# Patient Record
Sex: Female | Born: 1955 | Race: White | Hispanic: No | Marital: Married | Smoking: Former smoker
Health system: Southern US, Community
[De-identification: ages and names within clinical notes are randomized; demographics above are authoritative.]

## PROBLEM LIST (undated history)

## (undated) DIAGNOSIS — D649 Anemia, unspecified: Secondary | ICD-10-CM

## (undated) DIAGNOSIS — J45909 Unspecified asthma, uncomplicated: Secondary | ICD-10-CM

## (undated) DIAGNOSIS — I10 Essential (primary) hypertension: Secondary | ICD-10-CM

## (undated) DIAGNOSIS — K7460 Unspecified cirrhosis of liver: Secondary | ICD-10-CM

## (undated) DIAGNOSIS — J90 Pleural effusion, not elsewhere classified: Secondary | ICD-10-CM

## (undated) HISTORY — PX: TUBAL LIGATION: SHX77

---

## 2018-01-21 ENCOUNTER — Other Ambulatory Visit: Payer: Self-pay | Admitting: Internal Medicine

## 2018-01-21 DIAGNOSIS — Z1231 Encounter for screening mammogram for malignant neoplasm of breast: Secondary | ICD-10-CM

## 2018-01-21 DIAGNOSIS — Z0000 Encounter for general adult medical examination without abnormal findings: Secondary | ICD-10-CM

## 2018-02-17 ENCOUNTER — Other Ambulatory Visit: Payer: Self-pay | Admitting: Family Medicine

## 2018-02-17 DIAGNOSIS — R0602 Shortness of breath: Secondary | ICD-10-CM

## 2018-02-17 DIAGNOSIS — J90 Pleural effusion, not elsewhere classified: Secondary | ICD-10-CM

## 2018-02-17 DIAGNOSIS — R05 Cough: Secondary | ICD-10-CM

## 2018-02-17 DIAGNOSIS — R059 Cough, unspecified: Secondary | ICD-10-CM

## 2018-02-18 ENCOUNTER — Ambulatory Visit
Admission: RE | Admit: 2018-02-18 | Discharge: 2018-02-18 | Disposition: A | Discharge status: Home / Self Care | Payer: Medicare (Managed Care) | Source: Ambulatory Visit | Attending: Family Medicine | Admitting: Family Medicine

## 2018-02-18 DIAGNOSIS — R05 Cough: Secondary | ICD-10-CM | POA: Diagnosis present

## 2018-02-18 DIAGNOSIS — J90 Pleural effusion, not elsewhere classified: Secondary | ICD-10-CM | POA: Diagnosis not present

## 2018-02-18 DIAGNOSIS — R059 Cough, unspecified: Secondary | ICD-10-CM

## 2018-02-18 DIAGNOSIS — R0602 Shortness of breath: Secondary | ICD-10-CM | POA: Diagnosis present

## 2018-02-18 HISTORY — DX: Unspecified asthma, uncomplicated: J45.909

## 2018-02-18 MED ORDER — IOPAMIDOL (ISOVUE-300) INJECTION 61%
60.0000 mL | Freq: Once | INTRAVENOUS | Status: AC | PRN
  Administered 2018-02-18: 60 mL via INTRAVENOUS

## 2018-02-19 ENCOUNTER — Other Ambulatory Visit: Payer: Self-pay

## 2018-02-19 ENCOUNTER — Other Ambulatory Visit: Payer: Self-pay | Admitting: Family Medicine

## 2018-02-19 ENCOUNTER — Emergency Department
Admission: EM | Admit: 2018-02-19 | Discharge: 2018-02-20 | Disposition: A | Discharge status: Home / Self Care | Payer: Medicare (Managed Care) | Attending: Emergency Medicine | Admitting: Emergency Medicine

## 2018-02-19 ENCOUNTER — Ambulatory Visit
Admission: RE | Admit: 2018-02-19 | Discharge: 2018-02-19 | Disposition: A | Discharge status: Home / Self Care | Payer: Medicare (Managed Care) | Source: Ambulatory Visit | Attending: Interventional Radiology | Admitting: Interventional Radiology

## 2018-02-19 ENCOUNTER — Emergency Department: Payer: Medicare (Managed Care)

## 2018-02-19 ENCOUNTER — Encounter: Payer: Self-pay | Admitting: Emergency Medicine

## 2018-02-19 ENCOUNTER — Other Ambulatory Visit
Admission: RE | Admit: 2018-02-19 | Discharge: 2018-02-19 | Disposition: A | Discharge status: Home / Self Care | Payer: Medicare (Managed Care) | Source: Ambulatory Visit | Attending: Family Medicine | Admitting: Family Medicine

## 2018-02-19 ENCOUNTER — Ambulatory Visit
Admission: RE | Admit: 2018-02-19 | Discharge: 2018-02-19 | Disposition: A | Discharge status: Home / Self Care | Payer: Medicare (Managed Care) | Source: Ambulatory Visit | Attending: Family Medicine | Admitting: Family Medicine

## 2018-02-19 DIAGNOSIS — J90 Pleural effusion, not elsewhere classified: Secondary | ICD-10-CM

## 2018-02-19 DIAGNOSIS — I10 Essential (primary) hypertension: Secondary | ICD-10-CM | POA: Diagnosis not present

## 2018-02-19 DIAGNOSIS — J45909 Unspecified asthma, uncomplicated: Secondary | ICD-10-CM | POA: Diagnosis not present

## 2018-02-19 DIAGNOSIS — Z87891 Personal history of nicotine dependence: Secondary | ICD-10-CM | POA: Insufficient documentation

## 2018-02-19 DIAGNOSIS — R091 Pleurisy: Secondary | ICD-10-CM | POA: Diagnosis not present

## 2018-02-19 DIAGNOSIS — Z9889 Other specified postprocedural states: Secondary | ICD-10-CM | POA: Diagnosis present

## 2018-02-19 DIAGNOSIS — R079 Chest pain, unspecified: Secondary | ICD-10-CM | POA: Diagnosis present

## 2018-02-19 HISTORY — DX: Pleural effusion, not elsewhere classified: J90

## 2018-02-19 HISTORY — DX: Anemia, unspecified: D64.9

## 2018-02-19 HISTORY — DX: Essential (primary) hypertension: I10

## 2018-02-19 LAB — CBC WITH DIFFERENTIAL/PLATELET
Abs Immature Granulocytes: 0.02 10*3/uL (ref 0.00–0.07)
BASOS ABS: 0.1 10*3/uL (ref 0.0–0.1)
Basophils Relative: 1 %
Eosinophils Absolute: 0.2 10*3/uL (ref 0.0–0.5)
Eosinophils Relative: 3 %
HCT: 29.2 % — ABNORMAL LOW (ref 36.0–46.0)
Hemoglobin: 10 g/dL — ABNORMAL LOW (ref 12.0–15.0)
Immature Granulocytes: 0 %
Lymphocytes Relative: 7 %
Lymphs Abs: 0.5 10*3/uL — ABNORMAL LOW (ref 0.7–4.0)
MCH: 33.9 pg (ref 26.0–34.0)
MCHC: 34.2 g/dL (ref 30.0–36.0)
MCV: 99 fL (ref 80.0–100.0)
Monocytes Absolute: 0.6 10*3/uL (ref 0.1–1.0)
Monocytes Relative: 8 %
NEUTROS ABS: 6.1 10*3/uL (ref 1.7–7.7)
Neutrophils Relative %: 81 %
Platelets: 112 10*3/uL — ABNORMAL LOW (ref 150–400)
RBC: 2.95 MIL/uL — AB (ref 3.87–5.11)
RDW: 17.3 % — ABNORMAL HIGH (ref 11.5–15.5)
WBC: 7.5 10*3/uL (ref 4.0–10.5)
nRBC: 0 % (ref 0.0–0.2)

## 2018-02-19 LAB — TROPONIN I

## 2018-02-19 LAB — BILIRUBIN, TOTAL: Total Bilirubin: 2.8 mg/dL — ABNORMAL HIGH (ref 0.3–1.2)

## 2018-02-19 LAB — BASIC METABOLIC PANEL
ANION GAP: 5 (ref 5–15)
BUN: 19 mg/dL (ref 6–20)
CO2: 26 mmol/L (ref 22–32)
Calcium: 9.4 mg/dL (ref 8.9–10.3)
Chloride: 103 mmol/L (ref 98–111)
Creatinine, Ser: 1.6 mg/dL — ABNORMAL HIGH (ref 0.44–1.00)
GFR calc non Af Amer: 35 mL/min — ABNORMAL LOW (ref >60)
GFR, EST AFRICAN AMERICAN: 40 mL/min — AB (ref >60)
Glucose, Bld: 90 mg/dL (ref 70–99)
Potassium: 4.1 mmol/L (ref 3.5–5.1)
Sodium: 134 mmol/L — ABNORMAL LOW (ref 135–145)

## 2018-02-19 LAB — BODY FLUID CELL COUNT WITH DIFFERENTIAL
Eos, Fluid: 1 %
Lymphs, Fluid: 79 %
Monocyte-Macrophage-Serous Fluid: 9 %
Neutrophil Count, Fluid: 11 %
Total Nucleated Cell Count, Fluid: 301 cu mm

## 2018-02-19 LAB — ALBUMIN, PLEURAL OR PERITONEAL FLUID: Albumin, Fluid: 2.2 g/dL

## 2018-02-19 LAB — LACTATE DEHYDROGENASE, PLEURAL OR PERITONEAL FLUID: LD, Fluid: 95 U/L — ABNORMAL HIGH (ref 3–23)

## 2018-02-19 LAB — LACTATE DEHYDROGENASE: LDH: 173 U/L (ref 98–192)

## 2018-02-19 LAB — GLUCOSE, PLEURAL OR PERITONEAL FLUID: Glucose, Fluid: 95 mg/dL

## 2018-02-19 LAB — PROTEIN, PLEURAL OR PERITONEAL FLUID: Total protein, fluid: 3.3 g/dL

## 2018-02-19 LAB — PROTEIN, TOTAL: Total Protein: 7 g/dL (ref 6.5–8.1)

## 2018-02-19 LAB — ALBUMIN: Albumin: 3.3 g/dL — ABNORMAL LOW (ref 3.5–5.0)

## 2018-02-19 MED ORDER — FENTANYL CITRATE (PF) 100 MCG/2ML IJ SOLN: 50.0000 ug | Freq: Once | INTRAMUSCULAR | Status: DC

## 2018-02-19 NOTE — ED Notes (Signed)
Patient transported to X-ray 

## 2018-02-19 NOTE — ED Triage Notes (Signed)
Pt presents from home via [facility] with c/o pain at site of incision site of left sided thoracentesis that was performed earlier today. Pt states pain became unbearable at home and she had to call 911. No redness noted to area around incision site, but area is very tender to touch. Pt unable to roll onto left side due to pain. VSS for ems, BS 126. Pt currently a&o x4 at this time. Pt denies any shortness of breath at this time. Pt has hx of anemia and pleural effusion.

## 2018-02-19 NOTE — ED Provider Notes (Signed)
[facility] Emergency Department Provider Note  ____________________________________________   First MD Initiated Contact with Patient 02/19/18 2154     (approximate)  I have reviewed the triage vital signs and the nursing notes.   HISTORY  Chief Complaint Chest Pain and Post-op Problem   HPI Kelly Mclean is a 63 y.o. female with a history of hypertension as well as a pleural effusion who had a thoracentesis earlier today.  She says that several hours after having the thoracentesis she began having sharp left-sided thoracic pain.  She says that she thinks that the pain medication is wearing off.  Says that it hurts worse to lie on that side as well as take a deep breath.  Does the pain is a 4-5 out of 10 at this time.  Says that she is not sure yet why the effusion is there.  Says that this was the purpose of the thoracentesis.   Past Medical History:  Diagnosis Date  . Anemia   . Asthma   . Hypertension   . Pleural effusion     There are no active problems to display for this patient.   Past Surgical History:  Procedure Laterality Date  . TUBAL LIGATION      Prior to Admission medications   Not on File    Allergies Penicillins  History reviewed. No pertinent family history.  Social History Social History   Tobacco Use  . Smoking status: Former Smoker    Last attempt to quit: 1990    Years since quitting: 30.0  . Smokeless tobacco: Never Used  Substance Use Topics  . Alcohol use: Not Currently  . Drug use: Not Currently    Review of Systems  Constitutional: No fever/chills Eyes: No visual changes. ENT: No sore throat. Cardiovascular: As above Respiratory: As above Gastrointestinal: No abdominal pain.  No nausea, no vomiting.  No diarrhea.  No constipation. Genitourinary: Negative for dysuria. Musculoskeletal: Negative for back pain. Skin: Negative for rash. Neurological: Negative for headaches, focal weakness or  numbness.   ____________________________________________   PHYSICAL EXAM:  VITAL SIGNS: ED Triage Vitals  Enc Vitals Group     BP --      Pulse --      Resp --      Temp 02/19/18 2145 98.1 F (36.7 C)     Temp Source 02/19/18 2145 Oral     SpO2 --      Weight 02/19/18 2146 160 lb (72.6 kg)     Height 02/19/18 2146 5' 3"  (1.6 m)     Head Circumference --      Peak Flow --      Pain Score 02/19/18 2145 5     Pain Loc --      Pain Edu? --      Excl. in [place]? --     Constitutional: Alert and oriented.  No acute distress.  Lying on her right side to avoid lying on the left hemithorax. Eyes: Conjunctivae are normal.  Head: Atraumatic. Nose: No congestion/rhinnorhea. Mouth/Throat: Mucous membranes are moist.  Neck: No stridor.   Cardiovascular: Normal rate, regular rhythm. Grossly normal heart sounds.  Respiratory: Normal respiratory effort.  No retractions. Lungs CTAB.  Left thoracentesis site with punctate needle mark without any surrounding tenderness to palpation, erythema, exudate or fluctuance.  No surrounding bruising.  Bandages CDI. Gastrointestinal: Soft and nontender. No distention.  Musculoskeletal: No lower extremity tenderness nor edema.  No joint effusions. Neurologic:  Normal speech and language.  No gross focal neurologic deficits are appreciated. Skin:  Skin is warm, dry and intact. No rash noted. Psychiatric: Mood and affect are normal. Speech and behavior are normal.  ____________________________________________   LABS (all labs ordered are listed, but only abnormal results are displayed)  Labs Reviewed  CBC WITH DIFFERENTIAL/PLATELET - Abnormal; Notable for the following components:      Result Value   RBC 2.95 (*)    Hemoglobin 10.0 (*)    HCT 29.2 (*)    RDW 17.3 (*)    Platelets 112 (*)    Lymphs Abs 0.5 (*)    All other components within normal limits  BASIC METABOLIC PANEL - Abnormal; Notable for the following components:   Sodium 134 (*)     Creatinine, Ser 1.60 (*)    GFR calc non Af Amer 35 (*)    GFR calc Af Amer 40 (*)    All other components within normal limits  TROPONIN I   ____________________________________________  EKG  ED ECG REPORT I, Doran Stabler, the attending physician, personally viewed and interpreted this ECG.   Date: 02/19/2018  EKG Time: 2148  Rate: 69  Rhythm: normal sinus rhythm  Axis: Normal  Intervals:none  ST&T Change: No ST segment elevation or depression.  No abnormal T wave inversion.  ____________________________________________  RADIOLOGY  Chest x-ray left-sided pleural effusion with increased hazy attenuation of the left midlung zone which may be due to layering of the effusion posteriorly or increased in effusion size. ____________________________________________   PROCEDURES  Procedure(s) performed:   Procedures  Critical Care performed:   ____________________________________________   INITIAL IMPRESSION / ASSESSMENT AND PLAN / ED COURSE  Pertinent labs & imaging results that were available during my care of the patient were reviewed by me and considered in my medical decision making (see chart for details).  Differential diagnosis includes, but is not limited to, ACS, aortic dissection, pulmonary embolism, cardiac tamponade, pneumothorax, pneumonia, pericarditis, myocarditis, GI-related causes including esophagitis/gastritis, and musculoskeletal chest wall pain.   As part of my medical decision making, I reviewed the following data within the [facility] records from procedure earlier today as well as outpatient notes.  ----------------------------------------- 10:28 PM on 02/19/2018 -----------------------------------------  Discussed case with Dr. Raul Del.  We reviewed the x-ray as well as the pleural fluid results.  Dr. Raul Del does not recommend any acute intervention at this time beyond pain control.  Recommends following up as  planned with the patient's primary care doctor at the [facility].  Also recommends follow-up with pulmonology.  ----------------------------------------- 10:51 PM on 02/19/2018 -----------------------------------------  Discussed results as well as discharge with patient.  She is saying that she is unable to get out of bed because of the pain.  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  She says that her husband is unable to take care of her at home as well and she would like to seek consultation with the social worker to explore options such as home care or rehab placement.  Chest pain is likely pleurisy status post thoracentesis.  No acute issue identified on the work-up at this time. ____________________________________________   FINAL CLINICAL IMPRESSION(S) / ED DIAGNOSES  Pleurisy.  NEW MEDICATIONS STARTED DURING THIS VISIT:  New Prescriptions   No medications on file     Note:  This document was prepared using Dragon voice recognition software  and may include unintentional dictation errors.     Orbie Pyo, MD 02/19/18 [phone]

## 2018-02-19 NOTE — ED Notes (Signed)
Pt informed this RN of hx of drug and alcohol abuse. Pt requesting not to be given narcotics due to hx. MD aware.

## 2018-02-20 ENCOUNTER — Emergency Department: Payer: Medicare (Managed Care)

## 2018-02-20 LAB — PH, BODY FLUID: pH, Body Fluid: 7.7

## 2018-02-20 LAB — CYTOLOGY - NON PAP

## 2018-02-20 MED ORDER — LIDOCAINE 5 % EX PTCH: 1.0000 | MEDICATED_PATCH | Freq: Two times a day (BID) | CUTANEOUS | 2 refills | Status: DC

## 2018-02-20 MED ORDER — ATENOLOL 25 MG PO TABS
12.5000 mg | ORAL_TABLET | Freq: Every day | ORAL | Status: DC
  Filled 2018-02-20: qty 1

## 2018-02-20 MED ORDER — BUSPIRONE HCL 5 MG PO TABS
10.0000 mg | ORAL_TABLET | Freq: Three times a day (TID) | ORAL | Status: DC
  Administered 2018-02-20: 10 mg via ORAL
  Filled 2018-02-20: qty 2

## 2018-02-20 MED ORDER — CALCIUM CARBONATE ANTACID 500 MG PO CHEW
2.0000 | CHEWABLE_TABLET | Freq: Two times a day (BID) | ORAL | Status: DC
  Administered 2018-02-20 (×2): 400 mg via ORAL
  Filled 2018-02-20 (×3): qty 2

## 2018-02-20 MED ORDER — PRIMIDONE 50 MG PO TABS
50.0000 mg | ORAL_TABLET | Freq: Two times a day (BID) | ORAL | Status: DC
  Administered 2018-02-20 (×2): 50 mg via ORAL
  Filled 2018-02-20 (×4): qty 1

## 2018-02-20 MED ORDER — VITAMIN B-12 1000 MCG PO TABS
1000.0000 ug | ORAL_TABLET | Freq: Every day | ORAL | Status: DC
  Administered 2018-02-20: 1000 ug via ORAL
  Filled 2018-02-20: qty 1

## 2018-02-20 MED ORDER — VITAMIN D 25 MCG (1000 UNIT) PO TABS
1000.0000 [IU] | ORAL_TABLET | Freq: Every day | ORAL | Status: DC
  Administered 2018-02-20: 1000 [IU] via ORAL
  Filled 2018-02-20: qty 1

## 2018-02-20 MED ORDER — FERROUS SULFATE 325 (65 FE) MG PO TABS
325.0000 mg | ORAL_TABLET | Freq: Two times a day (BID) | ORAL | Status: DC
  Administered 2018-02-20 (×2): 325 mg via ORAL
  Filled 2018-02-20 (×4): qty 1

## 2018-02-20 MED ORDER — GABAPENTIN 100 MG PO CAPS
200.0000 mg | ORAL_CAPSULE | Freq: Every day | ORAL | Status: DC
  Administered 2018-02-20: 200 mg via ORAL
  Filled 2018-02-20 (×3): qty 2

## 2018-02-20 MED ORDER — FAMOTIDINE 20 MG PO TABS
40.0000 mg | ORAL_TABLET | Freq: Every day | ORAL | Status: DC
  Administered 2018-02-20: 40 mg via ORAL
  Filled 2018-02-20 (×2): qty 2

## 2018-02-20 MED ORDER — MONTELUKAST SODIUM 10 MG PO TABS
10.0000 mg | ORAL_TABLET | Freq: Every day | ORAL | Status: DC
  Administered 2018-02-20: 10 mg via ORAL
  Filled 2018-02-20 (×3): qty 1

## 2018-02-20 MED ORDER — LISINOPRIL 5 MG PO TABS
2.5000 mg | ORAL_TABLET | Freq: Every day | ORAL | Status: DC
  Administered 2018-02-20: 2.5 mg via ORAL
  Filled 2018-02-20: qty 1
  Filled 2018-02-20: qty 0.5

## 2018-02-20 MED ORDER — CITALOPRAM HYDROBROMIDE 20 MG PO TABS
20.0000 mg | ORAL_TABLET | Freq: Every day | ORAL | Status: DC
  Administered 2018-02-20: 20 mg via ORAL
  Filled 2018-02-20: qty 1

## 2018-02-20 NOTE — ED Notes (Signed)
Pt assisted up to commode with henry, rn.

## 2018-02-20 NOTE — Clinical Social Work Note (Signed)
Clinical Education officer, museum (CSW) received consult for "Nursing home placement." CSW staffed with EDP Dr. Cinda Quest. CSW met with patient at bedside, along with EDP. Patient from home and lives with husband. Per notes, patient has [facility] services with [facility]. Patient states she receives in home services from "[facility]" on Tuesdays and Thursdays. EDP to complete chest x-ray.   UPDATE: Patient to discharge home. RNCM Jeanna following patient for discharge needs. Per notes, [facility] provides home care aides on Mondays and Fridays for 4 hours per day, patient goes to [facility] on Tuesdays and Thursdays. [facility] to coordinate outpatient services and provide transport to home. CSW signing off as no further Social Work needs identified.   Kelly Mclean, Latanya Presser, [place] Worker-Emergency Department [phone]

## 2018-02-20 NOTE — Evaluation (Signed)
Physical Therapy Evaluation Patient Details Name: Kelly Mclean MRN: [id number] DOB: 12-16-1955 Today's Date: 02/20/2018   History of Present Illness  Patient is a 63 year old female admitted from home with left flank pain. Patient had thoracentesis earlier in the day. PMH to include HTN, pleural effusion.    Clinical Impression  Patient received in bed, reports she is very tired. Reports no pain while lying still on her right side. With mobility pain increases to moderate level. Patient requires increased time to perform all mobility activities. Patient requires min assist with supine to sit to raise trunk from bed. Patient then able to ambulate 30 feet in the room without AD, min guard. Patient has significantly decreased cadence. She will benefit from continued skilled PT to improve strength, functional independence and to decrease pain.     Follow Up Recommendations SNF;Home health PT(dependet on progression)    Equipment Recommendations  None recommended by PT    Recommendations for Other Services       Precautions / Restrictions Precautions Precautions: None      Mobility  Bed Mobility Overal bed mobility: Needs Assistance Bed Mobility: Sidelying to Sit   Sidelying to sit: Min assist       General bed mobility comments: requires assist for raising trunk to sitting position.   Transfers Overall transfer level: Needs assistance Equipment used: None Transfers: Sit to/from Stand Sit to Stand: Min guard            Ambulation/Gait Ambulation/Gait assistance: Modified independent (Device/Increase time);Min guard Gait Distance (Feet): 30 Feet Assistive device: None Gait Pattern/deviations: Step-to pattern;Shuffle Gait velocity: decreased      Stairs            Wheelchair Mobility    Modified Rankin (Stroke Patients Only)       Balance Overall balance assessment: Modified Independent;Mild deficits observed, not formally tested                                            Pertinent Vitals/Pain Pain Assessment: 0-10 Pain Score: 8  Pain Location: left side Pain Descriptors / Indicators: Sharp;Jabbing Pain Intervention(s): Limited activity within patient's tolerance;Monitored during session    Home Living Family/patient expects to be discharged to:: Private residence Living Arrangements: Spouse/significant other   Type of Home: House Home Access: Level entry     Home Layout: One level Home Equipment: Environmental consultant - 4 wheels Additional Comments: reports she has a walker but does not use it    Prior Function                 Hand Dominance        Extremity/Trunk Assessment   Upper Extremity Assessment Upper Extremity Assessment: Generalized weakness    Lower Extremity Assessment Lower Extremity Assessment: Generalized weakness       Communication   Communication: No difficulties  Cognition Arousal/Alertness: Awake/alert Behavior During Therapy: WFL for tasks assessed/performed Overall Cognitive Status: Within Functional Limits for tasks assessed                                        General Comments      Exercises     Assessment/Plan    PT Assessment Patient needs continued PT services  PT Problem List Decreased strength;Decreased activity tolerance;Decreased mobility;Pain  PT Treatment Interventions Therapeutic exercise;Gait training;Therapeutic activities;Patient/family education;Functional mobility training    PT Goals (Current goals can be found in the Care Plan section)  Acute Rehab PT Goals Patient Stated Goal: to decrease pain PT Goal Formulation: With patient Time For Goal Achievement: 02/27/18 Potential to Achieve Goals: Good    Frequency Min 2X/week   Barriers to discharge Decreased caregiver support reports her husband is at work, although mentions that the [facility] program could come get her if she is discharged. Reports she would like help  especially at night, therefore she may need SNF placement    Co-evaluation               AM-PAC PT "6 Clicks" Mobility  Outcome Measure Help needed turning from your back to your side while in a flat bed without using bedrails?: A Lot Help needed moving from lying on your back to sitting on the side of a flat bed without using bedrails?: A Little Help needed moving to and from a bed to a chair (including a wheelchair)?: A Little Help needed standing up from a chair using your arms (e.g., wheelchair or bedside chair)?: A Little Help needed to walk in hospital room?: A Little Help needed climbing 3-5 steps with a railing? : A Little 6 Click Score: 17    End of Session Equipment Utilized During Treatment: Gait belt Activity Tolerance: Patient limited by pain;Patient limited by lethargy Patient left: in bed;with call bell/phone within reach Nurse Communication: Mobility status PT Visit Diagnosis: Other abnormalities of gait and mobility (R26.89);Muscle weakness (generalized) (M62.81);Pain Pain - Right/Left: Left Pain - part of body: (side)    Time: 3437-3578 PT Time Calculation (min) (ACUTE ONLY): 24 min   Charges:   PT Evaluation $PT Eval Low Complexity: 1 Low PT Treatments $Gait Training: 8-22 mins        Naidelin Gugliotta, PT, GCS 02/20/18,10:29 AM

## 2018-02-20 NOTE — ED Notes (Signed)
This RN called [facility] transport to which the staff member stated that he was not aware that the pt was ready to be picked up. Staff member to send someone shortly to retrieve pt.

## 2018-02-20 NOTE — ED Notes (Signed)
Pharmacy called for 00:30 meds.

## 2018-02-20 NOTE — ED Provider Notes (Signed)
-----------------------------------------   5:46 AM on 02/20/2018 -----------------------------------------   Blood pressure (!) 120/42, pulse 77, temperature 98.1 F (36.7 C), temperature source Oral, resp. rate (!) 22, height 1.6 m (5' 3" ), weight 72.6 kg, SpO2 98 %.  The patient is calm and cooperative at this time.  There have been no acute events since the last update.  Awaiting disposition plan from PT/OT and social work.   Hinda Kehr, MD 02/20/18 [phone]

## 2018-02-20 NOTE — Discharge Instructions (Signed)
Repeat x-ray shows everything looks fairly good.  Not sure what is causing the pain.  He can try the Lidoderm patch.  Please follow-up with your regular doctor in the next few days to help keep an eye on this pleurisy that you appear to be having.  Please return here if you get worse pain fever shortness of breath or any other problems.

## 2018-02-20 NOTE — ED Notes (Signed)
Pt dressed and ready to be transported to [facility]. [facility] to come and pick pt up.

## 2018-02-20 NOTE — ED Notes (Signed)
Pt moved to hospital bed. Call bell at right side. Water and purse at bedside. Pt denies other needs.

## 2018-02-20 NOTE — ED Notes (Signed)
Report from annie, rn.

## 2018-02-20 NOTE — ED Notes (Signed)
dorletha from [facility] came and picked up the patient.  Instructions given.  Unable to bring chl up in room, so she did not sign.  Patient is up in wheelchair and ready to go.

## 2018-02-20 NOTE — Care Management Note (Signed)
Case Management Note  Patient Details  Name: Kelly Mclean MRN: [id number] Date of Birth: July 21, 1955  Subjective/Objective:      Patient is being seen in the ED for pleuritic chest pain and shortness of breath.  Patient has a thoracentesis and has been cleared for discharge.  Patient reports that she moved from [place] in March of 2019 in [place] she was part of the [facility] program.  Patient is part of the [facility] program now at [employer].  Patient lives with her husband.  [facility] provides home care aides on Mondays and Fridays for 4 hours per day, patient goes to [facility] on Tuesdays and Thursdays.  Patient receives all of her prescriptions and PCP services from [facility] and they provide her transportation.  RNCM contacted [facility] and notified them of patient's visit and that she will be discharged.  [facility] will send transport to pick patient up and carry her home.  Patients husband is currently at work.  Patient reports that she has a history of arthritis, cervical dystonia, fibromyalgia and IBS.  Patient has a rollator at home but reports she very rarely uses it.  [facility] will provide all outpatient needed services.  RNCM signed off. Doran Clay RN BSN [phone]               Action/Plan:   Expected Discharge Date:                  Expected Discharge Plan:  [facility])  In-House Referral:  Clinical Social Work  Discharge planning Services  CM Consult  Post Acute Care Choice:    Choice offered to:     DME Arranged:    DME Agency:     HH Arranged:  ([facility]) [place] Agency:     Status of Service:  Completed, signed off  If discussed at [employer] of Stay Meetings, dates discussed:    Additional Comments:  Shelbie Hutching, RN 02/20/2018, 11:57 AM

## 2018-02-20 NOTE — ED Notes (Signed)
Physical therapy at bedside

## 2018-02-20 NOTE — ED Notes (Signed)
Charge rn notified of need for hospital bed.

## 2018-02-20 NOTE — ED Provider Notes (Signed)
Patient reports pleuritic chest pain radiating from about the mid axillary line over toward the anterior chest under her breast on the left side.  He got worse after she had the thoracentesis.  She has had it before.  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  sHe says she cannot take Tylenol she is not sure why or NSAIDs.  I will attempt to give her a Lidoderm patch.  She says she had that before for hip pain and seem to work well.  We will repeat the chest x-ray now to make sure she has not developed a pneumothorax.  If that is negative we will give her the Lidoderm patch and we will try let her go.   Nena Polio, MD 02/20/18 1019

## 2018-02-20 NOTE — ED Notes (Signed)
Report to [place], rn.

## 2018-02-21 ENCOUNTER — Other Ambulatory Visit: Payer: Self-pay | Admitting: Family Medicine

## 2018-02-21 DIAGNOSIS — K7460 Unspecified cirrhosis of liver: Secondary | ICD-10-CM

## 2018-02-23 LAB — BODY FLUID CULTURE
Culture: NO GROWTH
Gram Stain: NONE SEEN

## 2018-02-26 LAB — TOTAL BILIRUBIN, BODY FLUID: Total bilirubin, fluid: 1.3 mg/dL

## 2018-03-04 ENCOUNTER — Ambulatory Visit
Admission: RE | Admit: 2018-03-04 | Discharge: 2018-03-04 | Disposition: A | Discharge status: Home / Self Care | Payer: Medicare (Managed Care) | Source: Ambulatory Visit | Attending: Family Medicine | Admitting: Family Medicine

## 2018-03-04 DIAGNOSIS — K7460 Unspecified cirrhosis of liver: Secondary | ICD-10-CM | POA: Insufficient documentation

## 2018-03-05 ENCOUNTER — Other Ambulatory Visit: Payer: Self-pay | Admitting: Internal Medicine

## 2018-03-05 ENCOUNTER — Other Ambulatory Visit (HOSPITAL_COMMUNITY): Payer: Self-pay | Admitting: Internal Medicine

## 2018-03-05 DIAGNOSIS — J90 Pleural effusion, not elsewhere classified: Secondary | ICD-10-CM

## 2018-03-06 ENCOUNTER — Other Ambulatory Visit: Payer: Self-pay | Admitting: Internal Medicine

## 2018-03-06 ENCOUNTER — Ambulatory Visit
Admission: RE | Admit: 2018-03-06 | Discharge: 2018-03-06 | Disposition: A | Discharge status: Home / Self Care | Payer: Medicare (Managed Care) | Source: Ambulatory Visit | Attending: Internal Medicine | Admitting: Internal Medicine

## 2018-03-06 ENCOUNTER — Ambulatory Visit
Admission: RE | Admit: 2018-03-06 | Discharge: 2018-03-06 | Disposition: A | Discharge status: Home / Self Care | Payer: Medicare (Managed Care) | Source: Ambulatory Visit | Attending: Interventional Radiology | Admitting: Interventional Radiology

## 2018-03-06 DIAGNOSIS — K8020 Calculus of gallbladder without cholecystitis without obstruction: Secondary | ICD-10-CM | POA: Insufficient documentation

## 2018-03-06 DIAGNOSIS — J90 Pleural effusion, not elsewhere classified: Secondary | ICD-10-CM | POA: Insufficient documentation

## 2018-03-06 DIAGNOSIS — D378 Neoplasm of uncertain behavior of other specified digestive organs: Secondary | ICD-10-CM

## 2018-03-06 DIAGNOSIS — K5710 Diverticulosis of small intestine without perforation or abscess without bleeding: Secondary | ICD-10-CM | POA: Diagnosis not present

## 2018-03-06 DIAGNOSIS — K8689 Other specified diseases of pancreas: Secondary | ICD-10-CM

## 2018-03-06 DIAGNOSIS — Z9889 Other specified postprocedural states: Secondary | ICD-10-CM | POA: Insufficient documentation

## 2018-03-06 MED ORDER — IOHEXOL 300 MG/ML  SOLN
100.0000 mL | Freq: Once | INTRAMUSCULAR | Status: AC | PRN
  Administered 2018-03-06: 100 mL via INTRAVENOUS

## 2018-03-06 NOTE — Procedures (Signed)
Pre procedural Dx: Symptomatic Pleural effusion Post procedural Dx: Same  Successful US guided left sided thoracentesis yielding 1.9 L of serous, amber colored pleural fluid.   Samples sent to lab for analysis.  EBL: None  Complications: None immediate.  Ronny Bacon, MD Pager #: [phone]

## 2018-03-06 NOTE — Progress Notes (Signed)
Asked to come and evaluate this radiology patient post thoracentesis per Dr Pascal Lux. Hr within normal limits. Patient alert and oriented. bp stable , warm and dry, not diaphoretic. Audible lung sounds unchanged. No rales nor rhonchi audible. Consulted with DR Pascal Lux, with CXR pending.

## 2018-03-11 ENCOUNTER — Ambulatory Visit
Admission: RE | Admit: 2018-03-11 | Discharge: 2018-03-11 | Disposition: A | Discharge status: Home / Self Care | Payer: Medicare (Managed Care) | Source: Ambulatory Visit | Attending: Internal Medicine | Admitting: Internal Medicine

## 2018-03-11 DIAGNOSIS — J9601 Acute respiratory failure with hypoxia: Secondary | ICD-10-CM | POA: Diagnosis present

## 2018-03-11 DIAGNOSIS — K766 Portal hypertension: Secondary | ICD-10-CM | POA: Diagnosis present

## 2018-03-11 DIAGNOSIS — Z66 Do not resuscitate: Secondary | ICD-10-CM | POA: Diagnosis present

## 2018-03-11 DIAGNOSIS — J918 Pleural effusion in other conditions classified elsewhere: Secondary | ICD-10-CM | POA: Diagnosis present

## 2018-03-11 DIAGNOSIS — Z87891 Personal history of nicotine dependence: Secondary | ICD-10-CM

## 2018-03-11 DIAGNOSIS — D696 Thrombocytopenia, unspecified: Secondary | ICD-10-CM | POA: Diagnosis present

## 2018-03-11 DIAGNOSIS — R0600 Dyspnea, unspecified: Secondary | ICD-10-CM | POA: Diagnosis not present

## 2018-03-11 DIAGNOSIS — Z880 Allergy status to penicillin: Secondary | ICD-10-CM

## 2018-03-11 DIAGNOSIS — Z881 Allergy status to other antibiotic agents status: Secondary | ICD-10-CM

## 2018-03-11 DIAGNOSIS — J45909 Unspecified asthma, uncomplicated: Secondary | ICD-10-CM | POA: Diagnosis present

## 2018-03-11 DIAGNOSIS — Z1231 Encounter for screening mammogram for malignant neoplasm of breast: Secondary | ICD-10-CM

## 2018-03-11 DIAGNOSIS — D72820 Lymphocytosis (symptomatic): Secondary | ICD-10-CM | POA: Diagnosis present

## 2018-03-11 DIAGNOSIS — N184 Chronic kidney disease, stage 4 (severe): Secondary | ICD-10-CM | POA: Diagnosis present

## 2018-03-11 DIAGNOSIS — J9811 Atelectasis: Secondary | ICD-10-CM | POA: Diagnosis present

## 2018-03-11 DIAGNOSIS — N179 Acute kidney failure, unspecified: Secondary | ICD-10-CM | POA: Diagnosis not present

## 2018-03-11 DIAGNOSIS — I959 Hypotension, unspecified: Secondary | ICD-10-CM | POA: Diagnosis not present

## 2018-03-11 DIAGNOSIS — K8020 Calculus of gallbladder without cholecystitis without obstruction: Secondary | ICD-10-CM | POA: Diagnosis present

## 2018-03-11 DIAGNOSIS — T502X5A Adverse effect of carbonic-anhydrase inhibitors, benzothiadiazides and other diuretics, initial encounter: Secondary | ICD-10-CM | POA: Diagnosis not present

## 2018-03-11 DIAGNOSIS — K7460 Unspecified cirrhosis of liver: Principal | ICD-10-CM | POA: Diagnosis present

## 2018-03-11 DIAGNOSIS — I129 Hypertensive chronic kidney disease with stage 1 through stage 4 chronic kidney disease, or unspecified chronic kidney disease: Secondary | ICD-10-CM | POA: Diagnosis present

## 2018-03-11 DIAGNOSIS — E875 Hyperkalemia: Secondary | ICD-10-CM | POA: Diagnosis present

## 2018-03-11 DIAGNOSIS — X58XXXA Exposure to other specified factors, initial encounter: Secondary | ICD-10-CM | POA: Diagnosis not present

## 2018-03-11 DIAGNOSIS — D638 Anemia in other chronic diseases classified elsewhere: Secondary | ICD-10-CM | POA: Diagnosis present

## 2018-03-11 DIAGNOSIS — J948 Other specified pleural conditions: Secondary | ICD-10-CM | POA: Diagnosis present

## 2018-03-11 DIAGNOSIS — Z0000 Encounter for general adult medical examination without abnormal findings: Secondary | ICD-10-CM

## 2018-03-11 DIAGNOSIS — R188 Other ascites: Secondary | ICD-10-CM | POA: Diagnosis present

## 2018-03-11 DIAGNOSIS — F329 Major depressive disorder, single episode, unspecified: Secondary | ICD-10-CM | POA: Diagnosis present

## 2018-03-11 DIAGNOSIS — F419 Anxiety disorder, unspecified: Secondary | ICD-10-CM | POA: Diagnosis present

## 2018-03-11 DIAGNOSIS — K7581 Nonalcoholic steatohepatitis (NASH): Secondary | ICD-10-CM | POA: Diagnosis present

## 2018-03-11 DIAGNOSIS — E739 Lactose intolerance, unspecified: Secondary | ICD-10-CM | POA: Diagnosis present

## 2018-03-11 DIAGNOSIS — Z79899 Other long term (current) drug therapy: Secondary | ICD-10-CM

## 2018-03-11 DIAGNOSIS — Z803 Family history of malignant neoplasm of breast: Secondary | ICD-10-CM

## 2018-03-14 ENCOUNTER — Inpatient Hospital Stay: Payer: Medicare (Managed Care)

## 2018-03-14 ENCOUNTER — Inpatient Hospital Stay
Admission: EM | Admit: 2018-03-14 | Discharge: 2018-03-28 | DRG: 432 | Disposition: A | Discharge status: Other Acute Inpatient Hospital | Payer: Medicare (Managed Care) | Attending: Internal Medicine | Admitting: Internal Medicine

## 2018-03-14 ENCOUNTER — Emergency Department: Payer: Medicare (Managed Care)

## 2018-03-14 ENCOUNTER — Other Ambulatory Visit: Payer: Self-pay

## 2018-03-14 DIAGNOSIS — R0600 Dyspnea, unspecified: Secondary | ICD-10-CM

## 2018-03-14 DIAGNOSIS — K7031 Alcoholic cirrhosis of liver with ascites: Secondary | ICD-10-CM

## 2018-03-14 DIAGNOSIS — K7581 Nonalcoholic steatohepatitis (NASH): Secondary | ICD-10-CM | POA: Diagnosis present

## 2018-03-14 DIAGNOSIS — R899 Unspecified abnormal finding in specimens from other organs, systems and tissues: Secondary | ICD-10-CM

## 2018-03-14 DIAGNOSIS — F419 Anxiety disorder, unspecified: Secondary | ICD-10-CM | POA: Diagnosis present

## 2018-03-14 DIAGNOSIS — R188 Other ascites: Secondary | ICD-10-CM | POA: Diagnosis present

## 2018-03-14 DIAGNOSIS — R0603 Acute respiratory distress: Secondary | ICD-10-CM

## 2018-03-14 DIAGNOSIS — J948 Other specified pleural conditions: Secondary | ICD-10-CM | POA: Diagnosis present

## 2018-03-14 DIAGNOSIS — E739 Lactose intolerance, unspecified: Secondary | ICD-10-CM | POA: Diagnosis present

## 2018-03-14 DIAGNOSIS — N179 Acute kidney failure, unspecified: Secondary | ICD-10-CM | POA: Diagnosis not present

## 2018-03-14 DIAGNOSIS — Z880 Allergy status to penicillin: Secondary | ICD-10-CM | POA: Diagnosis not present

## 2018-03-14 DIAGNOSIS — Z1231 Encounter for screening mammogram for malignant neoplasm of breast: Secondary | ICD-10-CM | POA: Diagnosis not present

## 2018-03-14 DIAGNOSIS — X58XXXA Exposure to other specified factors, initial encounter: Secondary | ICD-10-CM | POA: Diagnosis not present

## 2018-03-14 DIAGNOSIS — R0602 Shortness of breath: Secondary | ICD-10-CM

## 2018-03-14 DIAGNOSIS — K766 Portal hypertension: Secondary | ICD-10-CM | POA: Diagnosis present

## 2018-03-14 DIAGNOSIS — J9601 Acute respiratory failure with hypoxia: Secondary | ICD-10-CM | POA: Diagnosis present

## 2018-03-14 DIAGNOSIS — J9811 Atelectasis: Secondary | ICD-10-CM | POA: Diagnosis present

## 2018-03-14 DIAGNOSIS — Z9889 Other specified postprocedural states: Secondary | ICD-10-CM

## 2018-03-14 DIAGNOSIS — N184 Chronic kidney disease, stage 4 (severe): Secondary | ICD-10-CM | POA: Diagnosis present

## 2018-03-14 DIAGNOSIS — J918 Pleural effusion in other conditions classified elsewhere: Secondary | ICD-10-CM | POA: Diagnosis present

## 2018-03-14 DIAGNOSIS — Z87891 Personal history of nicotine dependence: Secondary | ICD-10-CM | POA: Diagnosis not present

## 2018-03-14 DIAGNOSIS — D72820 Lymphocytosis (symptomatic): Secondary | ICD-10-CM | POA: Diagnosis not present

## 2018-03-14 DIAGNOSIS — N183 Chronic kidney disease, stage 3 (moderate): Secondary | ICD-10-CM | POA: Diagnosis not present

## 2018-03-14 DIAGNOSIS — J9600 Acute respiratory failure, unspecified whether with hypoxia or hypercapnia: Secondary | ICD-10-CM | POA: Diagnosis present

## 2018-03-14 DIAGNOSIS — R0902 Hypoxemia: Secondary | ICD-10-CM

## 2018-03-14 DIAGNOSIS — I959 Hypotension, unspecified: Secondary | ICD-10-CM | POA: Diagnosis not present

## 2018-03-14 DIAGNOSIS — D696 Thrombocytopenia, unspecified: Secondary | ICD-10-CM | POA: Diagnosis not present

## 2018-03-14 DIAGNOSIS — K769 Liver disease, unspecified: Secondary | ICD-10-CM | POA: Diagnosis not present

## 2018-03-14 DIAGNOSIS — D649 Anemia, unspecified: Secondary | ICD-10-CM | POA: Diagnosis not present

## 2018-03-14 DIAGNOSIS — T502X5A Adverse effect of carbonic-anhydrase inhibitors, benzothiadiazides and other diuretics, initial encounter: Secondary | ICD-10-CM | POA: Diagnosis not present

## 2018-03-14 DIAGNOSIS — J90 Pleural effusion, not elsewhere classified: Secondary | ICD-10-CM

## 2018-03-14 DIAGNOSIS — J45909 Unspecified asthma, uncomplicated: Secondary | ICD-10-CM | POA: Diagnosis present

## 2018-03-14 DIAGNOSIS — I129 Hypertensive chronic kidney disease with stage 1 through stage 4 chronic kidney disease, or unspecified chronic kidney disease: Secondary | ICD-10-CM | POA: Diagnosis present

## 2018-03-14 DIAGNOSIS — F329 Major depressive disorder, single episode, unspecified: Secondary | ICD-10-CM | POA: Diagnosis present

## 2018-03-14 DIAGNOSIS — Z803 Family history of malignant neoplasm of breast: Secondary | ICD-10-CM | POA: Diagnosis not present

## 2018-03-14 DIAGNOSIS — Z881 Allergy status to other antibiotic agents status: Secondary | ICD-10-CM | POA: Diagnosis not present

## 2018-03-14 DIAGNOSIS — E875 Hyperkalemia: Secondary | ICD-10-CM | POA: Diagnosis present

## 2018-03-14 DIAGNOSIS — K7460 Unspecified cirrhosis of liver: Secondary | ICD-10-CM | POA: Diagnosis present

## 2018-03-14 DIAGNOSIS — Z79899 Other long term (current) drug therapy: Secondary | ICD-10-CM | POA: Diagnosis not present

## 2018-03-14 HISTORY — DX: Unspecified cirrhosis of liver: K74.60

## 2018-03-14 LAB — PROTEIN, PLEURAL OR PERITONEAL FLUID: Total protein, fluid: <3 g/dL

## 2018-03-14 LAB — CBC
HCT: 29.9 % — ABNORMAL LOW (ref 36.0–46.0)
Hemoglobin: 10.1 g/dL — ABNORMAL LOW (ref 12.0–15.0)
MCH: 33.9 pg (ref 26.0–34.0)
MCHC: 33.8 g/dL (ref 30.0–36.0)
MCV: 100.3 fL — ABNORMAL HIGH (ref 80.0–100.0)
Platelets: 105 10*3/uL — ABNORMAL LOW (ref 150–400)
RBC: 2.98 MIL/uL — ABNORMAL LOW (ref 3.87–5.11)
RDW: 15.9 % — ABNORMAL HIGH (ref 11.5–15.5)
WBC: 4.3 10*3/uL (ref 4.0–10.5)
nRBC: 0 % (ref 0.0–0.2)

## 2018-03-14 LAB — BASIC METABOLIC PANEL
Anion gap: 6 (ref 5–15)
BUN: 17 mg/dL (ref 8–23)
CO2: 27 mmol/L (ref 22–32)
Calcium: 9.2 mg/dL (ref 8.9–10.3)
Chloride: 94 mmol/L — ABNORMAL LOW (ref 98–111)
Creatinine, Ser: 1.79 mg/dL — ABNORMAL HIGH (ref 0.44–1.00)
GFR calc Af Amer: 35 mL/min — ABNORMAL LOW (ref >60)
GFR calc non Af Amer: 30 mL/min — ABNORMAL LOW (ref >60)
Glucose, Bld: 96 mg/dL (ref 70–99)
Potassium: 4.8 mmol/L (ref 3.5–5.1)
Sodium: 127 mmol/L — ABNORMAL LOW (ref 135–145)

## 2018-03-14 LAB — ALBUMIN, PLEURAL OR PERITONEAL FLUID: Albumin, Fluid: 1.4 g/dL

## 2018-03-14 LAB — PROTIME-INR
INR: 1.33
Prothrombin Time: 16.3 seconds — ABNORMAL HIGH (ref 11.4–15.2)

## 2018-03-14 LAB — GLUCOSE, PLEURAL OR PERITONEAL FLUID: Glucose, Fluid: 90 mg/dL

## 2018-03-14 LAB — APTT: aPTT: 32 seconds (ref 24–36)

## 2018-03-14 LAB — BRAIN NATRIURETIC PEPTIDE: B Natriuretic Peptide: 51 pg/mL (ref 0.0–100.0)

## 2018-03-14 MED ORDER — ACETAMINOPHEN 650 MG RE SUPP: 650.0000 mg | Freq: Four times a day (QID) | RECTAL | Status: DC | PRN

## 2018-03-14 MED ORDER — HYDROCODONE-ACETAMINOPHEN 5-325 MG PO TABS
1.0000 | ORAL_TABLET | ORAL | Status: DC | PRN
  Administered 2018-03-18: 1 via ORAL
  Filled 2018-03-14: qty 1
  Filled 2018-03-14: qty 2

## 2018-03-14 MED ORDER — DOCUSATE SODIUM 100 MG PO CAPS
200.0000 mg | ORAL_CAPSULE | Freq: Two times a day (BID) | ORAL | Status: DC
  Administered 2018-03-14: 200 mg via ORAL
  Filled 2018-03-14: qty 2

## 2018-03-14 MED ORDER — ONDANSETRON HCL 4 MG/2ML IJ SOLN: 4.0000 mg | Freq: Four times a day (QID) | INTRAMUSCULAR | Status: DC | PRN

## 2018-03-14 MED ORDER — ACETAMINOPHEN 325 MG PO TABS: 650.0000 mg | ORAL_TABLET | Freq: Four times a day (QID) | ORAL | Status: DC | PRN

## 2018-03-14 MED ORDER — ONDANSETRON HCL 4 MG PO TABS
4.0000 mg | ORAL_TABLET | Freq: Four times a day (QID) | ORAL | Status: DC | PRN
  Administered 2018-03-16 – 2018-03-23 (×2): 4 mg via ORAL
  Filled 2018-03-14 (×2): qty 1

## 2018-03-14 MED ORDER — POLYETHYLENE GLYCOL 3350 17 G PO PACK: 17.0000 g | PACK | Freq: Every day | ORAL | Status: DC | PRN

## 2018-03-14 MED ORDER — DOCUSATE SODIUM 100 MG PO CAPS
100.0000 mg | ORAL_CAPSULE | Freq: Two times a day (BID) | ORAL | Status: DC
  Administered 2018-03-15 – 2018-03-18 (×5): 100 mg via ORAL
  Filled 2018-03-14 (×7): qty 1

## 2018-03-14 MED ORDER — ENOXAPARIN SODIUM 30 MG/0.3ML ~~LOC~~ SOLN
30.0000 mg | SUBCUTANEOUS | Status: DC
  Administered 2018-03-14: 30 mg via SUBCUTANEOUS
  Filled 2018-03-14 (×2): qty 0.3

## 2018-03-14 NOTE — ED Notes (Signed)
Pt transported for thoracentesis

## 2018-03-14 NOTE — Progress Notes (Signed)
Notify Dr. Jannifer Franklin about patient's complaints of constipation, order for colace given. RN will continue to monitor.

## 2018-03-14 NOTE — ED Notes (Signed)
ED TO INPATIENT HANDOFF REPORT  Name/Age/Gender Kelly Mclean 63 y.o. female  Code Status    Code Status Orders  (From admission, onward)         Start     Ordered   03/14/18 1333  Full code  Continuous     03/14/18 1336        Code Status History    This patient has a current code status but no historical code status.    Advance Directive Documentation     Most Recent Value  Type of Advance Directive  Living will  Pre-existing out of facility DNR order (yellow form or pink MOST form)  -  "MOST" Form in Place?  -      Home/SNF/Other Home  Chief Complaint sob ems  Level of Care/Admitting Diagnosis ED Disposition    ED Disposition Condition [place]: [place] [100120]  Level of Care: Med-Surg [16]  Diagnosis: Acute respiratory failure ([place]) [518.81.ICD-9-CM]  Admitting Physician: Bettey Costa [[id number]]  Attending Physician: MODY, Ulice Bold [[id number]]  Estimated length of stay: past midnight tomorrow  Certification:: I certify this patient will need inpatient services for at least 2 midnights  PT Class (Do Not Modify): Inpatient [101]  PT Acc Code (Do Not Modify): Private [1]       Medical History Past Medical History:  Diagnosis Date  . Anemia   . Asthma   . Cirrhosis ([place])   . Hypertension   . Pleural effusion     Allergies Allergies  Allergen Reactions  . Biaxin [Clarithromycin] Shortness Of Breath and Rash  . Penicillins Anaphylaxis and Other (See Comments)    Did it involve swelling of the face/tongue/throat, SOB, or low BP? Unknown Did it involve sudden or severe rash/hives, skin peeling, or any reaction on the inside of your mouth or nose? Unknown Did you need to seek medical attention at a hospital or doctor's office? Yes When did it last happen?childhood If all above answers are "NO", may proceed with cephalosporin use.    Marland Kitchen Zoloft [Sertraline Hcl] Other (See Comments)    Reaction: "made  crazy"  . Milk-Related Compounds Diarrhea    IV Location/Drains/Wounds Patient Lines/Drains/Airways Status   Active Line/Drains/Airways    Name:   Placement date:   Placement time:   Site:   Days:   Peripheral IV 03/14/18 Left Antecubital   03/14/18    1259    Antecubital   less than 1          Labs/Imaging Results for orders placed or performed during the hospital encounter of 03/14/18 (from the past 48 hour(s))  CBC     Status: Abnormal   Collection Time: 03/14/18  1:01 PM  Result Value Ref Range   WBC 4.3 4.0 - 10.5 K/uL   RBC 2.98 (L) 3.87 - 5.11 MIL/uL   Hemoglobin 10.1 (L) 12.0 - 15.0 g/dL   HCT 29.9 (L) 36.0 - 46.0 %   MCV 100.3 (H) 80.0 - 100.0 fL   MCH 33.9 26.0 - 34.0 pg   MCHC 33.8 30.0 - 36.0 g/dL   RDW 15.9 (H) 11.5 - 15.5 %   Platelets 105 (L) 150 - 400 K/uL    Comment: Immature Platelet Fraction may be clinically indicated, consider ordering this additional test [id number]    nRBC 0.0 0.0 - 0.2 %    Comment: Performed at [facility], [address], [place], [place] [address]  Basic metabolic panel  Status: Abnormal   Collection Time: 03/14/18  1:01 PM  Result Value Ref Range   Sodium 127 (L) 135 - 145 mmol/L   Potassium 4.8 3.5 - 5.1 mmol/L   Chloride 94 (L) 98 - 111 mmol/L   CO2 27 22 - 32 mmol/L   Glucose, Bld 96 70 - 99 mg/dL   BUN 17 8 - 23 mg/dL   Creatinine, Ser 1.79 (H) 0.44 - 1.00 mg/dL   Calcium 9.2 8.9 - 10.3 mg/dL   GFR calc non Af Amer 30 (L) >60 mL/min   GFR calc Af Amer 35 (L) >60 mL/min   Anion gap 6 5 - 15    Comment: Performed at [facility], [address] [place], [place] [address]  Protime-INR     Status: Abnormal   Collection Time: 03/14/18  1:01 PM  Result Value Ref Range   Prothrombin Time 16.3 (H) 11.4 - 15.2 seconds   INR 1.33     Comment: Performed at [facility], [address] [place], [place] [address]  APTT     Status: None   Collection Time: 03/14/18  1:01 PM  Result Value Ref  Range   aPTT 32 24 - 36 seconds    Comment: Performed at [facility], [address], [place], [place] [address]   Dg Chest 1 View  Result Date: 03/14/2018 CLINICAL DATA:  Status post thoracentesis EXAM: CHEST  1 VIEW COMPARISON:  March 14, 2018 12:56 p.m. FINDINGS: The heart size and mediastinal contours are within normal limits. There is small left pleural effusion significantly decreased compared prior exam. Consolidation left lung base is noted. There is no pneumothorax. The right lung is clear. The visualized skeletal structures are stable. IMPRESSION: There is small left pleural effusion, significantly decreased compared prior exam. Consolidation of left lung base is identified. There is no pneumothorax. Electronically Signed   By: Abelardo Diesel M.D.   On: 03/14/2018 16:19   Dg Chest Portable 1 View  Result Date: 03/14/2018 CLINICAL DATA:  Shortness of breath. EXAM: PORTABLE CHEST 1 VIEW COMPARISON:  Chest x-ray 03/06/2018. FINDINGS: Mediastinum and hilar structures normal. Large left pleural effusion, increased in size from prior exam. Underlying left lung atelectasis/infiltrate can not be excluded. No pneumothorax. Heart size most likely stable. IMPRESSION: Large left pleural effusion, increased from prior exam. Electronically Signed   By: Marcello Moores  Register   On: 03/14/2018 13:19   US Thoracentesis Asp Pleural Space W/img Guide  Result Date: 03/14/2018 CLINICAL DATA:  Recurrent left pleural effusion. EXAM: ULTRASOUND GUIDED LEFT THORACENTESIS COMPARISON:  None. PROCEDURE: An ultrasound guided thoracentesis was thoroughly discussed with the patient and questions answered. The benefits, risks, alternatives and complications were also discussed. The patient understands and wishes to proceed with the procedure. Written consent was obtained. Ultrasound was performed to localize and mark an adequate pocket of fluid in the left chest. The area was then prepped and draped in the normal  sterile fashion. 1% Lidocaine was used for local anesthesia. Under ultrasound guidance a 6 French Safe-T-Centesis catheter was introduced. Thoracentesis was performed. The catheter was removed and a dressing applied. COMPLICATIONS: None FINDINGS: A total of approximately 2.3 L of cloudy, yellowish-brown fluid was removed. A fluid sample was sent for laboratory analysis. IMPRESSION: Successful ultrasound guided left thoracentesis yielding 2.3 L of pleural fluid. Electronically Signed   By: Aletta Edouard M.D.   On: 03/14/2018 16:13    Pending Labs [employer] (From admission, onward)    Start  Ordered   03/21/18 0500  Creatinine, serum  (enoxaparin (LOVENOX)    CrCl >/= 30 ml/min)  Weekly,   STAT    Comments:  while on enoxaparin therapy    03/14/18 1336   03/15/18 5790  Basic metabolic panel  Tomorrow morning,   STAT     03/14/18 1336   03/15/18 0500  CBC  Tomorrow morning,   STAT     03/14/18 1336   03/14/18 1553  Body fluid culture  R     03/14/18 1553   03/14/18 1553  Albumin, pleural or peritoneal fluid  R     03/14/18 1553   03/14/18 1553  Protein, pleural or peritoneal fluid  R     03/14/18 1553   03/14/18 1553  Glucose, pleural or peritoneal fluid  R     03/14/18 1553   03/14/18 1335  Brain natriuretic peptide  Once,   STAT     03/14/18 1336   03/14/18 1333  HIV antibody (Routine Testing)  Once,   STAT     03/14/18 1336   03/14/18 1333  CBC  (enoxaparin (LOVENOX)    CrCl >/= 30 ml/min)  Once,   STAT    Comments:  Baseline for enoxaparin therapy IF NOT ALREADY DRAWN.  Notify MD if PLT < 100 K.    03/14/18 1336   03/14/18 1333  Creatinine, serum  (enoxaparin (LOVENOX)    CrCl >/= 30 ml/min)  Once,   STAT    Comments:  Baseline for enoxaparin therapy IF NOT ALREADY DRAWN.    03/14/18 1336          Vitals/Pain Today's Vitals   03/14/18 1428 03/14/18 1430 03/14/18 1500 03/14/18 1615  BP:  121/64 (!) 121/53   Pulse:  68  69  Resp:  18 20 15   Temp:      TempSrc:       SpO2:  99%  100%  Weight:      Height:      PainSc: 0-No pain       Isolation Precautions No active isolations  Medications Medications  enoxaparin (LOVENOX) injection 30 mg (has no administration in time range)  acetaminophen (TYLENOL) tablet 650 mg (has no administration in time range)    Or  acetaminophen (TYLENOL) suppository 650 mg (has no administration in time range)  HYDROcodone-acetaminophen (NORCO/VICODIN) 5-325 MG per tablet 1-2 tablet (has no administration in time range)  polyethylene glycol (MIRALAX / GLYCOLAX) packet 17 g (has no administration in time range)  ondansetron (ZOFRAN) tablet 4 mg (has no administration in time range)    Or  ondansetron (ZOFRAN) injection 4 mg (has no administration in time range)    Mobility walks

## 2018-03-14 NOTE — Progress Notes (Signed)
Called lab about patient's cytology order, patient's pleural fluid was collected at 1612 and can use that sample.

## 2018-03-14 NOTE — ED Notes (Signed)
Report given to Tasha RN.

## 2018-03-14 NOTE — ED Notes (Signed)
Pt transported to 242

## 2018-03-14 NOTE — Progress Notes (Signed)
   03/14/18 1500  Clinical Encounter Type  Visited With Patient not available;Health care provider  Visit Type Initial  Referral From Physician   OR received to update or complete an AD. Patient unavailable at the time of attempt;currently undergoing procedure.

## 2018-03-14 NOTE — ED Provider Notes (Signed)
[facility] Emergency Department Provider Note   ____________________________________________   First MD Initiated Contact with Patient 03/14/18 1250     (approximate)  I have reviewed the triage vital signs and the nursing notes.   HISTORY  Chief Complaint Shortness of Breath    HPI Kelly Mclean is a 63 y.o. female presents for evaluation of shortness of breath.  Patient reports that she started having shortness of breath yesterday [facility].  It worsened throughout the night and throughout today.  She called 911 because of increasing shortness of breath  No pain.  No fevers or chills.  No nausea or vomiting.  She is told it could be related to fluid buildup somehow from her liver but she is not really sure and [facility].  Reports presently she is moderately short of breath, it is helpful to be on oxygen  No nausea vomiting.  Does not take any blood thinners, reports her medication list is up-to-date here at [facility] but she does not remember all of her medications too well.  Past Medical History:  Diagnosis Date  . Anemia   . Asthma   . Cirrhosis ([place])   . Hypertension   . Pleural effusion     Patient Active Problem List   Diagnosis Date Noted  . Acute respiratory failure ([place]) 03/14/2018    Past Surgical History:  Procedure Laterality Date  . TUBAL LIGATION      Prior to Admission medications   Medication Sig Start Date End Date Taking? Authorizing Provider  albuterol (PROVENTIL HFA;VENTOLIN HFA) 108 (90 Base) MCG/ACT inhaler Inhale 2 puffs into the lungs every 6 (six) hours as needed for wheezing or shortness of breath.   Yes [provider]  atenolol (TENORMIN) 25 MG tablet Take 12.5 mg by mouth daily.   Yes [provider]  busPIRone (BUSPAR) 10 MG tablet Take 10 mg by mouth 3 (three) times daily.   Yes [provider]  calcium carbonate (TUMS - DOSED IN MG ELEMENTAL  CALCIUM) 500 MG chewable tablet Chew 2 tablets by mouth 2 (two) times daily.   Yes [provider]  citalopram (CELEXA) 20 MG tablet Take 20 mg by mouth daily.   Yes [provider]  docusate sodium (COLACE) 100 MG capsule Take 100 mg by mouth 2 (two) times daily.   Yes [provider]  famotidine (PEPCID) 40 MG tablet Take 40 mg by mouth at bedtime.   Yes [provider]  gabapentin (NEURONTIN) 100 MG capsule Take 200 mg by mouth at bedtime.   Yes [provider]  lisinopril (PRINIVIL,ZESTRIL) 2.5 MG tablet Take 2.5 mg by mouth at bedtime.   Yes [provider]  montelukast (SINGULAIR) 10 MG tablet Take 10 mg by mouth at bedtime.   Yes [provider]  primidone (MYSOLINE) 50 MG tablet Take 50 mg by mouth 2 (two) times daily.   Yes [provider]  lidocaine (LIDODERM) 5 % Place 1 patch onto the skin every 12 (twelve) hours. Remove & Discard patch within 12 hours or as directed by MD 02/20/18 02/20/19  Nena Polio, MD    Allergies Biaxin [clarithromycin]; Penicillins; Zoloft [sertraline hcl]; and Milk-related compounds  Family History  Problem Relation Age of Onset  . Breast cancer Maternal Aunt 84    Social History Social History   Tobacco Use  . Smoking status: Former Smoker    Last attempt to quit: 1990  Years since quitting: 30.1  . Smokeless tobacco: Never Used  Substance Use Topics  . Alcohol use: Not Currently  . Drug use: Not Currently    Review of Systems Constitutional: No fever/chills Eyes: No visual changes. ENT: No sore throat. Cardiovascular: Denies chest pain. Respiratory: Shortness of breath.  Worsened by walking and exertion gastrointestinal: No abdominal pain.   Genitourinary: Negative for dysuria. Musculoskeletal: Negative for back pain. Skin: Negative for rash. Neurological: Negative for headaches, areas of focal weakness or  numbness.    ____________________________________________   PHYSICAL EXAM:  VITAL SIGNS: ED Triage Vitals  Enc Vitals Group     BP 03/14/18 1253 (!) 126/58     Pulse Rate 03/14/18 1253 73     Resp 03/14/18 1253 20     Temp 03/14/18 1253 98 F (36.7 C)     Temp Source 03/14/18 1253 Oral     SpO2 03/14/18 1253 100 %     Weight 03/14/18 1255 160 lb (72.6 kg)     Height 03/14/18 1255 5' 3"  (1.6 m)     Head Circumference --      Peak Flow --      Pain Score 03/14/18 1255 0     Pain Loc --      Pain Edu? --      Excl. in [place]? --     Constitutional: Alert and oriented. Well appearing and in no acute distress. Eyes: Conjunctivae are normal. Head: Atraumatic. Nose: No congestion/rhinnorhea. Mouth/Throat: Mucous membranes are moist. Neck: No stridor.  Cardiovascular: Normal rate, regular rhythm. Grossly normal heart sounds.  Good peripheral circulation. Respiratory: There is mild tachypnea.  Oxygen saturation about 86 to 87% while speaking to me in short phrases, improves on nasal cannula and her oxygen saturation mid 90s when not speaking but does appear to get winded easily.  Mild accessory muscle use.  Almost no lung sounds are heard over the left side.  Clear lungs on the right. Gastrointestinal: Soft and nontender. No distention. Musculoskeletal: No lower extremity tenderness nor edema. Neurologic:  Normal speech and language. No gross focal neurologic deficits are appreciated.  Skin:  Skin is warm, dry and intact. No rash noted. Psychiatric: Mood and affect are normal. Speech and behavior are normal.  ____________________________________________   LABS (all labs ordered are listed, but only abnormal results are displayed)  Labs Reviewed  CBC - Abnormal; Notable for the following components:      Result Value   RBC 2.98 (*)    Hemoglobin 10.1 (*)    HCT 29.9 (*)    MCV 100.3 (*)    RDW 15.9 (*)    Platelets 105 (*)    All other components within normal limits   BASIC METABOLIC PANEL - Abnormal; Notable for the following components:   Sodium 127 (*)    Chloride 94 (*)    Creatinine, Ser 1.79 (*)    GFR calc non Af Amer 30 (*)    GFR calc Af Amer 35 (*)    All other components within normal limits  PROTIME-INR - Abnormal; Notable for the following components:   Prothrombin Time 16.3 (*)    All other components within normal limits  APTT  HIV ANTIBODY (ROUTINE TESTING W REFLEX)  CBC  CREATININE, SERUM  BRAIN NATRIURETIC PEPTIDE   ____________________________________________  EKG  Reviewed and interpreted by me at 1330 Heart rate 75 PR 150 QRS 80 QTc somewhat hard to appreciate, perhaps slightly prolonged Normal sinus rhythm, somewhat low voltages,  no evidence of acute ischemia. ____________________________________________  RADIOLOGY  Dg Chest Portable 1 View  Result Date: 03/14/2018 CLINICAL DATA:  Shortness of breath. EXAM: PORTABLE CHEST 1 VIEW COMPARISON:  Chest x-ray 03/06/2018. FINDINGS: Mediastinum and hilar structures normal. Large left pleural effusion, increased in size from prior exam. Underlying left lung atelectasis/infiltrate can not be excluded. No pneumothorax. Heart size most likely stable. IMPRESSION: Large left pleural effusion, increased from prior exam. Electronically Signed   By: [place]   On: 03/14/2018 13:19    X-ray results reviewed by me.  After reviewing and setting of the patient's symptoms Dr. Kathlene Cote from interventional radiology has been consulted and ordered for thoracentesis has been placed. ____________________________________________   PROCEDURES  Procedure(s) performed: None  Procedures  Critical Care performed: No  ____________________________________________   INITIAL IMPRESSION / ASSESSMENT AND PLAN / ED COURSE  Pertinent labs & imaging results that were available during my care of the patient were reviewed by me and considered in my medical decision making (see chart for  details).   Patient presents for dyspnea.  No associated pain or fevers or other symptoms.  Decreased left-sided lung sounds with apparent recurrent left-sided pleural effusion.  She is obviously symptomatic, and no extremitas but does have increased work of breathing and mild O2 requirement with especially when speaking.  Have ordered thoracentesis at this time.    ----------------------------------------- 2:35 PM on 03/14/2018 -----------------------------------------  Patient agreeable understanding of plan for admission.  She is also understanding of need for recurrent thoracentesis.  Patient admitting to the hospitalist service.  Also hyponatremia, etiology somewhat unclear at this time.  Further work-up and care in the hospital services      ____________________________________________   FINAL CLINICAL IMPRESSION(S) / ED DIAGNOSES  Final diagnoses:  Dyspnea  Pleural effusion  Hypoxia        Note:  This document was prepared using Dragon voice recognition software and may include unintentional dictation errors       Delman Kitten, MD 03/14/18 1436

## 2018-03-14 NOTE — Procedures (Signed)
Interventional Radiology Procedure Note  Procedure: US guided left thoracentesis  Complications: None  Estimated Blood Loss: None  Findings: 2.3 L of cloudy, yellow fluid removed from left pleural space. Post CXR pending.  Venetia Night. Kathlene Cote, M.D Pager:  [phone]

## 2018-03-14 NOTE — ED Triage Notes (Signed)
Pt arrived via ems for report of shortness of breath with exertion and nausea off and on x2-3 months - she reports that she has fluid that collects in her lungs r/t liver cirrhosis - she had fluid drawn off 1 week ago

## 2018-03-14 NOTE — Progress Notes (Signed)
Family Meeting Note  Advance Directive:no  Today a meeting took place with the Patient.  The following clinical team members were present during this meeting:MD  The following were discussed:Patient's diagnosis: Recurrent pleural effusion with liver cirrhosis, Patient's progosis: > 12 months and Goals for treatment: Full Code  Additional follow-up to be provided: chaplain to create AD  Time spent during discussion:16 minutes  Kelly Hase, MD

## 2018-03-14 NOTE — H&P (Signed)
[facility] NAME: Kelly Mclean    MR#:  [id number]  DATE OF BIRTH:  18-May-1955  DATE OF ADMISSION:  03/14/2018  PRIMARY CARE PHYSICIAN: Marnee Guarneri, MD   REQUESTING/REFERRING PHYSICIAN: dr Jacqualine Code  CHIEF COMPLAINT:   SOB HISTORY OF PRESENT ILLNESS:  Kelly Mclean  is a 63 y.o. female with a known history of recurrent pleural effusion, essential hypertension and liver cirrhosis who presents emergency room due to shortness of breath.  Patient has had thoracentesis twice in the past.  Recent thoracentesis was 3 weeks ago.  Cytology has been negative.  She was told that this pleural effusion is due to liver cirrhosis.  She used to drink EtOH but has not drank for several years.  She does not smoke.  She presents today due to 24 hours of increasing shortness of breath and lethargy.  She is found to have a large left pleural effusion.  ER physician has contacted interventional radiologist who will perform thoracentesis today.  As mentioned cytology was negative for malignancy.  Looking at her labs from last thoracentesis it appears to be transudate of in nature.  PAST MEDICAL HISTORY:   Past Medical History:  Diagnosis Date  . Anemia   . Asthma   . Cirrhosis ([place])   . Hypertension   . Pleural effusion     PAST SURGICAL HISTORY:   Past Surgical History:  Procedure Laterality Date  . TUBAL LIGATION      SOCIAL HISTORY:   Social History   Tobacco Use  . Smoking status: Former Smoker    Last attempt to quit: 1990    Years since quitting: 30.1  . Smokeless tobacco: Never Used  Substance Use Topics  . Alcohol use: Not Currently    FAMILY HISTORY:   Family History  Problem Relation Age of Onset  . Breast cancer Maternal Aunt 84    DRUG ALLERGIES:   Allergies  Allergen Reactions  . Biaxin [Clarithromycin] Shortness Of Breath and Rash  . Penicillins Anaphylaxis and Other (See Comments)    Did it involve swelling of  the face/tongue/throat, SOB, or low BP? Unknown Did it involve sudden or severe rash/hives, skin peeling, or any reaction on the inside of your mouth or nose? Unknown Did you need to seek medical attention at a hospital or doctor's office? Yes When did it last happen?childhood If all above answers are "NO", may proceed with cephalosporin use.    Marland Kitchen Zoloft [Sertraline Hcl] Other (See Comments)    Reaction: "made crazy"  . Milk-Related Compounds Diarrhea    REVIEW OF SYSTEMS:   Review of Systems  Constitutional: Positive for malaise/fatigue. Negative for chills and fever.  HENT: Negative.  Negative for ear discharge, ear pain, hearing loss, nosebleeds and sore throat.   Eyes: Negative.  Negative for blurred vision and pain.  Respiratory: Positive for shortness of breath. Negative for cough, hemoptysis and wheezing.   Cardiovascular: Negative.  Negative for chest pain, palpitations and leg swelling.  Gastrointestinal: Negative.  Negative for abdominal pain, blood in stool, diarrhea, nausea and vomiting.  Genitourinary: Negative.  Negative for dysuria.  Musculoskeletal: Negative.  Negative for back pain.  Skin: Negative.   Neurological: Negative for dizziness, tremors, speech change, focal weakness, seizures and headaches.  Endo/Heme/Allergies: Negative.  Does not bruise/bleed easily.  Psychiatric/Behavioral: Negative.  Negative for depression, hallucinations and suicidal ideas.    MEDICATIONS AT HOME:   Prior to Admission medications   Medication Sig  Start Date End Date Taking? Authorizing Provider  atenolol (TENORMIN) 25 MG tablet Take 12.5 mg by mouth daily.   Yes [provider]  busPIRone (BUSPAR) 10 MG tablet Take 10 mg by mouth 3 (three) times daily.    [provider]  calcium carbonate (TUMS - DOSED IN MG ELEMENTAL CALCIUM) 500 MG chewable tablet Chew 2 tablets by mouth 2 (two) times daily.    [provider]  cholecalciferol (VITAMIN D) 25 MCG  (1000 UT) tablet Take 1,000 Units by mouth daily.    [provider]  citalopram (CELEXA) 20 MG tablet Take 20 mg by mouth daily.    [provider]  famotidine (PEPCID) 40 MG tablet Take 40 mg by mouth at bedtime.    [provider]  ferrous sulfate 325 (65 FE) MG tablet Take 325 mg by mouth 2 (two) times daily.    [provider]  gabapentin (NEURONTIN) 100 MG capsule Take 200 mg by mouth at bedtime.    [provider]  lidocaine (LIDODERM) 5 % Place 1 patch onto the skin every 12 (twelve) hours. Remove & Discard patch within 12 hours or as directed by MD 02/20/18 02/20/19  Nena Polio, MD  lisinopril (PRINIVIL,ZESTRIL) 2.5 MG tablet Take 2.5 mg by mouth at bedtime.    [provider]  montelukast (SINGULAIR) 10 MG tablet Take 10 mg by mouth at bedtime.    [provider]  primidone (MYSOLINE) 50 MG tablet Take 50 mg by mouth 2 (two) times daily.    [provider]  vitamin B-12 (CYANOCOBALAMIN) 1000 MCG tablet Take 1,000 mcg by mouth daily.    [provider]      VITAL SIGNS:  Blood pressure 130/62, pulse 72, temperature 98 F (36.7 C), temperature source Oral, resp. rate 18, height 5' 3"  (1.6 m), weight 72.6 kg, SpO2 100 %.  PHYSICAL EXAMINATION:   Physical Exam Constitutional:      General: She is not in acute distress. HENT:     Head: Normocephalic.  Eyes:     General: No scleral icterus. Neck:     Musculoskeletal: Normal range of motion and neck supple.     Vascular: No JVD.     Trachea: No tracheal deviation.  Cardiovascular:     Rate and Rhythm: Normal rate and regular rhythm.     Heart sounds: Normal heart sounds. No murmur. No friction rub. No gallop.   Pulmonary:     Effort: Pulmonary effort is normal. No respiratory distress.     Breath sounds: Examination of the left-upper field reveals decreased breath sounds. Examination of the left-middle field reveals decreased breath sounds.  Examination of the left-lower field reveals decreased breath sounds. Decreased breath sounds present. No wheezing or rales.  Chest:     Chest wall: No tenderness.  Abdominal:     General: Bowel sounds are normal. There is no distension.     Palpations: Abdomen is soft. There is no mass.     Tenderness: There is no abdominal tenderness. There is no guarding or rebound.  Musculoskeletal: Normal range of motion.  Skin:    General: Skin is warm.     Findings: No erythema or rash.  Neurological:     Mental Status: She is alert and oriented to person, place, and time.  Psychiatric:        Judgment: Judgment normal.       LABORATORY PANEL:   CBC Recent Labs  Lab 03/14/18 1301  WBC 4.3  HGB 10.1*  HCT 29.9*  PLT 105*   ------------------------------------------------------------------------------------------------------------------  Chemistries  Recent Labs  Lab 03/14/18 1301  NA 127*  K 4.8  CL 94*  CO2 27  GLUCOSE 96  BUN 17  CREATININE 1.79*  CALCIUM 9.2   ------------------------------------------------------------------------------------------------------------------  Cardiac Enzymes No results for input(s): TROPONINI in the last 168 hours. ------------------------------------------------------------------------------------------------------------------  RADIOLOGY:  Dg Chest Portable 1 View  Result Date: 03/14/2018 CLINICAL DATA:  Shortness of breath. EXAM: PORTABLE CHEST 1 VIEW COMPARISON:  Chest x-ray 03/06/2018. FINDINGS: Mediastinum and hilar structures normal. Large left pleural effusion, increased in size from prior exam. Underlying left lung atelectasis/infiltrate can not be excluded. No pneumothorax. Heart size most likely stable. IMPRESSION: Large left pleural effusion, increased from prior exam. Electronically Signed   By: Marcello Moores  Register   On: 03/14/2018 13:19    EKG:   Orders placed or performed during the hospital encounter of 03/14/18  . ED EKG   . ED EKG  . EKG 12-Lead  . EKG 12-Lead    IMPRESSION AND PLAN:   63 year old female with history of recurrent pleural effusion possibly due to liver cirrhosis who presents today due to shortness of breath.  1.  Acute hypoxic respiratory failure in the setting of large left pleural effusion Wean oxygen as tolerated  2.  Large left pleural effusion with negative cytology in the past and appears to have been transudative in nature supposedly from liver cirrhosis: Thoracentesis ordered by ER physician to be performed today Echocardiogram Consultation with pulmonary and consult placed via epic and spoke with Dr. Jamal Collin  3.  Essential hypertension: Continue atenolol and lisinopril  4.  Depression: Continue BuSpar and citalopram 5  Liver cirrhosis: Patient had ultrasound of the abdomen in February 11 which shows portal hypertension and liver cirrhosis. Follow-up CT scan of the abdomen did not show pancreatic mass She will need outpatient follow-up with GI. Consider adding Lasix and Aldactone prior to discharge.   All the records are reviewed and case discussed with ED provider. Management plans discussed with the patient and she is in agreement  CODE STATUS: full  TOTAL TIME TAKING CARE OF THIS PATIENT: 45 minutes.    Constanza Mincy M.D on 03/14/2018 at 2:02 PM  Between 7am to 6pm - Pager - [phone]  After 6pm go to [email or web address] - password [email or web address]  Office  [phone]  CC: Primary care physician; Marnee Guarneri, MD

## 2018-03-14 NOTE — Consult Note (Signed)
Pt will be seen in consultation 2/22. I have reviewed records and films. I have added cell count with diff and cytology to pleural fluid that has already been obtained. I would also like to get a CT chest now that her pleural space has been evacuated.   Merton Border, MD PCCM service Mobile [phone] Pager [phone] 03/14/2018 8:16 PM

## 2018-03-15 ENCOUNTER — Inpatient Hospital Stay
Admit: 2018-03-15 | Discharge: 2018-03-15 | Disposition: A | Discharge status: Home / Self Care | Payer: Medicare (Managed Care) | Attending: Internal Medicine | Admitting: Internal Medicine

## 2018-03-15 DIAGNOSIS — K7031 Alcoholic cirrhosis of liver with ascites: Secondary | ICD-10-CM

## 2018-03-15 DIAGNOSIS — J9600 Acute respiratory failure, unspecified whether with hypoxia or hypercapnia: Secondary | ICD-10-CM

## 2018-03-15 DIAGNOSIS — J90 Pleural effusion, not elsewhere classified: Secondary | ICD-10-CM

## 2018-03-15 LAB — BODY FLUID CELL COUNT WITH DIFFERENTIAL
EOS FL: 0 %
Lymphs, Fluid: 55 %
Monocyte-Macrophage-Serous Fluid: 41 %
Neutrophil Count, Fluid: 4 %
Other Cells, Fluid: 0 %
Total Nucleated Cell Count, Fluid: 110 cu mm

## 2018-03-15 LAB — HEPATIC FUNCTION PANEL
ALT: 20 U/L (ref 0–44)
AST: 29 U/L (ref 15–41)
Albumin: 2.4 g/dL — ABNORMAL LOW (ref 3.5–5.0)
Alkaline Phosphatase: 51 U/L (ref 38–126)
BILIRUBIN DIRECT: 0.4 mg/dL — AB (ref 0.0–0.2)
Indirect Bilirubin: 1.4 mg/dL — ABNORMAL HIGH (ref 0.3–0.9)
Total Bilirubin: 1.8 mg/dL — ABNORMAL HIGH (ref 0.3–1.2)
Total Protein: 5.5 g/dL — ABNORMAL LOW (ref 6.5–8.1)

## 2018-03-15 LAB — BASIC METABOLIC PANEL
Anion gap: 6 (ref 5–15)
BUN: 19 mg/dL (ref 8–23)
CO2: 26 mmol/L (ref 22–32)
Calcium: 8.7 mg/dL — ABNORMAL LOW (ref 8.9–10.3)
Chloride: 97 mmol/L — ABNORMAL LOW (ref 98–111)
Creatinine, Ser: 1.65 mg/dL — ABNORMAL HIGH (ref 0.44–1.00)
GFR calc Af Amer: 38 mL/min — ABNORMAL LOW (ref >60)
GFR calc non Af Amer: 33 mL/min — ABNORMAL LOW (ref >60)
Glucose, Bld: 72 mg/dL (ref 70–99)
Potassium: 4.6 mmol/L (ref 3.5–5.1)
Sodium: 129 mmol/L — ABNORMAL LOW (ref 135–145)

## 2018-03-15 LAB — PROTEIN, BODY FLUID (OTHER): Total Protein, Body Fluid Other: 2.2 g/dL

## 2018-03-15 LAB — CBC
HCT: 25.9 % — ABNORMAL LOW (ref 36.0–46.0)
Hemoglobin: 8.7 g/dL — ABNORMAL LOW (ref 12.0–15.0)
MCH: 33.6 pg (ref 26.0–34.0)
MCHC: 33.6 g/dL (ref 30.0–36.0)
MCV: 100 fL (ref 80.0–100.0)
Platelets: 78 10*3/uL — ABNORMAL LOW (ref 150–400)
RBC: 2.59 MIL/uL — ABNORMAL LOW (ref 3.87–5.11)
RDW: 15.5 % (ref 11.5–15.5)
WBC: 3.6 10*3/uL — AB (ref 4.0–10.5)
nRBC: 0 % (ref 0.0–0.2)

## 2018-03-15 LAB — LACTATE DEHYDROGENASE, PLEURAL OR PERITONEAL FLUID: LD, Fluid: 60 U/L — ABNORMAL HIGH (ref 3–23)

## 2018-03-15 LAB — ECHOCARDIOGRAM COMPLETE
Height: 62 in
Weight: 2516.8 oz

## 2018-03-15 MED ORDER — FUROSEMIDE 20 MG PO TABS
20.0000 mg | ORAL_TABLET | Freq: Two times a day (BID) | ORAL | Status: DC
  Administered 2018-03-15 – 2018-03-18 (×5): 20 mg via ORAL
  Filled 2018-03-15 (×6): qty 1

## 2018-03-15 MED ORDER — MUSCLE RUB 10-15 % EX CREA: TOPICAL_CREAM | CUTANEOUS | Status: DC | PRN

## 2018-03-15 MED ORDER — SPIRONOLACTONE 25 MG PO TABS
25.0000 mg | ORAL_TABLET | Freq: Every day | ORAL | Status: DC
  Administered 2018-03-15: 25 mg via ORAL
  Filled 2018-03-15: qty 1

## 2018-03-15 MED ORDER — POTASSIUM CHLORIDE CRYS ER 20 MEQ PO TBCR: 20.0000 meq | EXTENDED_RELEASE_TABLET | Freq: Every day | ORAL | Status: DC

## 2018-03-15 MED ORDER — TROLAMINE SALICYLATE 10 % EX CREA
TOPICAL_CREAM | CUTANEOUS | Status: DC | PRN
  Administered 2018-03-18 – 2018-03-25 (×2): via TOPICAL
  Filled 2018-03-15: qty 85

## 2018-03-15 MED ORDER — SALINE SPRAY 0.65 % NA SOLN
1.0000 | NASAL | Status: DC | PRN
  Administered 2018-03-16: 1 via NASAL
  Filled 2018-03-15: qty 44

## 2018-03-15 NOTE — Consult Note (Signed)
PULMONARY CONSULT NOTE  Requesting MD/Service: [facility] hospitalists Date of initial consultation: 03/15/18 Reason for consultation:   PT PROFILE: 63 y.o. 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 Extensive lingular and left lower lobe atelectasis. Nonspecific subcarinal adenopathy. Cirrhosis with upper abdominal ascites and esophageal varices 02/19/18 thoracentesis: LDH 95, protein 3.3. wbc 301 (79% lymphs), cytology negative for malignancy 03/06/18 CTAP: No pancreatic lesion identified. Peripancreatic lymph node is mildly enlarged. Small periampullary duodenum diverticulum present. Morphologic changes in liver consistent with cirrhosis. No billiary duct dilatation of the intrahepatic or extrahepatic bile ducts. Moderate volume of intraperitoneal free fluid. Large LEFT pleural effusion 03/14/18 thoracentesis: LDH 60, protein <3.0, WBC 110 (55% lymphs, 41% monos) 03/14/18 CT chest: Moderate to large L pleural effusion with compressive atelectasis.  Liver cirrhosis with moderate to large volume ascites   INTERVAL:  HPI:  She is a somewhat difficult historian.  She tries to be cooperative but perseverates over simple answers.  To the best that I can ascertain, she has had increasing dyspnea for several weeks.  She describes her dyspnea as difficulty obtaining a deep breath.  She denies fever, pleuritic chest pain, purulent sputum, hemoptysis.  She does have intermittent lower extremity edema.  She was seen in the emergency department on 02/19/2018 with CT and chest x-ray revealing large L pleural effusion.  As far as I can tell, she underwent thoracentesis with 2.2 L of pleural fluid removed.  After that, her dyspnea improved significantly.  Since that time, she has undergone thoracentesis again on 2/13 and on 2/21.  After each of these procedures, pleural  effusion and exertional dyspnea recur rapidly.  She reports approximately 15 pound weight loss in the last several weeks.  In evaluation of the pleural effusions, she has undergone CT of the chest as documented above.  CT which was performed 2/21 reveals only fluid in the left pleural space.  There does not appear to be pleural disease or any pulmonary or mediastinal disease.  She does have a history of alcohol abuse in the past.  I did not ask her about this but it is recorded in her records.  She has no prior history of pulmonary disease and no significant occupational or environmental exposures.  She has no history of tuberculosis exposure and per her report had a negative PPD approximately 1 year ago.  Past Medical History:  Diagnosis Date  . Anemia   . Asthma   . Cirrhosis ([place])   . Hypertension   . Pleural effusion     Past Surgical History:  Procedure Laterality Date  . TUBAL LIGATION      MEDICATIONS: I have reviewed all medications and confirmed regimen as documented  Social History   Socioeconomic History  . Marital status: Married    Spouse name: Not on file  . Number of children: Not on file  . Years of education: Not on file  . Highest education level: Not on file  Occupational History  . Not on file  Social Needs  . Financial resource strain: Not on file  . Food insecurity:    Worry: Not on file    Inability: Not on file  . Transportation needs:    Medical: Not on file    Non-medical: Not on file  Tobacco Use  . Smoking status: Former Smoker    Last attempt to quit: 1990    Years  since quitting: 30.1  . Smokeless tobacco: Never Used  Substance and Sexual Activity  . Alcohol use: Not Currently  . Drug use: Not Currently  . Sexual activity: Not Currently  Lifestyle  . Physical activity:    Days per week: Not on file    Minutes per session: Not on file  . Stress: Not on file  Relationships  . Social connections:    Talks on phone: Not on file     Gets together: Not on file    Attends religious service: Not on file    Active member of club or organization: Not on file    Attends meetings of clubs or organizations: Not on file    Relationship status: Not on file  . Intimate partner violence:    Fear of current or ex partner: Not on file    Emotionally abused: Not on file    Physically abused: Not on file    Forced sexual activity: Not on file  Other Topics Concern  . Not on file  Social History Narrative  . Not on file   Prior history of drug and alcohol abuse.  Family History  Problem Relation Age of Onset  . Breast cancer Maternal Aunt 84    ROS: No fever, myalgias/arthralgias No new focal weakness or sensory deficits No otalgia, hearing loss, visual changes, nasal and sinus symptoms, mouth and throat problems No neck pain or adenopathy No abdominal pain, N/V/D, diarrhea, change in bowel pattern No dysuria, change in urinary pattern   Vitals:   03/14/18 2000 03/14/18 2225 03/15/18 0452 03/15/18 0803  BP: (!) 117/46  119/64 (!) 128/56  Pulse: 68  72 69  Resp: 17  18 (!) 8  Temp: 99 F (37.2 C)  98.4 F (36.9 C) 98.6 F (37 C)  TempSrc: Oral  Oral Oral  SpO2: 100% 95% 94% 96%  Weight:      Height:         EXAM:  Gen: WDWN, No overt respiratory distress HEENT: NCAT, sclera white, oropharynx normal Neck: Supple without LAN, thyromegaly, JVD Lungs: Dullness to percussion with diminished breath sounds approximately one half up on the left.  Otherwise breath sounds are normal. Cardiovascular: RRR, no murmurs noted Abdomen: Soft, nontender, normal BS Ext: without clubbing, cyanosis, edema Neuro: CNs grossly intact, motor and sensory intact Skin: Limited exam, no lesions noted  DATA:   BMP Latest Ref Rng & Units 03/15/2018 03/14/2018 02/19/2018  Glucose 70 - 99 mg/dL 72 96 90  BUN 8 - 23 mg/dL 19 17 19   Creatinine 0.44 - 1.00 mg/dL 1.65(H) 1.79(H) 1.60(H)  Sodium 135 - 145 mmol/L 129(L) 127(L) 134(L)   Potassium 3.5 - 5.1 mmol/L 4.6 4.8 4.1  Chloride 98 - 111 mmol/L 97(L) 94(L) 103  CO2 22 - 32 mmol/L 26 27 26   Calcium 8.9 - 10.3 mg/dL 8.7(L) 9.2 9.4    CBC Latest Ref Rng & Units 03/15/2018 03/14/2018 02/19/2018  WBC 4.0 - 10.5 K/uL 3.6(L) 4.3 7.5  Hemoglobin 12.0 - 15.0 g/dL 8.7(L) 10.1(L) 10.0(L)  Hematocrit 36.0 - 46.0 % 25.9(L) 29.9(L) 29.2(L)  Platelets 150 - 400 K/uL 78(L) 105(L) 112(L)    CXR 02/21: Large left pleural effusion  I have personally reviewed all chest radiographs reported above including CXRs and CT chest unless otherwise indicated  IMPRESSION:   Rapidly recurrent transudate of lymphocyte predominant L pleural effusion.    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  These are far more common on the R side than on the L side but can occur on the left up to 15% of the time.   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 4) QuantiFERON gold has been ordered but it seems highly unlikely that this is tuberculosis pleuritis given the transudate of chemistries  She will be seen again by pulmonary medicine on Monday 2/24.  Please call sooner if needed.   Merton Border, MD PCCM service Mobile [phone] Pager [phone] 03/15/2018 1:04 PM

## 2018-03-15 NOTE — Progress Notes (Signed)
[facility] NAME: Thalya Fouche    MR#:  [id number]  DATE OF BIRTH:  1955/02/24  SUBJECTIVE:  CHIEF COMPLAINT:   Chief Complaint  Patient presents with  . Shortness of Breath  Patient seen and evaluated today Has decreased shortness of breath Successfully underwent thoracentesis yesterday No complaints of any chest pain   REVIEW OF SYSTEMS:    ROS  CONSTITUTIONAL: No documented fever. No fatigue, weakness. No weight gain, no weight loss.  EYES: No blurry or double vision.  ENT: No tinnitus. No postnasal drip. No redness of the oropharynx.  RESPIRATORY: No cough, no wheeze, no hemoptysis.  Decreased dyspnea.  CARDIOVASCULAR: No chest pain. No orthopnea. No palpitations. No syncope.  GASTROINTESTINAL: No nausea, no vomiting or diarrhea. No abdominal pain. No melena or hematochezia.  GENITOURINARY: No dysuria or hematuria.  ENDOCRINE: No polyuria or nocturia. No heat or cold intolerance.  HEMATOLOGY: No anemia. No bruising. No bleeding.  INTEGUMENTARY: No rashes. No lesions.  MUSCULOSKELETAL: No arthritis. No swelling. No gout.  NEUROLOGIC: No numbness, tingling, or ataxia. No seizure-type activity.  PSYCHIATRIC: No anxiety. No insomnia. No ADD.   DRUG ALLERGIES:   Allergies  Allergen Reactions  . Biaxin [Clarithromycin] Shortness Of Breath and Rash  . Penicillins Anaphylaxis and Other (See Comments)    Did it involve swelling of the face/tongue/throat, SOB, or low BP? Unknown Did it involve sudden or severe rash/hives, skin peeling, or any reaction on the inside of your mouth or nose? Unknown Did you need to seek medical attention at a hospital or doctor's office? Yes When did it last happen?childhood If all above answers are "NO", may proceed with cephalosporin use.    Marland Kitchen Zoloft [Sertraline Hcl] Other (See Comments)    Reaction: "made crazy"  . Milk-Related Compounds Diarrhea    VITALS:  Blood pressure (!)  128/56, pulse 69, temperature 98.6 F (37 C), temperature source Oral, resp. rate (!) 8, height 5' 2"  (1.575 m), weight 71.4 kg, SpO2 96 %.  PHYSICAL EXAMINATION:   Physical Exam  GENERAL:  63 y.o.-year-old patient lying in the bed with no acute distress.  EYES: Pupils equal, round, reactive to light and accommodation. No scleral icterus. Extraocular muscles intact.  HEENT: Head atraumatic, normocephalic. Oropharynx and nasopharynx clear.  NECK:  Supple, no jugular venous distention. No thyroid enlargement, no tenderness.  LUNGS: Improved breath sounds bilaterally, decreased bibasilar crepitations. No use of accessory muscles of respiration.  CARDIOVASCULAR: S1, S2 normal. No murmurs, rubs, or gallops.  ABDOMEN: Soft, nontender, nondistended. Bowel sounds present. No organomegaly or mass.  EXTREMITIES: No cyanosis, clubbing or edema b/l.    NEUROLOGIC: Cranial nerves II through XII are intact. No focal Motor or sensory deficits b/l.   PSYCHIATRIC: The patient is alert and oriented x 3.  SKIN: No obvious rash, lesion, or ulcer.   LABORATORY PANEL:   CBC Recent Labs  Lab 03/15/18 0451  WBC 3.6*  HGB 8.7*  HCT 25.9*  PLT 78*   ------------------------------------------------------------------------------------------------------------------ Chemistries  Recent Labs  Lab 03/15/18 0451  NA 129*  K 4.6  CL 97*  CO2 26  GLUCOSE 72  BUN 19  CREATININE 1.65*  CALCIUM 8.7*   ------------------------------------------------------------------------------------------------------------------  Cardiac Enzymes No results for input(s): TROPONINI in the last 168 hours. ------------------------------------------------------------------------------------------------------------------  RADIOLOGY:  Dg Chest 1 View  Result Date: 03/14/2018 CLINICAL DATA:  Status post thoracentesis EXAM: CHEST  1 VIEW COMPARISON:  March 14, 2018 12:56 p.m. FINDINGS: The  heart size and mediastinal  contours are within normal limits. There is small left pleural effusion significantly decreased compared prior exam. Consolidation left lung base is noted. There is no pneumothorax. The right lung is clear. The visualized skeletal structures are stable. IMPRESSION: There is small left pleural effusion, significantly decreased compared prior exam. Consolidation of left lung base is identified. There is no pneumothorax. Electronically Signed   By: Abelardo Diesel M.D.   On: 03/14/2018 16:19   Ct Chest Wo Contrast  Result Date: 03/14/2018 CLINICAL DATA:  Pleural effusion and dyspnea since yesterday. EXAM: CT CHEST WITHOUT CONTRAST TECHNIQUE: Multidetector CT imaging of the chest was performed following the standard protocol without IV contrast. COMPARISON:  03/14/2018 CXR FINDINGS: Cardiovascular: Common arterial branch of the right brachiocephalic left common carotid arteries. Atherosclerosis of left subclavian artery origin. Nonaneurysmal atherosclerotic aorta. The unenhanced pulmonary vessels are unremarkable. The heart size is normal with small anterior pericardial effusion without thickening noted. Mediastinum/Nodes: Small subcentimeter prevascular and paratracheal lymph nodes. 1 cm short axis subcarinal lymph node. Patent trachea and mainstem bronchi. Unremarkable CT appearance of the esophagus. The thyroid gland is unremarkable without dominant mass. Lungs/Pleura: Moderate to large layering left effusion with adjacent atelectasis. The right lung is clear. No pneumothorax or dominant mass. Upper Abdomen: Cirrhotic appearance of the liver with moderate to large volume of ascites. Musculoskeletal: No chest wall mass or suspicious bone lesions identified. Mild thoracic spondylosis. IMPRESSION: 1. Moderate to large layering left pleural effusion with adjacent atelectasis. 2. Cirrhotic liver with moderate to large volume of ascites. Aortic Atherosclerosis (ICD10-I70.0). Electronically Signed   By: Ashley Royalty M.D.    On: 03/14/2018 22:17   Dg Chest Portable 1 View  Result Date: 03/14/2018 CLINICAL DATA:  Shortness of breath. EXAM: PORTABLE CHEST 1 VIEW COMPARISON:  Chest x-ray 03/06/2018. FINDINGS: Mediastinum and hilar structures normal. Large left pleural effusion, increased in size from prior exam. Underlying left lung atelectasis/infiltrate can not be excluded. No pneumothorax. Heart size most likely stable. IMPRESSION: Large left pleural effusion, increased from prior exam. Electronically Signed   By: Marcello Moores  Register   On: 03/14/2018 13:19   US Thoracentesis Asp Pleural Space W/img Guide  Result Date: 03/14/2018 CLINICAL DATA:  Recurrent left pleural effusion. EXAM: ULTRASOUND GUIDED LEFT THORACENTESIS COMPARISON:  None. PROCEDURE: An ultrasound guided thoracentesis was thoroughly discussed with the patient and questions answered. The benefits, risks, alternatives and complications were also discussed. The patient understands and wishes to proceed with the procedure. Written consent was obtained. Ultrasound was performed to localize and mark an adequate pocket of fluid in the left chest. The area was then prepped and draped in the normal sterile fashion. 1% Lidocaine was used for local anesthesia. Under ultrasound guidance a 6 French Safe-T-Centesis catheter was introduced. Thoracentesis was performed. The catheter was removed and a dressing applied. COMPLICATIONS: None FINDINGS: A total of approximately 2.3 L of cloudy, yellowish-brown fluid was removed. A fluid sample was sent for laboratory analysis. IMPRESSION: Successful ultrasound guided left thoracentesis yielding 2.3 L of pleural fluid. Electronically Signed   By: Aletta Edouard M.D.   On: 03/14/2018 16:13     ASSESSMENT AND PLAN:   63 year old female patient with history of cirrhosis of liver currently under hospitalist service for dyspnea secondary to pleural effusion  -Status post hypoxic respiratory failure secondary to large pleural  effusion Status post ultrasound-guided thoracentesis 2.3 L fluid removed  -Recurrent large left pleural effusion Status post thoracentesis Pulmonary consult Will need CT VS consult for  pleurodesis Follow-up echocardiogram  -History of depression Continue BuSpar and citalopram  -Cirrhosis of liver Outpatient follow-up with GI Lasix and Aldactone will be resumed once blood pressure is stable  All the records are reviewed and case discussed with Care Management/Social Worker. Management plans discussed with the patient, family and they are in agreement.  CODE STATUS: Full code  DVT Prophylaxis: SCDs  TOTAL TIME TAKING CARE OF THIS PATIENT: 36 minutes.   POSSIBLE D/C IN 2-3 DAYS, DEPENDING ON CLINICAL CONDITION.  Saundra Shelling M.D on 03/15/2018 at 11:39 AM  Between 7am to 6pm - Pager - [phone]  After 6pm go to [email or web address] - password [email or web address] [facility]  Office  [phone]  CC: Primary care physician; Marnee Guarneri, MD  Note: This dictation was prepared with Dragon dictation along with smaller phrase technology. Any transcriptional errors that result from this process are unintentional.

## 2018-03-15 NOTE — Progress Notes (Signed)
*  PRELIMINARY RESULTS* Echocardiogram 2D Echocardiogram has been performed.  Kelly Mclean 03/15/2018, 10:59 AM

## 2018-03-15 NOTE — Progress Notes (Signed)
Per echo tech it was difficult to get reading of the heart due to large amount of plerual effusion. Per repeat ct scan, showing moderate to large amount of pleural effusion. Dr. Estanislado Pandy was made aware of all the results, questioned if patient may need another thoracentesis. No new orders at this time will continue to monitor closely

## 2018-03-15 NOTE — Progress Notes (Signed)
Notify MD about patient's code status, patient had a DNR form that was sign by her PCP. Talked to patient and clarify if this is what she wants and she confirm that she wanted to be DNR, code status change. RN will continue to monitor.

## 2018-03-15 NOTE — Evaluation (Signed)
Physical Therapy Evaluation Patient Details Name: Kelly Mclean MRN: [id number] DOB: Jul 06, 1955 Today's Date: 03/15/2018   History of Present Illness  Pt is a 63 y.o. female presenting to hospital 03/14/18 with SOB.  Pt admitted with acute hypoxic respiratory failure in setting of large L pleural effusion.  Pt s/p thoracentesis 03/14/18 (2.3 L yield of pleural fluid).  Pt part of [facility] program.  PMH includes anemia, asthma, cirrhosis, htn, and recurrent pleural effusion.  Clinical Impression  Prior to hospital admission, pt was independent with functional mobility.  Pt lives with her husband in 1st floor apt (no steps).  Currently pt is modified independent semi-supine to sit; CGA with transfers; and CGA ambulating a few feet bed to recliner.  Limited distance ambulating per discussion with pt's nurse d/t concerns for pt's respiratory status and POC still being determined.  Minimal SOB noted; pt reporting she has been walking to bathroom with staff assist but also reporting c/o generalized weakness.  HR WFL during session and O2 sats 93% or greater on room air during session.  Pt would benefit from skilled PT to address noted impairments and functional limitations (see below for any additional details).  Upon hospital discharge, recommend pt discharge with HHPT (of note, pt reports being part of PACE program).    Follow Up Recommendations Home health PT    Equipment Recommendations  Rolling walker with 5" wheels    Recommendations for Other Services       Precautions / Restrictions Precautions Precautions: Fall Restrictions Weight Bearing Restrictions: No      Mobility  Bed Mobility Overal bed mobility: Modified Independent             General bed mobility comments: Semi-supine to sit without any noted difficulties.  Transfers Overall transfer level: Needs assistance Equipment used: None Transfers: Sit to/from Stand Sit to Stand: Min guard         General transfer  comment: fairly strong steady stand and controlled descent sitting  Ambulation/Gait Ambulation/Gait assistance: Min guard Gait Distance (Feet): 3 Feet(bed to recliner) Assistive device: None Gait Pattern/deviations: Step-through pattern Gait velocity: mildly decreased   General Gait Details: appearing steady and safe  Science writer    Modified Rankin (Stroke Patients Only)       Balance Overall balance assessment: Needs assistance Sitting-balance support: No upper extremity supported;Feet supported Sitting balance-Leahy Scale: Normal Sitting balance - Comments: steady sitting reaching outside BOS   Standing balance support: No upper extremity supported Standing balance-Leahy Scale: Good Standing balance comment: steady standing reaching within BOS                             Pertinent Vitals/Pain Pain Assessment: No/denies pain    Home Living Family/patient expects to be discharged to:: Private residence Living Arrangements: Spouse/significant other Available Help at Discharge: Family Type of Home: Apartment(1st floor) Home Access: Level entry     Home Layout: One level Home Equipment: Walker - 4 wheels      Prior Function Level of Independence: Independent         Comments: Pt reports she does not use any AD for ambulation although her balance has been off recently (pt denies any falls in past 6 months).     Hand Dominance        Extremity/Trunk Assessment   Upper Extremity Assessment Upper Extremity Assessment: Generalized weakness  Lower Extremity Assessment Lower Extremity Assessment: Generalized weakness    Cervical / Trunk Assessment Cervical / Trunk Assessment: Normal  Communication   Communication: No difficulties  Cognition Arousal/Alertness: Awake/alert Behavior During Therapy: WFL for tasks assessed/performed Overall Cognitive Status: Within Functional Limits for tasks assessed                                         General Comments   Nursing cleared pt for participation in physical therapy (recommend limited mobility to chair though).  Pt agreeable to PT session.    Exercises     Assessment/Plan    PT Assessment Patient needs continued PT services  PT Problem List Decreased strength;Decreased activity tolerance;Decreased balance;Decreased mobility;Cardiopulmonary status limiting activity       PT Treatment Interventions DME instruction;Gait training;Functional mobility training;Therapeutic activities;Therapeutic exercise;Balance training;Patient/family education    PT Goals (Current goals can be found in the Care Plan section)  Acute Rehab PT Goals Patient Stated Goal: to improve breathing PT Goal Formulation: With patient Time For Goal Achievement: 03/29/18 Potential to Achieve Goals: Fair    Frequency Min 2X/week   Barriers to discharge        Co-evaluation               AM-PAC PT "6 Clicks" Mobility  Outcome Measure Help needed turning from your back to your side while in a flat bed without using bedrails?: None Help needed moving from lying on your back to sitting on the side of a flat bed without using bedrails?: None Help needed moving to and from a bed to a chair (including a wheelchair)?: A Little Help needed standing up from a chair using your arms (e.g., wheelchair or bedside chair)?: A Little Help needed to walk in hospital room?: A Little Help needed climbing 3-5 steps with a railing? : A Little 6 Click Score: 20    End of Session Equipment Utilized During Treatment: Gait belt Activity Tolerance: Patient tolerated treatment well Patient left: in chair;with chair alarm set;with nursing/sitter in room;Other (comment);with call bell/phone within reach(student nurse in room and reported she would give pt her phone, call bell, and tray table when she finished her assessment) Nurse Communication: Mobility  status;Precautions PT Visit Diagnosis: Muscle weakness (generalized) (M62.81);Difficulty in walking, not elsewhere classified (R26.2)    Time: 9381-0175 PT Time Calculation (min) (ACUTE ONLY): 14 min   Charges:   PT Evaluation $PT Eval Low Complexity: 1 Low         Melana Hingle, PT 03/15/18, 9:26 AM [phone]

## 2018-03-16 LAB — BASIC METABOLIC PANEL
Anion gap: 8 (ref 5–15)
BUN: 19 mg/dL (ref 8–23)
CO2: 24 mmol/L (ref 22–32)
CREATININE: 1.59 mg/dL — AB (ref 0.44–1.00)
Calcium: 8.3 mg/dL — ABNORMAL LOW (ref 8.9–10.3)
Chloride: 97 mmol/L — ABNORMAL LOW (ref 98–111)
GFR calc Af Amer: 40 mL/min — ABNORMAL LOW (ref >60)
GFR calc non Af Amer: 35 mL/min — ABNORMAL LOW (ref >60)
Glucose, Bld: 85 mg/dL (ref 70–99)
Potassium: 4.1 mmol/L (ref 3.5–5.1)
Sodium: 129 mmol/L — ABNORMAL LOW (ref 135–145)

## 2018-03-16 MED ORDER — ALBUTEROL SULFATE (2.5 MG/3ML) 0.083% IN NEBU
2.5000 mg | INHALATION_SOLUTION | Freq: Four times a day (QID) | RESPIRATORY_TRACT | Status: DC | PRN
  Administered 2018-03-17 – 2018-03-27 (×4): 2.5 mg via RESPIRATORY_TRACT
  Filled 2018-03-16 (×4): qty 3

## 2018-03-16 MED ORDER — CITALOPRAM HYDROBROMIDE 20 MG PO TABS
20.0000 mg | ORAL_TABLET | Freq: Every day | ORAL | Status: DC
  Administered 2018-03-16 – 2018-03-28 (×13): 20 mg via ORAL
  Filled 2018-03-16 (×13): qty 1

## 2018-03-16 MED ORDER — LISINOPRIL 5 MG PO TABS: 2.5000 mg | ORAL_TABLET | Freq: Every day | ORAL | Status: DC

## 2018-03-16 MED ORDER — ATENOLOL 25 MG PO TABS
12.5000 mg | ORAL_TABLET | Freq: Every day | ORAL | Status: DC
  Administered 2018-03-17 – 2018-03-28 (×10): 12.5 mg via ORAL
  Filled 2018-03-16 (×12): qty 0.5

## 2018-03-16 MED ORDER — SPIRONOLACTONE 25 MG PO TABS
50.0000 mg | ORAL_TABLET | Freq: Every day | ORAL | Status: DC
  Administered 2018-03-16 – 2018-03-19 (×4): 50 mg via ORAL
  Filled 2018-03-16 (×4): qty 2

## 2018-03-16 MED ORDER — BUSPIRONE HCL 10 MG PO TABS
10.0000 mg | ORAL_TABLET | Freq: Three times a day (TID) | ORAL | Status: DC
  Administered 2018-03-16 – 2018-03-28 (×35): 10 mg via ORAL
  Filled 2018-03-16 (×38): qty 1

## 2018-03-16 MED ORDER — HYDROCERIN EX CREA
TOPICAL_CREAM | Freq: Two times a day (BID) | CUTANEOUS | Status: DC
  Administered 2018-03-17 – 2018-03-28 (×20): via TOPICAL
  Filled 2018-03-16: qty 113

## 2018-03-16 MED ORDER — GABAPENTIN 100 MG PO CAPS
200.0000 mg | ORAL_CAPSULE | Freq: Every day | ORAL | Status: DC
  Administered 2018-03-16 – 2018-03-27 (×12): 200 mg via ORAL
  Filled 2018-03-16 (×12): qty 2

## 2018-03-16 MED ORDER — PRIMIDONE 50 MG PO TABS
50.0000 mg | ORAL_TABLET | Freq: Two times a day (BID) | ORAL | Status: DC
  Administered 2018-03-16 – 2018-03-28 (×24): 50 mg via ORAL
  Filled 2018-03-16 (×27): qty 1

## 2018-03-16 MED ORDER — BOOST / RESOURCE BREEZE PO LIQD CUSTOM
1.0000 | Freq: Three times a day (TID) | ORAL | Status: DC
  Administered 2018-03-16 – 2018-03-25 (×15): 1 via ORAL

## 2018-03-16 MED ORDER — MONTELUKAST SODIUM 10 MG PO TABS
10.0000 mg | ORAL_TABLET | Freq: Every day | ORAL | Status: DC
  Administered 2018-03-16 – 2018-03-27 (×12): 10 mg via ORAL
  Filled 2018-03-16 (×12): qty 1

## 2018-03-16 MED ORDER — ATENOLOL 25 MG PO TABS: 12.5000 mg | ORAL_TABLET | Freq: Every day | ORAL | Status: DC

## 2018-03-16 MED ORDER — LISINOPRIL 5 MG PO TABS
2.5000 mg | ORAL_TABLET | Freq: Every day | ORAL | Status: DC
  Administered 2018-03-17 – 2018-03-22 (×3): 2.5 mg via ORAL
  Filled 2018-03-16 (×6): qty 1

## 2018-03-16 MED ORDER — FAMOTIDINE 20 MG PO TABS
40.0000 mg | ORAL_TABLET | Freq: Every day | ORAL | Status: DC
  Administered 2018-03-16 – 2018-03-17 (×2): 40 mg via ORAL
  Filled 2018-03-16 (×2): qty 2

## 2018-03-16 NOTE — Progress Notes (Addendum)
[facility] NAME: Kelly Mclean    MR#:  [id number]  DATE OF BIRTH:  1955/07/01  SUBJECTIVE:  CHIEF COMPLAINT:   Chief Complaint  Patient presents with  . Shortness of Breath  Patient seen and evaluated today Has decreased shortness of breathing Diuresing ok No complaints of any chest pain   REVIEW OF SYSTEMS:    ROS  CONSTITUTIONAL: No documented fever. No fatigue, weakness. No weight gain, no weight loss.  EYES: No blurry or double vision.  ENT: No tinnitus. No postnasal drip. No redness of the oropharynx.  RESPIRATORY: No cough, no wheeze, no hemoptysis.  Decreased dyspnea.  CARDIOVASCULAR: No chest pain. No orthopnea. No palpitations. No syncope.  GASTROINTESTINAL: No nausea, no vomiting or diarrhea. No abdominal pain. No melena or hematochezia.  GENITOURINARY: No dysuria or hematuria.  ENDOCRINE: No polyuria or nocturia. No heat or cold intolerance.  HEMATOLOGY: No anemia. No bruising. No bleeding.  INTEGUMENTARY: No rashes. No lesions.  MUSCULOSKELETAL: No arthritis. No swelling. No gout.  NEUROLOGIC: No numbness, tingling, or ataxia. No seizure-type activity.  PSYCHIATRIC: No anxiety. No insomnia. No ADD.   DRUG ALLERGIES:   Allergies  Allergen Reactions  . Biaxin [Clarithromycin] Shortness Of Breath and Rash  . Penicillins Anaphylaxis and Other (See Comments)    Did it involve swelling of the face/tongue/throat, SOB, or low BP? Unknown Did it involve sudden or severe rash/hives, skin peeling, or any reaction on the inside of your mouth or nose? Unknown Did you need to seek medical attention at a hospital or doctor's office? Yes When did it last happen?childhood If all above answers are "NO", may proceed with cephalosporin use.    Marland Kitchen Zoloft [Sertraline Hcl] Other (See Comments)    Reaction: "made crazy"  . Milk-Related Compounds Diarrhea    VITALS:  Blood pressure 126/62, pulse 85, temperature 98.7 F  (37.1 C), temperature source Oral, resp. rate 18, height 5' 2"  (1.575 m), weight 70.1 kg, SpO2 93 %.  PHYSICAL EXAMINATION:   Physical Exam  GENERAL:  63 y.o.-year-old patient lying in the bed with no acute distress.  EYES: Pupils equal, round, reactive to light and accommodation. No scleral icterus. Extraocular muscles intact.  HEENT: Head atraumatic, normocephalic. Oropharynx and nasopharynx clear.  NECK:  Supple, no jugular venous distention. No thyroid enlargement, no tenderness.  LUNGS: Improved breath sounds bilaterally, decreased bibasilar crepitations. No use of accessory muscles of respiration.  CARDIOVASCULAR: S1, S2 normal. No murmurs, rubs, or gallops.  ABDOMEN: Soft, nontender, nondistended. Bowel sounds present. No organomegaly or mass.  EXTREMITIES: No cyanosis, clubbing or edema b/l.    NEUROLOGIC: Cranial nerves II through XII are intact. No focal Motor or sensory deficits b/l.   PSYCHIATRIC: The patient is alert and oriented x 3.  SKIN: No obvious rash, lesion, or ulcer.   LABORATORY PANEL:   CBC Recent Labs  Lab 03/15/18 0451  WBC 3.6*  HGB 8.7*  HCT 25.9*  PLT 78*   ------------------------------------------------------------------------------------------------------------------ Chemistries  Recent Labs  Lab 03/15/18 0451 03/16/18 0518  NA 129* 129*  K 4.6 4.1  CL 97* 97*  CO2 26 24  GLUCOSE 72 85  BUN 19 19  CREATININE 1.65* 1.59*  CALCIUM 8.7* 8.3*  AST 29  --   ALT 20  --   ALKPHOS 51  --   BILITOT 1.8*  --    ------------------------------------------------------------------------------------------------------------------  Cardiac Enzymes No results for input(s): TROPONINI in the last 168 hours. ------------------------------------------------------------------------------------------------------------------  RADIOLOGY:  Dg Chest 1 View  Result Date: 03/14/2018 CLINICAL DATA:  Status post thoracentesis EXAM: CHEST  1 VIEW COMPARISON:   March 14, 2018 12:56 p.m. FINDINGS: The heart size and mediastinal contours are within normal limits. There is small left pleural effusion significantly decreased compared prior exam. Consolidation left lung base is noted. There is no pneumothorax. The right lung is clear. The visualized skeletal structures are stable. IMPRESSION: There is small left pleural effusion, significantly decreased compared prior exam. Consolidation of left lung base is identified. There is no pneumothorax. Electronically Signed   By: Abelardo Diesel M.D.   On: 03/14/2018 16:19   Ct Chest Wo Contrast  Result Date: 03/14/2018 CLINICAL DATA:  Pleural effusion and dyspnea since yesterday. EXAM: CT CHEST WITHOUT CONTRAST TECHNIQUE: Multidetector CT imaging of the chest was performed following the standard protocol without IV contrast. COMPARISON:  03/14/2018 CXR FINDINGS: Cardiovascular: Common arterial branch of the right brachiocephalic left common carotid arteries. Atherosclerosis of left subclavian artery origin. Nonaneurysmal atherosclerotic aorta. The unenhanced pulmonary vessels are unremarkable. The heart size is normal with small anterior pericardial effusion without thickening noted. Mediastinum/Nodes: Small subcentimeter prevascular and paratracheal lymph nodes. 1 cm short axis subcarinal lymph node. Patent trachea and mainstem bronchi. Unremarkable CT appearance of the esophagus. The thyroid gland is unremarkable without dominant mass. Lungs/Pleura: Moderate to large layering left effusion with adjacent atelectasis. The right lung is clear. No pneumothorax or dominant mass. Upper Abdomen: Cirrhotic appearance of the liver with moderate to large volume of ascites. Musculoskeletal: No chest wall mass or suspicious bone lesions identified. Mild thoracic spondylosis. IMPRESSION: 1. Moderate to large layering left pleural effusion with adjacent atelectasis. 2. Cirrhotic liver with moderate to large volume of ascites. Aortic  Atherosclerosis (ICD10-I70.0). Electronically Signed   By: Ashley Royalty M.D.   On: 03/14/2018 22:17   Dg Chest Portable 1 View  Result Date: 03/14/2018 CLINICAL DATA:  Shortness of breath. EXAM: PORTABLE CHEST 1 VIEW COMPARISON:  Chest x-ray 03/06/2018. FINDINGS: Mediastinum and hilar structures normal. Large left pleural effusion, increased in size from prior exam. Underlying left lung atelectasis/infiltrate can not be excluded. No pneumothorax. Heart size most likely stable. IMPRESSION: Large left pleural effusion, increased from prior exam. Electronically Signed   By: Marcello Moores  Register   On: 03/14/2018 13:19   US Thoracentesis Asp Pleural Space W/img Guide  Result Date: 03/14/2018 CLINICAL DATA:  Recurrent left pleural effusion. EXAM: ULTRASOUND GUIDED LEFT THORACENTESIS COMPARISON:  None. PROCEDURE: An ultrasound guided thoracentesis was thoroughly discussed with the patient and questions answered. The benefits, risks, alternatives and complications were also discussed. The patient understands and wishes to proceed with the procedure. Written consent was obtained. Ultrasound was performed to localize and mark an adequate pocket of fluid in the left chest. The area was then prepped and draped in the normal sterile fashion. 1% Lidocaine was used for local anesthesia. Under ultrasound guidance a 6 French Safe-T-Centesis catheter was introduced. Thoracentesis was performed. The catheter was removed and a dressing applied. COMPLICATIONS: None FINDINGS: A total of approximately 2.3 L of cloudy, yellowish-brown fluid was removed. A fluid sample was sent for laboratory analysis. IMPRESSION: Successful ultrasound guided left thoracentesis yielding 2.3 L of pleural fluid. Electronically Signed   By: Aletta Edouard M.D.   On: 03/14/2018 16:13     ASSESSMENT AND PLAN:   63 year old female patient with history of cirrhosis of liver currently under hospitalist service for dyspnea secondary to pleural  effusion  -Status post hypoxic respiratory failure  secondary to large pleural effusion Status post ultrasound-guided thoracentesis 2.3 L fluid removed in this hospitalization  -Recurrent large left pleural effusion Status post thoracentesis Pulmonary consult appreciated Probably from decompensated liver disease Follow-up echocardiogram Continue diuresis with Lasix and Aldactone Gastroenterology consulted for hepatic hydrothorax Monitor renal function If needed CT VS consult for pleurodesis  -History of depression Continue BuSpar and citalopram  -Cirrhosis of liver probably from fatty liver disease No evidence of spontaneous bacterial peritonitis More lymphocytes noted Lasix and Aldactone on board  All the records are reviewed and case discussed with Care Management/Social Worker. Management plans discussed with the patient, family and they are in agreement.  CODE STATUS: Full code  DVT Prophylaxis: SCDs  TOTAL TIME TAKING CARE OF THIS PATIENT: 37 minutes.   POSSIBLE D/C IN 2-3 DAYS, DEPENDING ON CLINICAL CONDITION.  Saundra Shelling M.D on 03/16/2018 at 10:35 AM  Between 7am to 6pm - Pager - [phone]  After 6pm go to [email or web address] - password [email or web address] [facility]  Office  [phone]  CC: Primary care physician; Marnee Guarneri, MD  Note: This dictation was prepared with Dragon dictation along with smaller phrase technology. Any transcriptional errors that result from this process are unintentional.

## 2018-03-16 NOTE — Consult Note (Addendum)
Kelly Lame, MD [address] [place], [place] [address] Phone: [phone] Fax : [phone]  Consultation  Referring Provider:     Dr. Estanislado Pandy Primary Care Physician:  Marnee Guarneri, MD Primary Gastroenterologist: Althia Forts        Reason for Consultation:     Ascites  Date of Admission:  03/14/2018 Date of Consultation:  03/16/2018         HPI:   Kelly Mclean is a 63 y.o. female who reports that she was told that she had scarring of her liver many years ago and fatty liver.  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  The patient has had fluid removed from her abdomen.  She denies any alcohol abuse since she was a teenager.  The patient is originally from [place] and has moved here and has not had any follow-up with any gastroenterologist.  The patient had a's CT scan of the abdomen that showed a moderate amount of ascites.  All the labs that we have on this patient from the last 3 weeks and show a creatinine between 1.59 and 1.79.  I am now being asked to see the patient for possible control of her ascites thereby decreasing the hepatic hydrothorax.  The patient reports that she has been losing weight and does not have a distended abdomen at the present time.  She does continue to report some shortness of breath.  Past Medical History:  Diagnosis Date  . Anemia   . Asthma   . Cirrhosis ([place])   . Hypertension   . Pleural effusion     Past Surgical History:  Procedure Laterality Date  . TUBAL LIGATION      Prior to Admission medications   Medication Sig Start Date End Date Taking? Authorizing Provider  albuterol (PROVENTIL HFA;VENTOLIN HFA) 108 (90 Base) MCG/ACT inhaler Inhale 2 puffs into the lungs every 6 (six) hours as needed for wheezing or shortness of breath.   Yes [provider]  atenolol (TENORMIN) 25 MG tablet Take 12.5 mg by mouth daily.   Yes [provider]    busPIRone (BUSPAR) 10 MG tablet Take 10 mg by mouth 3 (three) times daily.   Yes [provider]  calcium carbonate (TUMS - DOSED IN MG ELEMENTAL CALCIUM) 500 MG chewable tablet Chew 2 tablets by mouth 2 (two) times daily.   Yes [provider]  citalopram (CELEXA) 20 MG tablet Take 20 mg by mouth daily.   Yes [provider]  docusate sodium (COLACE) 100 MG capsule Take 100 mg by mouth 2 (two) times daily.   Yes [provider]  famotidine (PEPCID) 40 MG tablet Take 40 mg by mouth at bedtime.   Yes [provider]  gabapentin (NEURONTIN) 100 MG capsule Take 200 mg by mouth at bedtime.   Yes [provider]  lisinopril (PRINIVIL,ZESTRIL) 2.5 MG tablet Take 2.5 mg by mouth at bedtime.   Yes [provider]  montelukast (SINGULAIR) 10 MG tablet Take 10 mg by mouth at bedtime.   Yes [provider]  primidone (MYSOLINE) 50 MG tablet Take 50 mg by mouth 2 (two) times daily.   Yes [provider]  lidocaine (LIDODERM) 5 % Place 1 patch onto the skin every 12 (twelve) hours. Remove & Discard patch within 12 hours or as directed by MD 02/20/18 02/20/19  Nena Polio, MD    Family History  Problem Relation Age of Onset  . Breast cancer Maternal Aunt 84     Social History   Tobacco Use  . Smoking status: Former Smoker    Last attempt to quit: 1990    Years since quitting: 30.1  . Smokeless tobacco: Never Used  Substance Use Topics  . Alcohol use: Not Currently  . Drug use: Not Currently    Allergies as of 03/14/2018 - Review Complete 03/14/2018  Allergen Reaction Noted  . Biaxin [clarithromycin] Shortness Of Breath and Rash 02/19/2018  . Penicillins Anaphylaxis and Other (See Comments) 02/19/2018  . Zoloft [sertraline hcl] Other (See Comments) 02/19/2018  . Milk-related compounds Diarrhea 02/19/2018    Review of Systems:    All systems reviewed and negative except where noted in HPI.   Physical Exam:   Vital signs in last 24 hours: Temp:  [97.7 F (36.5 C)-98.6 F (37 C)] 97.7 F (36.5 C) (02/23 0417) Pulse Rate:  [69-87] 80 (02/23 0417) Resp:  [8-20] 20 (02/23 0417) BP: (112-130)/(54-70) 130/54 (02/23 0417) SpO2:  [96 %-100 %] 99 % (02/23 0417) Weight:  [70.1 kg] 70.1 kg (02/23 0417) Last BM Date: 03/14/18 General:   Pleasant, cooperative in NAD Head:  Normocephalic and atraumatic. Eyes:   No icterus.   Conjunctiva pink. PERRLA. Ears:  Normal auditory acuity. Neck:  Supple; no masses or thyroidomegaly Lungs: Respirations even and unlabored. Lungs clear to auscultation bilaterally.   No wheezes, crackles, or rhonchi.  Heart:  Regular rate and rhythm;  Without murmur, clicks, rubs or gallops Abdomen:  Soft, nondistended, nontender. Normal bowel sounds. No appreciable masses or hepatomegaly.  No rebound or guarding.  Rectal:  Not performed. Msk:  Symmetrical without gross deformities.    Extremities:  Without edema, cyanosis or clubbing. Neurologic:  Alert and oriented x3;  grossly normal neurologically. Skin:  Intact without significant lesions or rashes. Cervical Nodes:  No significant cervical adenopathy. Psych:  Alert and cooperative. Normal affect.  LAB RESULTS: Recent Labs    03/14/18 1301 03/15/18 0451  WBC 4.3 3.6*  HGB 10.1* 8.7*  HCT 29.9* 25.9*  PLT 105* 78*   BMET Recent Labs    03/14/18 1301 03/15/18 0451 03/16/18 0518  NA 127* 129* 129*  K 4.8 4.6 4.1  CL 94* 97* 97*  CO2 27 26 24   GLUCOSE 96 72 85  BUN 17 19 19   CREATININE 1.79* 1.65* 1.59*  CALCIUM 9.2 8.7* 8.3*   LFT Recent Labs    03/15/18 0451  PROT 5.5*  ALBUMIN 2.4*  AST 29  ALT 20  ALKPHOS 51  BILITOT 1.8*  BILIDIR 0.4*  IBILI 1.4*   PT/INR Recent Labs    03/14/18 1301  LABPROT 16.3*  INR 1.33    STUDIES: Dg Chest 1 View  Result Date: 03/14/2018 CLINICAL DATA:  Status post thoracentesis EXAM: CHEST  1 VIEW COMPARISON:  March 14, 2018 12:56 p.m. FINDINGS: The heart  size and mediastinal contours are within normal limits. There is small left pleural effusion significantly decreased compared prior exam. Consolidation left lung base is noted. There is no pneumothorax. The right lung is clear. The visualized skeletal structures are stable. IMPRESSION: There is small left pleural effusion, significantly decreased compared prior exam. Consolidation of left lung base is identified. There is no pneumothorax. Electronically Signed   By: Abelardo Diesel M.D.   On: 03/14/2018 16:19   Ct Chest Wo Contrast  Result Date: 03/14/2018 CLINICAL DATA:  Pleural effusion and dyspnea since yesterday. EXAM: CT CHEST  WITHOUT CONTRAST TECHNIQUE: Multidetector CT imaging of the chest was performed following the standard protocol without IV contrast. COMPARISON:  03/14/2018 CXR FINDINGS: Cardiovascular: Common arterial branch of the right brachiocephalic left common carotid arteries. Atherosclerosis of left subclavian artery origin. Nonaneurysmal atherosclerotic aorta. The unenhanced pulmonary vessels are unremarkable. The heart size is normal with small anterior pericardial effusion without thickening noted. Mediastinum/Nodes: Small subcentimeter prevascular and paratracheal lymph nodes. 1 cm short axis subcarinal lymph node. Patent trachea and mainstem bronchi. Unremarkable CT appearance of the esophagus. The thyroid gland is unremarkable without dominant mass. Lungs/Pleura: Moderate to large layering left effusion with adjacent atelectasis. The right lung is clear. No pneumothorax or dominant mass. Upper Abdomen: Cirrhotic appearance of the liver with moderate to large volume of ascites. Musculoskeletal: No chest wall mass or suspicious bone lesions identified. Mild thoracic spondylosis. IMPRESSION: 1. Moderate to large layering left pleural effusion with adjacent atelectasis. 2. Cirrhotic liver with moderate to large volume of ascites. Aortic Atherosclerosis (ICD10-I70.0). Electronically Signed    By: Ashley Royalty M.D.   On: 03/14/2018 22:17   Dg Chest Portable 1 View  Result Date: 03/14/2018 CLINICAL DATA:  Shortness of breath. EXAM: PORTABLE CHEST 1 VIEW COMPARISON:  Chest x-ray 03/06/2018. FINDINGS: Mediastinum and hilar structures normal. Large left pleural effusion, increased in size from prior exam. Underlying left lung atelectasis/infiltrate can not be excluded. No pneumothorax. Heart size most likely stable. IMPRESSION: Large left pleural effusion, increased from prior exam. Electronically Signed   By: Marcello Moores  Register   On: 03/14/2018 13:19   US Thoracentesis Asp Pleural Space W/img Guide  Result Date: 03/14/2018 CLINICAL DATA:  Recurrent left pleural effusion. EXAM: ULTRASOUND GUIDED LEFT THORACENTESIS COMPARISON:  None. PROCEDURE: An ultrasound guided thoracentesis was thoroughly discussed with the patient and questions answered. The benefits, risks, alternatives and complications were also discussed. The patient understands and wishes to proceed with the procedure. Written consent was obtained. Ultrasound was performed to localize and mark an adequate pocket of fluid in the left chest. The area was then prepped and draped in the normal sterile fashion. 1% Lidocaine was used for local anesthesia. Under ultrasound guidance a 6 French Safe-T-Centesis catheter was introduced. Thoracentesis was performed. The catheter was removed and a dressing applied. COMPLICATIONS: None FINDINGS: A total of approximately 2.3 L of cloudy, yellowish-brown fluid was removed. A fluid sample was sent for laboratory analysis. IMPRESSION: Successful ultrasound guided left thoracentesis yielding 2.3 L of pleural fluid. Electronically Signed   By: Aletta Edouard M.D.   On: 03/14/2018 16:13      Impression / Plan:   Assessment: Active Problems:   Acute respiratory failure (HCC)   Kalianne Fetting is a 63 y.o. y/o female with a history of fatty liver and being told many years ago that she has scarring of her  liver.  The patient now has been diagnosed with cirrhosis likely from fatty liver disease.  The patient cell count shows the fluid of the lung being mostly lymphocytes.  The albumin checked on 29 January of the pleural fluid was 2.2 and it went down to 1.4 on the subsequent tap on 21 February.  The corresponding albumin in the serum at that time was 3.3 and 2.4 respectively.  The SAAG for these values correspond to 1.1 and 1.0 respectively.  Of note, the patient's bilirubin is predominantly indirect and not from a liver source.  Plan:  This patient has a history of cirrhosis with ascites and fluid on the left representing a pleural effusion.  The fluid was reported to be a large left pleural effusion with the ascitic fluid showing only a moderate amount of ascites.  There is no sign of SBP and the lymphocyte count was high in the fluid.  Cytology was negative for malignancy.  The patient can have a trial of increasing the diuretics to see if the patient's renal function does not deteriorate.  The patient is borderline for portal hypertension as the cause of her fluid but in the light of her personal history of cirrhosis I would continue to treat as this being a sequela of ascites.  The patient has been explained the plan and agrees with it.  Thank you for involving me in the care of this patient.      LOS: 2 days   Kelly Lame, MD  03/16/2018, 6:40 AM    Note: This dictation was prepared with Dragon dictation along with smaller phrase technology. Any transcriptional errors that result from this process are unintentional.

## 2018-03-16 NOTE — Progress Notes (Signed)
Initial Nutrition Assessment  DOCUMENTATION CODES:   Not applicable  INTERVENTION:  Provide Boost Breeze po TID, each supplement provides 250 kcal and 9 grams of protein. Patient prefers berry or peach flavors.  Encouraged adequate intake of protein at meals. Discussed which foods contain protein.  NUTRITION DIAGNOSIS:   Inadequate oral intake related to decreased appetite, nausea as evidenced by per patient/family report.  GOAL:   Patient will meet greater than or equal to 90% of their needs  MONITOR:   PO intake, Supplement acceptance, Labs, Weight trends, I & O's  REASON FOR ASSESSMENT:   Malnutrition Screening Tool    ASSESSMENT:   63 year old female with PMHx of asthma, anemia, HTN, cirrhosis admitted with large recurrent pleural effusion s/p US-guided thoracentesis on 2/21 where 2.3 L fluid was removed.   Met with patient at bedside. She reports her appetite has been decreased for a few months now. She has been experiencing nausea but not emesis. She reports she is still eating 2-3 meals per day she has just been eating less at meals so her nausea does not worsen. She reports [facility] and they told her she may be lactose intolerant and intolerant to gluten. Therefore patient has removed these from her diet to see if it improves her diarrhea. She reports she is comfortable with ordering from menu and knows what to order. She has tried Ensure before and did not like it. She is aware it is lactose-free and reports she did not like the flavor. She is amenable to trying [employer].  Patient reports her UBW was 172 lbs and that she has lost at least 20 lbs over an unknown time frame. Patient is currently 70.1 kg (154.5 lbs). Very limited weight history in chart so unable to see a trend of weight loss. Findings on nutrition focused physical exam do not support a significant weight loss.  Medications reviewed and include: Colace 100 mg BID, Lasix 20 mg BID, spironolactone  50 mg daily.  Labs reviewed: Sodium 129, Chloride 97, Creatinine 1.59.  Patient does not meet criteria for malnutrition.  NUTRITION - FOCUSED PHYSICAL EXAM:    Most Recent Value  Orbital Region  No depletion  Upper Arm Region  No depletion  Thoracic and Lumbar Region  No depletion  Buccal Region  No depletion  Temple Region  No depletion  Clavicle Bone Region  No depletion  Clavicle and Acromion Bone Region  No depletion  Scapular Bone Region  No depletion  Dorsal Hand  No depletion  Patellar Region  No depletion  Anterior Thigh Region  No depletion  Posterior Calf Region  No depletion  Edema (RD Assessment)  None  Hair  Reviewed  Eyes  Reviewed  Mouth  Reviewed  Skin  Reviewed  Nails  Reviewed     Diet Order:   Diet Order            Diet regular Room service appropriate? Yes; Fluid consistency: Thin  Diet effective now             EDUCATION NEEDS:   Education needs have been addressed  Skin:  Skin Assessment: Reviewed RN Assessment  Last BM:  03/14/2018 per chart  Height:   Ht Readings from Last 1 Encounters:  03/14/18 _0  (1.575 m)   Weight:   Wt Readings from Last 1 Encounters:  03/16/18 70.1 kg   Ideal Body Weight:  50 kg  BMI:  Body mass index is 28.26 kg/m.  Estimated Nutritional Needs:   Kcal:  6767-2094  Protein:  85-95 grams  Fluid:  1.7 L/day  Willey Blade, MS, RD, LDN Office: [phone] Pager: [phone] After Hours/Weekend Pager: [phone]

## 2018-03-17 ENCOUNTER — Encounter: Payer: Self-pay | Admitting: Radiology

## 2018-03-17 ENCOUNTER — Inpatient Hospital Stay: Payer: Medicare (Managed Care)

## 2018-03-17 ENCOUNTER — Telehealth: Payer: Self-pay

## 2018-03-17 DIAGNOSIS — R188 Other ascites: Secondary | ICD-10-CM

## 2018-03-17 DIAGNOSIS — K769 Liver disease, unspecified: Secondary | ICD-10-CM

## 2018-03-17 DIAGNOSIS — J918 Pleural effusion in other conditions classified elsewhere: Secondary | ICD-10-CM

## 2018-03-17 LAB — COMPREHENSIVE METABOLIC PANEL
ALT: 22 U/L (ref 0–44)
ANION GAP: 7 (ref 5–15)
AST: 33 U/L (ref 15–41)
Albumin: 2.6 g/dL — ABNORMAL LOW (ref 3.5–5.0)
Alkaline Phosphatase: 67 U/L (ref 38–126)
BUN: 23 mg/dL (ref 8–23)
CO2: 23 mmol/L (ref 22–32)
Calcium: 8.3 mg/dL — ABNORMAL LOW (ref 8.9–10.3)
Chloride: 99 mmol/L (ref 98–111)
Creatinine, Ser: 1.67 mg/dL — ABNORMAL HIGH (ref 0.44–1.00)
GFR calc Af Amer: 38 mL/min — ABNORMAL LOW (ref >60)
GFR, EST NON AFRICAN AMERICAN: 33 mL/min — AB (ref >60)
Glucose, Bld: 86 mg/dL (ref 70–99)
Potassium: 4.2 mmol/L (ref 3.5–5.1)
Sodium: 129 mmol/L — ABNORMAL LOW (ref 135–145)
Total Bilirubin: 2 mg/dL — ABNORMAL HIGH (ref 0.3–1.2)
Total Protein: 5.8 g/dL — ABNORMAL LOW (ref 6.5–8.1)

## 2018-03-17 LAB — HIV ANTIBODY (ROUTINE TESTING W REFLEX): HIV Screen 4th Generation wRfx: NONREACTIVE

## 2018-03-17 MED ORDER — TECHNETIUM TC 99M MEBROFENIN IV KIT
5.1300 | PACK | Freq: Once | INTRAVENOUS | Status: AC | PRN
  Administered 2018-03-17: 5.13 via INTRAVENOUS

## 2018-03-17 NOTE — Clinical Social Work Note (Signed)
PT recommended home health, [facility] program following patient, CSW signing off, please reconsult if patient changes her mind.

## 2018-03-17 NOTE — Care Management Important Message (Signed)
Copy of signed Medicare IM left with patient in room. 

## 2018-03-17 NOTE — Progress Notes (Signed)
* [facility]     Assessment and Plan:  Liver cirrhosis. Recurrent left hepatic hydrothorax.  -Continue management per GI to try to reduce recurrence of left pleural effusion. - Continue periodic drainage of left pleural effusion when patient is significantly short of breath.  Would not recommend routine drainage of the left pleural effusion unless the patient is significantly symptomatic.  This can be arranged outpatient through IR. - Placement of indwelling pleural catheters or chemical pleurodesis is not typically recommended for left recurrent hepatic hydro-thorax unless in end-stage patients is on a palliative basis.  This is due to complications and/or inability to stop the pleural fluid from reaccumulating. -We will arrange for outpatient follow-up in about 2 weeks with repeat chest x-ray.  Date: 03/17/2018  MRN# [id number] Kelly Mclean 1955-08-07   Kelly Mclean is a 63 y.o. old female seen in follow up for chief complaint of  Chief Complaint  Patient presents with  . Shortness of Breath     HPI:  The patient is a 63 year old female with progressive dyspnea for the past few weeks, she was found to have a large left pleural effusion, and underwent thoracentesis with removal of 2.2 L of pleural fluid, with improvement in her dyspnea.  Thoracentesis was repeated on 2/13 and 2/21.  Patient has a apparent remote history of cirrhosis.  Gastroenterology has been consulted the patient has been started on diuresis.  Cytology in the left pleural fluid was negative.  **CT chest 03/14/2018>> imaging personally reviewed moderate left-sided pleural effusion, elevated right-sided diaphragm, lungs are otherwise unremarkable.  Ascites is present in the abdomen with cirrhotic appearing liver.  Medication:    Current Facility-Administered Medications:  .  acetaminophen (TYLENOL) tablet 650 mg, 650 mg, Oral, Q6H PRN **OR** acetaminophen (TYLENOL) suppository 650 mg, 650  mg, Rectal, Q6H PRN, Mody, Sital, MD .  albuterol (PROVENTIL) (2.5 MG/3ML) 0.083% nebulizer solution 2.5 mg, 2.5 mg, Inhalation, Q6H PRN, Lance Coon, MD .  atenolol (TENORMIN) tablet 12.5 mg, 12.5 mg, Oral, Daily, Lance Coon, MD .  busPIRone (BUSPAR) tablet 10 mg, 10 mg, Oral, TID, Pyreddy, Pavan, MD, 10 mg at 03/16/18 2251 .  citalopram (CELEXA) tablet 20 mg, 20 mg, Oral, Daily, Pyreddy, Pavan, MD, 20 mg at 03/16/18 1732 .  docusate sodium (COLACE) capsule 100 mg, 100 mg, Oral, BID, Lance Coon, MD, 100 mg at 03/16/18 2251 .  famotidine (PEPCID) tablet 40 mg, 40 mg, Oral, QHS, Lance Coon, MD, 40 mg at 03/16/18 2251 .  feeding supplement (BOOST / RESOURCE BREEZE) liquid 1 Container, 1 Container, Oral, TID BM, Pyreddy, Pavan, MD, 1 Container at 03/16/18 1344 .  furosemide (LASIX) tablet 20 mg, 20 mg, Oral, BID, Pyreddy, Pavan, MD, 20 mg at 03/16/18 1703 .  gabapentin (NEURONTIN) capsule 200 mg, 200 mg, Oral, QHS, Lance Coon, MD, 200 mg at 03/16/18 2251 .  hydrocerin (EUCERIN) cream, , Topical, BID, Fritzi Mandes, MD .  HYDROcodone-acetaminophen (NORCO/VICODIN) 5-325 MG per tablet 1-2 tablet, 1-2 tablet, Oral, Q4H PRN, Mody, Sital, MD .  lisinopril (PRINIVIL,ZESTRIL) tablet 2.5 mg, 2.5 mg, Oral, QHS, Lance Coon, MD .  montelukast (SINGULAIR) tablet 10 mg, 10 mg, Oral, Corwin Levins, MD, 10 mg at 03/16/18 2251 .  ondansetron (ZOFRAN) tablet 4 mg, 4 mg, Oral, Q6H PRN, 4 mg at 03/16/18 0946 **OR** ondansetron (ZOFRAN) injection 4 mg, 4 mg, Intravenous, Q6H PRN, Mody, Sital, MD .  polyethylene glycol (MIRALAX / GLYCOLAX) packet 17 g, 17 g, Oral, Daily PRN, Bettey Costa, MD .  primidone (MYSOLINE) tablet 50 mg, 50 mg, Oral, BID, Lance Coon, MD, 50 mg at 03/16/18 2251 .  sodium chloride (OCEAN) 0.65 % nasal spray 1 spray, 1 spray, Each Nare, PRN, Pyreddy, Pavan, MD, 1 spray at 03/16/18 0414 .  spironolactone (ALDACTONE) tablet 50 mg, 50 mg, Oral, Daily, Allen Norris, Darren, MD, 50 mg at  03/16/18 0940 .  trolamine salicylate (ASPERCREME) 10 % cream, , Topical, PRN, Bettey Costa, MD   Allergies:  Biaxin [clarithromycin]; Penicillins; Zoloft [sertraline hcl]; and Milk-related compounds  Review of Systems:  Constitutional: Feels well. Cardiovascular: Denies chest pain, exertional chest pain.  Pulmonary: Denies hemoptysis, pleuritic chest pain.   The remainder of systems were reviewed and were found to be negative other than what is documented in the HPI.    Physical Examination:   VS: BP 119/70 (BP Location: Right Arm)   Pulse 90   Temp 98.2 F (36.8 C) (Oral)   Resp 18   Ht 5' 2"  (1.575 m)   Wt 70.3 kg   SpO2 94%   BMI 28.35 kg/m   General Appearance: No distress  Neuro:without focal findings, mental status, speech normal, alert and oriented HEENT: PERRLA, EOM intact Pulmonary: No wheezing, No rales  CardiovascularNormal S1,S2.  No m/r/g.  Abdomen: Benign, Soft, non-tender, No masses Renal:  No costovertebral tenderness  GU:  No performed at this time. Endoc: No evident thyromegaly, no signs of acromegaly or Cushing features Skin:   warm, no rashes, no ecchymosis  Extremities: normal, no cyanosis, clubbing.      LABORATORY PANEL:   CBC Recent Labs  Lab 03/15/18 0451  WBC 3.6*  HGB 8.7*  HCT 25.9*  PLT 78*   ------------------------------------------------------------------------------------------------------------------  Chemistries  Recent Labs  Lab 03/17/18 0502  NA 129*  K 4.2  CL 99  CO2 23  GLUCOSE 86  BUN 23  CREATININE 1.67*  CALCIUM 8.3*  AST 33  ALT 22  ALKPHOS 67  BILITOT 2.0*   ------------------------------------------------------------------------------------------------------------------  Cardiac Enzymes No results for input(s): TROPONINI in the last 168 hours. ------------------------------------------------------------  RADIOLOGY:   No results found for this or any previous visit. Results for orders placed  during the hospital encounter of 02/19/18  DG Chest 2 View   Narrative CLINICAL DATA:  Pleuritic chest pain on the left.  EXAM: CHEST - 2 VIEW  COMPARISON:  02/19/2018  FINDINGS: Cardiac shadows within normal limits. The lungs are well aerated bilaterally. Small pleural effusions are again seen. Patchy opacity is noted in the left mid lung likely representing atelectasis. No pneumothorax is noted. No bony abnormality is noted.  IMPRESSION: Patchy atelectatic changes in the left mid lung.  Small bilateral pleural effusions stable from the previous exam.   Electronically Signed   By: Inez Catalina M.D.   On: 02/20/2018 10:33    ------------------------------------------------------------------------------------------------------------------  Thank  you for allowing [facility] to assist in the care of your patient. Our recommendations are noted above.  Please contact us if we can be of further service.   Marda Stalker, M.D., F.C.C.P.  Board Certified in Internal Medicine, Pulmonary Medicine, [place], and Sleep Medicine.  [facility] Office Number: [phone]  03/17/2018

## 2018-03-17 NOTE — Telephone Encounter (Signed)
Attempted to contact patient at home number, voicemail has not been set up yet. LMTCB at spouse cell phone number.   Laverle Hobby, MD  P [facility]  Caller: Unspecified (Today, 12:34 PM)        Pt needs hfu in about 2 weeks for pleural effusion. Needs repeat cxr day before appt.

## 2018-03-17 NOTE — Progress Notes (Signed)
[place] at [place] NAME: Latarra Eagleton    MR#:  [id number]  DATE OF BIRTH:  [birth year]  SUBJECTIVE:  CHIEF COMPLAINT:   Chief Complaint  Patient presents with  . Shortness of Breath  Patient seen and evaluated today Has no shortness of breath Diuresing ok No complaints of any chest pain   REVIEW OF SYSTEMS:    ROS  CONSTITUTIONAL: No documented fever. No fatigue, weakness. No weight gain, no weight loss.  EYES: No blurry or double vision.  ENT: No tinnitus. No postnasal drip. No redness of the oropharynx.  RESPIRATORY: No cough, no wheeze, no hemoptysis.  Decreased dyspnea.  CARDIOVASCULAR: No chest pain. No orthopnea. No palpitations. No syncope.  GASTROINTESTINAL: No nausea, no vomiting or diarrhea. No abdominal pain. No melena or hematochezia.  GENITOURINARY: No dysuria or hematuria.  ENDOCRINE: No polyuria or nocturia. No heat or cold intolerance.  HEMATOLOGY: No anemia. No bruising. No bleeding.  INTEGUMENTARY: No rashes. No lesions.  MUSCULOSKELETAL: No arthritis. No swelling. No gout.  NEUROLOGIC: No numbness, tingling, or ataxia. No seizure-type activity.  PSYCHIATRIC: No anxiety. No insomnia. No ADD.   DRUG ALLERGIES:   Allergies  Allergen Reactions  . Biaxin [Clarithromycin] Shortness Of Breath and Rash  . Penicillins Anaphylaxis and Other (See Comments)    Did it involve swelling of the face/tongue/throat, SOB, or low BP? Unknown Did it involve sudden or severe rash/hives, skin peeling, or any reaction on the inside of your mouth or nose? Unknown Did you need to seek medical attention at a hospital or doctor's office? Yes When did it last happen?childhood If all above answers are "NO", may proceed with cephalosporin use.    Marland Kitchen Zoloft [Sertraline Hcl] Other (See Comments)    Reaction: "made crazy"  . Milk-Related Compounds Diarrhea    VITALS:  Blood pressure 119/70, pulse 90, temperature 98.2 F (36.8 C),  temperature source Oral, resp. rate 18, height 5' 2"  (1.575 m), weight 70.3 kg, SpO2 94 %.  PHYSICAL EXAMINATION:   Physical Exam  GENERAL:  63 y.o.-year-old patient lying in the bed with no acute distress.  EYES: Pupils equal, round, reactive to light and accommodation. No scleral icterus. Extraocular muscles intact.  HEENT: Head atraumatic, normocephalic. Oropharynx and nasopharynx clear.  NECK:  Supple, no jugular venous distention. No thyroid enlargement, no tenderness.  LUNGS: Improved breath sounds bilaterally, decreased bibasilar crepitations. No use of accessory muscles of respiration.  CARDIOVASCULAR: S1, S2 normal. No murmurs, rubs, or gallops.  ABDOMEN: Soft, nontender, nondistended. Bowel sounds present. No organomegaly or mass.  EXTREMITIES: No cyanosis, clubbing or edema b/l.    NEUROLOGIC: Cranial nerves II through XII are intact. No focal Motor or sensory deficits b/l.   PSYCHIATRIC: The patient is alert and oriented x 3.  SKIN: No obvious rash, lesion, or ulcer.   LABORATORY PANEL:   CBC Recent Labs  Lab 03/15/18 0451  WBC 3.6*  HGB 8.7*  HCT 25.9*  PLT 78*   ------------------------------------------------------------------------------------------------------------------ Chemistries  Recent Labs  Lab 03/17/18 0502  NA 129*  K 4.2  CL 99  CO2 23  GLUCOSE 86  BUN 23  CREATININE 1.67*  CALCIUM 8.3*  AST 33  ALT 22  ALKPHOS 67  BILITOT 2.0*   ------------------------------------------------------------------------------------------------------------------  Cardiac Enzymes No results for input(s): TROPONINI in the last 168 hours. ------------------------------------------------------------------------------------------------------------------  RADIOLOGY:  No results found.   ASSESSMENT AND PLAN:   63 year old female patient with history of cirrhosis of liver currently  under hospitalist service for dyspnea secondary to pleural effusion  -Status  post hypoxic respiratory failure secondary to large pleural effusion Status post ultrasound-guided thoracentesis 2.3 L fluid removed in this hospitalization  -Recurrent large left pleural effusion Status post thoracentesis Pulmonary consult appreciated Probably from decompensated liver disease Follow-up echocardiogram Continue diuresis with Lasix and Aldactone Monitor electrolytes Gastroenterology consulted for hepatic hydrothorax Nuclear medicine test today for hepatobiliary leak evaluation Monitor renal function  -History of depression Continue BuSpar and citalopram  -Cirrhosis of liver probably from fatty liver disease No evidence of spontaneous bacterial peritonitis More lymphocytes noted Lasix and Aldactone on board will continue  All the records are reviewed and case discussed with Care Management/Social Worker. Management plans discussed with the patient, family and they are in agreement.  CODE STATUS: Full code  DVT Prophylaxis: SCDs  TOTAL TIME TAKING CARE OF THIS PATIENT: 35 minutes.   POSSIBLE D/C IN 2-3 DAYS, DEPENDING ON CLINICAL CONDITION.  Saundra Shelling M.D on 03/17/2018 at 11:57 AM  Between 7am to 6pm - Pager - [phone]  After 6pm go to [email or web address] - password [email or web address] [facility]  Office  [phone]  CC: Primary care physician; Marnee Guarneri, MD  Note: This dictation was prepared with Dragon dictation along with smaller phrase technology. Any transcriptional errors that result from this process are unintentional.

## 2018-03-17 NOTE — Progress Notes (Signed)
[name] , MD [address] [place], [place], [place], [address] [address] [place], [place], [place], [address] Phone: [phone]  Fax: [phone]   Aolanis Crispen is being followed for possible hepatic hydrothorax  Subjective: No new complaints    Objective: Vital signs in last 24 hours: Vitals:   03/16/18 1654 03/16/18 1908 03/17/18 0403 03/17/18 0741  BP: (!) 148/68 (!) 126/58 (!) 124/54 119/70  Pulse: 92 97 89 90  Resp: 18 20 19 18   Temp: 98.4 F (36.9 C) 98.6 F (37 C) 98.4 F (36.9 C) 98.2 F (36.8 C)  TempSrc:  Oral Oral Oral  SpO2: 100% 94% 95% 94%  Weight:   70.3 kg   Height:       Weight change: 0.229 kg  Intake/Output Summary (Last 24 hours) at 03/17/2018 1022 Last data filed at 03/17/2018 0500 Gross per 24 hour  Intake 240 ml  Output 1150 ml  Net -910 ml     Exam: Heart:: Regular rate and rhythm, S1S2 present or without murmur or extra heart sounds Lungs: decreased air entry b/l  Abdomen: soft, nontender, normal bowel sounds  [id number] Lab Results: @LABTEST2 @ Micro Results: Recent Results (from the past 240 hour(s))  Body fluid culture     Status: None (Preliminary result)   Collection Time: 03/14/18  3:50 PM  Result Value Ref Range Status   Specimen Description   Final    PLEURAL Performed at [facility], [address], [place], [place] [address]    Special Requests   Final    NONE Performed at [facility], [address], [place], [place] [address]    Gram Stain NO WBC SEEN NO ORGANISMS SEEN   Final   Culture   Final    NO GROWTH 3 DAYS Performed at [facility], [place] [address], [place], [place] [address]    Report Status PENDING  Incomplete   Studies/Results: No results found. Medications: I have reviewed the patient's current medications. Scheduled Meds: . atenolol  12.5 mg Oral Daily  . busPIRone  10 mg Oral TID  . citalopram  20 mg Oral Daily  . docusate sodium  100 mg Oral  BID  . famotidine  40 mg Oral QHS  . feeding supplement  1 Container Oral TID BM  . furosemide  20 mg Oral BID  . gabapentin  200 mg Oral QHS  . hydrocerin   Topical BID  . lisinopril  2.5 mg Oral QHS  . montelukast  10 mg Oral QHS  . primidone  50 mg Oral BID  . spironolactone  50 mg Oral Daily   Continuous Infusions: PRN Meds:.acetaminophen **OR** acetaminophen, albuterol, HYDROcodone-acetaminophen, ondansetron **OR** ondansetron (ZOFRAN) IV, polyethylene glycol, sodium chloride, trolamine salicylate   Assessment: Active Problems:   Acute respiratory failure ([place])  Cato Mulligan 63 y.o. female history of recurrent pleural effusions and liver cirrhosis.  Presented to the emergency room last week with shortness of breath and recurrence of pleural effusion.  She has a history of alcohol abuse.  Cytology of the fluid is negative for malignancy.  CT scan of the abdomen and pelvis on 03/06/2018 showed features of cirrhosis, portal hypertension.  Large left pleural effusion. Evaluation of ascites fluid demonstrated  on 03/14/2018 the ascetic fluid had a albumin level of 1.4 and a serum albumin checked the next day was 2.4 and hence the SAAG would be 1.0 which suggests based on this calculation that the etiology of the ascites  is not portal hypertension but clinically it is very suspicious of the same, this value could be skewed if the patient has received albumin around this time.  Plan: 1. Suggest that we obtain a shunt study where a dye is injected in the abdomen and if this dye is traced and reaches the pleural space then it would suggest that the etiology of the pleural effusion is arising from the abdomen.  At that point of time we can decide  to repeat the SAAG off all IV infusions of any albumin and if it suggest portal hypertension as a source then can consider a TIPS procedure.  I have discussed this plan with Dr. Estanislado Pandy  2.  In the meanwhile suggest a low-sodium diet with sodium of  less than 2 g/day.  3.  Continue Lasix and Aldactone.  After a few days on the present dose can consider to increase if renal function permits.   LOS: 3 days   [name], MD 03/17/2018, 10:22 AM

## 2018-03-17 NOTE — Telephone Encounter (Signed)
-----   Message from Laverle Hobby, MD sent at 03/17/2018 12:34 PM EST ----- Regarding: hfu Pt needs hfu in about 2 weeks for pleural effusion. Needs repeat cxr day before appt.

## 2018-03-18 LAB — BODY FLUID CULTURE
Culture: NO GROWTH
Gram Stain: NONE SEEN

## 2018-03-18 LAB — BASIC METABOLIC PANEL
Anion gap: 6 (ref 5–15)
BUN: 26 mg/dL — ABNORMAL HIGH (ref 8–23)
CO2: 24 mmol/L (ref 22–32)
Calcium: 8.1 mg/dL — ABNORMAL LOW (ref 8.9–10.3)
Chloride: 101 mmol/L (ref 98–111)
Creatinine, Ser: 2.04 mg/dL — ABNORMAL HIGH (ref 0.44–1.00)
GFR calc Af Amer: 30 mL/min — ABNORMAL LOW (ref >60)
GFR calc non Af Amer: 26 mL/min — ABNORMAL LOW (ref >60)
Glucose, Bld: 87 mg/dL (ref 70–99)
Potassium: 4.4 mmol/L (ref 3.5–5.1)
Sodium: 131 mmol/L — ABNORMAL LOW (ref 135–145)

## 2018-03-18 LAB — CYTOLOGY - NON PAP

## 2018-03-18 MED ORDER — FAMOTIDINE 20 MG PO TABS
20.0000 mg | ORAL_TABLET | Freq: Every day | ORAL | Status: DC
  Administered 2018-03-18 – 2018-03-27 (×10): 20 mg via ORAL
  Filled 2018-03-18 (×10): qty 1

## 2018-03-18 MED ORDER — DOCUSATE SODIUM 50 MG/5ML PO LIQD
100.0000 mg | Freq: Two times a day (BID) | ORAL | Status: DC
  Administered 2018-03-18 – 2018-03-27 (×10): 100 mg via ORAL
  Filled 2018-03-18 (×21): qty 10

## 2018-03-18 MED ORDER — FUROSEMIDE 20 MG PO TABS
20.0000 mg | ORAL_TABLET | Freq: Every day | ORAL | Status: DC
  Administered 2018-03-19 – 2018-03-28 (×8): 20 mg via ORAL
  Filled 2018-03-18 (×9): qty 1

## 2018-03-18 NOTE — Progress Notes (Addendum)
[place] at [place] NAME: Kelly Mclean    MR#:  [id number]  DATE OF BIRTH:  [birth year]  SUBJECTIVE:  CHIEF COMPLAINT:   Chief Complaint  Patient presents with  . Shortness of Breath  Patient seen and evaluated today Has  shortness of breath Put on oxygen via nasal canula Diuresing ok No complaints of any chest pain   REVIEW OF SYSTEMS:    ROS  CONSTITUTIONAL: No documented fever. No fatigue, weakness. No weight gain, no weight loss.  EYES: No blurry or double vision.  ENT: No tinnitus. No postnasal drip. No redness of the oropharynx.  RESPIRATORY: No cough, no wheeze, no hemoptysis. mild dyspnea.  CARDIOVASCULAR: No chest pain. No orthopnea. No palpitations. No syncope.  GASTROINTESTINAL: No nausea, no vomiting or diarrhea. No abdominal pain. No melena or hematochezia.  GENITOURINARY: No dysuria or hematuria.  ENDOCRINE: No polyuria or nocturia. No heat or cold intolerance.  HEMATOLOGY: No anemia. No bruising. No bleeding.  INTEGUMENTARY: No rashes. No lesions.  MUSCULOSKELETAL: No arthritis. No swelling. No gout.  NEUROLOGIC: No numbness, tingling, or ataxia. No seizure-type activity.  PSYCHIATRIC: No anxiety. No insomnia. No ADD.   DRUG ALLERGIES:   Allergies  Allergen Reactions  . Biaxin [Clarithromycin] Shortness Of Breath and Rash  . Penicillins Anaphylaxis and Other (See Comments)    Did it involve swelling of the face/tongue/throat, SOB, or low BP? Unknown Did it involve sudden or severe rash/hives, skin peeling, or any reaction on the inside of your mouth or nose? Unknown Did you need to seek medical attention at a hospital or doctor's office? Yes When did it last happen?childhood If all above answers are "NO", may proceed with cephalosporin use.    Marland Kitchen Zoloft [Sertraline Hcl] Other (See Comments)    Reaction: "made crazy"  . Milk-Related Compounds Diarrhea    VITALS:  Blood pressure (!) 126/53, pulse 75,  temperature 98.5 F (36.9 C), temperature source Oral, resp. rate 17, height 5' 2"  (1.575 m), weight 70.4 kg, SpO2 95 %.  PHYSICAL EXAMINATION:   Physical Exam  GENERAL:  63 y.o.-year-old patient lying in the bed with no acute distress.  EYES: Pupils equal, round, reactive to light and accommodation. No scleral icterus. Extraocular muscles intact.  HEENT: Head atraumatic, normocephalic. Oropharynx and nasopharynx clear.  NECK:  Supple, no jugular venous distention. No thyroid enlargement, no tenderness.  LUNGS: Improved breath sounds bilaterally, has bibasilar crepitations. No use of accessory muscles of respiration.  CARDIOVASCULAR: S1, S2 normal. No murmurs, rubs, or gallops.  ABDOMEN: Soft, nontender, nondistended. Bowel sounds present. No organomegaly or mass.  EXTREMITIES: No cyanosis, clubbing or edema b/l.    NEUROLOGIC: Cranial nerves II through XII are intact. No focal Motor or sensory deficits b/l.   PSYCHIATRIC: The patient is alert and oriented x 3.  SKIN: No obvious rash, lesion, or ulcer.   LABORATORY PANEL:   CBC Recent Labs  Lab 03/15/18 0451  WBC 3.6*  HGB 8.7*  HCT 25.9*  PLT 78*   ------------------------------------------------------------------------------------------------------------------ Chemistries  Recent Labs  Lab 03/17/18 0502 03/18/18 0328  NA 129* 131*  K 4.2 4.4  CL 99 101  CO2 23 24  GLUCOSE 86 87  BUN 23 26*  CREATININE 1.67* 2.04*  CALCIUM 8.3* 8.1*  AST 33  --   ALT 22  --   ALKPHOS 67  --   BILITOT 2.0*  --    ------------------------------------------------------------------------------------------------------------------  Cardiac Enzymes No results for input(s): TROPONINI in  the last 168 hours. ------------------------------------------------------------------------------------------------------------------  RADIOLOGY:  Nm Hepato Biliary Leak  Result Date: 03/17/2018 CLINICAL DATA:  Cholelithiasis. Nausea and vomiting.  Hepatic cirrhosis. EXAM: NUCLEAR MEDICINE HEPATOBILIARY IMAGING TECHNIQUE: Sequential images of the abdomen were obtained out to 60 minutes following intravenous administration of radiopharmaceutical. RADIOPHARMACEUTICALS:  5.1 mCi Tc-88m Choletec IV COMPARISON:  CT on 03/14/2018 FINDINGS: Delayed uptake and biliary excretion of activity by the liver is seen, consistent with known hepatic cirrhosis. Gallbladder activity is visualized, consistent with patency of cystic duct. Biliary activity passes into small bowel, consistent with patent common bile duct. IMPRESSION: No evidence of acute cholecystitis or biliary ductal dilatation. Hepatocellular dysfunction, consistent with hepatic cirrhosis. Electronically Signed   By: JEarle GellM.D.   On: 03/17/2018 14:44     ASSESSMENT AND PLAN:   63 year old female patient with history of cirrhosis of liver currently under hospitalist service for dyspnea secondary to pleural effusion  -Status post hypoxic respiratory failure secondary to large pleural effusion Status post ultrasound-guided thoracentesis 2.3 L fluid removed in this hospitalization  -Recurrent large left pleural effusion Status post thoracentesis Pulmonary consult appreciated Probably from decompensated liver disease Follow-up echocardiogram Continue diuresis with Lasix and Aldactone Monitor electrolytes Gastroenterology follow up appreciated Nuclear medicine test tomorrow for hepatothorax leak evaluation for shunt evaluation Hepatobiliary leak test ok Monitor renal function  -Acute Kidney injury Secondary to diuretics Decrease doe of lasix Continue aldactone  -History of depression Continue BuSpar and citalopram  -Cirrhosis of liver probably from fatty liver disease No evidence of spontaneous bacterial peritonitis More lymphocytes noted Lasix and Aldactone on board will continue  All the records are reviewed and case discussed with Care Management/Social Worker. Management  plans discussed with the patient, family and they are in agreement.  CODE STATUS: Full code  DVT Prophylaxis: SCDs  TOTAL TIME TAKING CARE OF THIS PATIENT: 34 minutes.   POSSIBLE D/C IN 2-3 DAYS, DEPENDING ON CLINICAL CONDITION.  PSaundra ShellingM.D on 03/18/2018 at 11:49 AM  Between 7am to 6pm - Pager - [phone]  After 6pm go to [email or web address] - password [email or web address]  Office  [phone] CC: Primary care physician; MMarnee Guarneri MD  Note: This dictation was prepared with Dragon dictation along with smaller phrase technology. Any transcriptional errors that result from this process are unintentional.

## 2018-03-18 NOTE — Progress Notes (Signed)
Physical Therapy Treatment Patient Details Name: Kelly Mclean MRN: [id number] DOB: March 13, 1955 Today's Date: 03/18/2018    History of Present Illness Pt is a 63 y.o. female presenting to hospital 03/14/18 with SOB.  Pt admitted with acute hypoxic respiratory failure in setting of large L pleural effusion.  Pt s/p thoracentesis 03/14/18 (2.3 L yield of pleural fluid).  Pt part of [facility] program.  PMH includes anemia, asthma, cirrhosis, htn, and recurrent pleural effusion.    PT Comments    Patient agreeable to PT session. Patient performs bed mobility and transfers with supervision. Ambulated without supplemental O2  150 feet with min guard. 3 standing rest breaks to check O2 and for rest. O2 sats dropped to 88% briefly at end of session. With seated rest and return of supplemental O2 sats rose to 93% when patient left. Patient will benefit from continued skilled PT to address her decreased endurance and strength for return home at prior functional level.       Follow Up Recommendations  Home health PT     Equipment Recommendations  None recommended by PT -patient has rollator if needed   Recommendations for Other Services       Precautions / Restrictions Precautions Precautions: Fall Restrictions Weight Bearing Restrictions: No    Mobility  Bed Mobility Overal bed mobility: Modified Independent             General bed mobility comments: bed mobility performed with increased time  Transfers Overall transfer level: Modified independent Equipment used: None Transfers: Sit to/from Stand Sit to Stand: Supervision            Ambulation/Gait Ambulation/Gait assistance: Min guard Gait Distance (Feet): 150 Feet Assistive device: None Gait Pattern/deviations: Step-to pattern Gait velocity: decreased   General Gait Details: steady, safe   Stairs             Wheelchair Mobility    Modified Rankin (Stroke Patients Only)       Balance Overall balance  assessment: Independent   Sitting balance-Leahy Scale: Normal     Standing balance support: No upper extremity supported Standing balance-Leahy Scale: Normal                              Cognition Arousal/Alertness: Awake/alert Behavior During Therapy: WFL for tasks assessed/performed Overall Cognitive Status: Within Functional Limits for tasks assessed                                        Exercises      General Comments        Pertinent Vitals/Pain Pain Assessment: No/denies pain    Home Living                      Prior Function            PT Goals (current goals can now be found in the care plan section) Acute Rehab PT Goals Patient Stated Goal: to improve breathing, return home PT Goal Formulation: With patient Time For Goal Achievement: 03/29/18 Potential to Achieve Goals: Good Progress towards PT goals: Progressing toward goals    Frequency    Min 2X/week      PT Plan Current plan remains appropriate    Co-evaluation              AM-PAC PT "6 Clicks" Mobility  Outcome Measure  Help needed turning from your back to your side while in a flat bed without using bedrails?: None Help needed moving from lying on your back to sitting on the side of a flat bed without using bedrails?: None Help needed moving to and from a bed to a chair (including a wheelchair)?: None Help needed standing up from a chair using your arms (e.g., wheelchair or bedside chair)?: None Help needed to walk in hospital room?: A Little Help needed climbing 3-5 steps with a railing? : A Little 6 Click Score: 22    End of Session Equipment Utilized During Treatment: Gait belt Activity Tolerance: Patient tolerated treatment well Patient left: in bed;with bed alarm set Nurse Communication: Mobility status PT Visit Diagnosis: Muscle weakness (generalized) (M62.81)     Time: 1350-1410 PT Time Calculation (min) (ACUTE ONLY): 20  min  Charges:  $Gait Training: 8-22 mins                     Oiva Dibari, PT, GCS 03/18/18,2:21 PM

## 2018-03-18 NOTE — Progress Notes (Signed)
Noted that a wrong test performed yesterday - HIDA scan will not help rule out a shunt from the abdomen to the chest. Spoken to Dr Posey Pronto in radiology and the shunt test will be performed tomorrow ie Wednesday.  Dr [name] MD,MRCP [facility]) Gastroenterology/Hepatology Pager: [phone]

## 2018-03-19 ENCOUNTER — Inpatient Hospital Stay: Payer: Medicare (Managed Care)

## 2018-03-19 LAB — QUANTIFERON-TB GOLD PLUS (RQFGPL)
QUANTIFERON TB1 AG VALUE: 0.02 [IU]/mL
QuantiFERON Mitogen Value: 0.58 IU/mL
QuantiFERON Nil Value: 0.02 IU/mL
QuantiFERON TB2 Ag Value: 0.02 IU/mL

## 2018-03-19 LAB — BODY FLUID CELL COUNT WITH DIFFERENTIAL
EOS FL: 0 %
EOS FL: 0 %
Lymphs, Fluid: 81 %
Lymphs, Fluid: 85 %
MONOCYTE-MACROPHAGE-SEROUS FLUID: 11 %
Monocyte-Macrophage-Serous Fluid: 4 %
Neutrophil Count, Fluid: 11 %
Neutrophil Count, Fluid: 8 %
Total Nucleated Cell Count, Fluid: 440 cu mm
Total Nucleated Cell Count, Fluid: 527 cu mm

## 2018-03-19 LAB — ALBUMIN, PLEURAL OR PERITONEAL FLUID
Albumin, Fluid: 1.1 g/dL
Albumin, Fluid: <1 g/dL

## 2018-03-19 LAB — QUANTIFERON-TB GOLD PLUS: QuantiFERON-TB Gold Plus: NEGATIVE

## 2018-03-19 MED ORDER — SODIUM CHLORIDE 0.9% FLUSH
3.0000 mL | Freq: Two times a day (BID) | INTRAVENOUS | Status: DC
  Administered 2018-03-19 – 2018-03-28 (×18): 3 mL via INTRAVENOUS

## 2018-03-19 MED ORDER — TECHNETIUM TC 99M SULFUR COLLOID
7.0000 | Freq: Once | INTRAVENOUS | Status: AC | PRN
  Administered 2018-03-19: 7 via INTRAVENOUS

## 2018-03-19 MED ORDER — SODIUM CHLORIDE 0.9% FLUSH: 3.0000 mL | INTRAVENOUS | Status: DC | PRN

## 2018-03-19 NOTE — Progress Notes (Signed)
Band-aide on thoracentesis site on left back, sub scapula, is clean and dry. Bandage on paracentesis site, right abdomin just below the breast, is also clean and dry.

## 2018-03-19 NOTE — Progress Notes (Signed)
[facility] NAME: Jaeanna Mccomber    MR#:  [id number]  DATE OF BIRTH:  November 02, 1955  SUBJECTIVE:  CHIEF COMPLAINT:   Chief Complaint  Patient presents with  . Shortness of Breath  Patient seen and evaluated today Gets shortness of breath on ambulation On oxygen via nasal canula On diuresis with lasix No complaints of any chest pain   REVIEW OF SYSTEMS:    ROS  CONSTITUTIONAL: No documented fever. No fatigue, weakness. No weight gain, no weight loss.  EYES: No blurry or double vision.  ENT: No tinnitus. No postnasal drip. No redness of the oropharynx.  RESPIRATORY: Mild cough, no wheeze, no hemoptysis. mild dyspnea.  CARDIOVASCULAR: No chest pain. No orthopnea. No palpitations. No syncope.  GASTROINTESTINAL: No nausea, no vomiting or diarrhea. No abdominal pain. No melena or hematochezia.  GENITOURINARY: No dysuria or hematuria.  ENDOCRINE: No polyuria or nocturia. No heat or cold intolerance.  HEMATOLOGY: No anemia. No bruising. No bleeding.  INTEGUMENTARY: No rashes. No lesions.  MUSCULOSKELETAL: No arthritis. No swelling. No gout.  NEUROLOGIC: No numbness, tingling, or ataxia. No seizure-type activity.  PSYCHIATRIC: No anxiety. No insomnia. No ADD.   DRUG ALLERGIES:   Allergies  Allergen Reactions  . Biaxin [Clarithromycin] Shortness Of Breath and Rash  . Penicillins Anaphylaxis and Other (See Comments)    Did it involve swelling of the face/tongue/throat, SOB, or low BP? Unknown Did it involve sudden or severe rash/hives, skin peeling, or any reaction on the inside of your mouth or nose? Unknown Did you need to seek medical attention at a hospital or doctor's office? Yes When did it last happen?childhood If all above answers are "NO", may proceed with cephalosporin use.    Marland Kitchen Zoloft [Sertraline Hcl] Other (See Comments)    Reaction: "made crazy"  . Milk-Related Compounds Diarrhea    VITALS:  Blood pressure (!)  109/45, pulse 72, temperature 98.5 F (36.9 C), temperature source Oral, resp. rate 16, height 5' 2"  (1.575 m), weight 71.1 kg, SpO2 95 %.  PHYSICAL EXAMINATION:   Physical Exam  GENERAL:  63 y.o.-year-old patient lying in the bed with no acute distress.  EYES: Pupils equal, round, reactive to light and accommodation. No scleral icterus. Extraocular muscles intact.  HEENT: Head atraumatic, normocephalic. Oropharynx and nasopharynx clear.  NECK:  Supple, no jugular venous distention. No thyroid enlargement, no tenderness.  LUNGS: Decreased breath sounds bilaterally, has bibasilar crepitations. No use of accessory muscles of respiration.  CARDIOVASCULAR: S1, S2 normal. No murmurs, rubs, or gallops.  ABDOMEN: Soft, nontender, nondistended. Bowel sounds present. No organomegaly or mass.  EXTREMITIES: No cyanosis, clubbing or edema b/l.    NEUROLOGIC: Cranial nerves II through XII are intact. No focal Motor or sensory deficits b/l.   PSYCHIATRIC: The patient is alert and oriented x 3.  SKIN: No obvious rash, lesion, or ulcer.   LABORATORY PANEL:   CBC Recent Labs  Lab 03/15/18 0451  WBC 3.6*  HGB 8.7*  HCT 25.9*  PLT 78*   ------------------------------------------------------------------------------------------------------------------ Chemistries  Recent Labs  Lab 03/17/18 0502 03/18/18 0328  NA 129* 131*  K 4.2 4.4  CL 99 101  CO2 23 24  GLUCOSE 86 87  BUN 23 26*  CREATININE 1.67* 2.04*  CALCIUM 8.3* 8.1*  AST 33  --   ALT 22  --   ALKPHOS 67  --   BILITOT 2.0*  --    ------------------------------------------------------------------------------------------------------------------  Cardiac Enzymes No results for input(s):  TROPONINI in the last 168 hours. ------------------------------------------------------------------------------------------------------------------  RADIOLOGY:  Nm Hepato Biliary Leak  Result Date: 03/17/2018 CLINICAL DATA:  Cholelithiasis.  Nausea and vomiting. Hepatic cirrhosis. EXAM: NUCLEAR MEDICINE HEPATOBILIARY IMAGING TECHNIQUE: Sequential images of the abdomen were obtained out to 60 minutes following intravenous administration of radiopharmaceutical. RADIOPHARMACEUTICALS:  5.1 mCi Tc-28m Choletec IV COMPARISON:  CT on 03/14/2018 FINDINGS: Delayed uptake and biliary excretion of activity by the liver is seen, consistent with known hepatic cirrhosis. Gallbladder activity is visualized, consistent with patency of cystic duct. Biliary activity passes into small bowel, consistent with patent common bile duct. IMPRESSION: No evidence of acute cholecystitis or biliary ductal dilatation. Hepatocellular dysfunction, consistent with hepatic cirrhosis. Electronically Signed   By: JEarle GellM.D.   On: 03/17/2018 14:44     ASSESSMENT AND PLAN:   63year old female patient with history of cirrhosis of liver currently under hospitalist service for dyspnea secondary to pleural effusion  -Status post hypoxic respiratory failure secondary to large pleural effusion Status post ultrasound-guided thoracentesis 2.3 L fluid removed in this hospitalization  -Recurrent large left pleural effusion Status post thoracentesis in this hospitalization Pulmonary consult appreciated  decompensated liver disease is the cause  echocardiogram : 60 to 65% with normal systolic function Continue diuresis with Lasix and Aldactone Monitor electrolytes Gastroenterology follow up appreciated Nuclear medicine test today for hepatothorax leak evaluation for shunt evaluation Hepatobiliary leak test ok Monitor renal function  -Acute Kidney injury Secondary to diuretics On low dose lasix Continue aldactone Monitor renal function  -History of depression Continue BuSpar and citalopram  -Cirrhosis of liver probably from fatty liver disease No evidence of spontaneous bacterial peritonitis More lymphocytes noted Lasix and Aldactone on board will  continue  All the records are reviewed and case discussed with Care Management/Social Worker. Management plans discussed with the patient, family and they are in agreement.  CODE STATUS: Full code  DVT Prophylaxis: SCDs  TOTAL TIME TAKING CARE OF THIS PATIENT: 35 minutes.   POSSIBLE D/C IN 2-3 DAYS, DEPENDING ON CLINICAL CONDITION.  PSaundra ShellingM.D on 03/19/2018 at 11:46 AM  Between 7am to 6pm - Pager - [phone]  After 6pm go to [email or web address] - password [email or web address]  Office  [phone] CC: Primary care physician; MMarnee Guarneri MD  Note: This dictation was prepared with Dragon dictation along with smaller phrase technology. Any transcriptional errors that result from this process are unintentional.

## 2018-03-19 NOTE — Progress Notes (Signed)
Physical Therapy Treatment Patient Details Name: Kelly Mclean MRN: [id number] DOB: 1955-11-22 Today's Date: 03/19/2018    History of Present Illness Pt is a 63 y.o. female presenting to hospital 03/14/18 with SOB.  Pt admitted with acute hypoxic respiratory failure in setting of large L pleural effusion.  Pt s/p thoracentesis 03/14/18 (2.3 L yield of pleural fluid).  Pt part of PACE program.  PMH includes anemia, asthma, cirrhosis, htn, and recurrent pleural effusion.    PT Comments    Patient agrees to walk. Reports she is having a little more trouble breathing today. Reports 4/10 on RPE scale. O2 sats at 95% on nasal canula. Seated on edge of bed sats at 93% on room air. When walking 20 feet sats dropped to 86%. Returned to room and sats returned to low 90%. Patient will benefit from continued skilled PT to address her functional limitations, decreased activity tolerance and weakness.       Follow Up Recommendations  Home health PT;Other (comment)(home oxygen)     Equipment Recommendations  None recommended by PT    Recommendations for Other Services       Precautions / Restrictions Precautions Precautions: Fall Restrictions Weight Bearing Restrictions: No    Mobility  Bed Mobility Overal bed mobility: Modified Independent             General bed mobility comments: bed mobility performed with increased time  Transfers Overall transfer level: Modified independent Equipment used: None Transfers: Sit to/from Stand Sit to Stand: Supervision         General transfer comment: fairly strong steady stand and controlled descent sitting  Ambulation/Gait Ambulation/Gait assistance: Min guard Gait Distance (Feet): 100 Feet Assistive device: None Gait Pattern/deviations: Step-to pattern Gait velocity: decreased   General Gait Details: steady, safe, reports legs feel a little weak today   Stairs             Wheelchair Mobility    Modified Rankin (Stroke  Patients Only)       Balance Overall balance assessment: Independent Sitting-balance support: No upper extremity supported;Feet supported Sitting balance-Leahy Scale: Normal Sitting balance - Comments: steady sitting reaching outside BOS   Standing balance support: No upper extremity supported Standing balance-Leahy Scale: Normal Standing balance comment: steady standing reaching within BOS                            Cognition Arousal/Alertness: Awake/alert Behavior During Therapy: WFL for tasks assessed/performed Overall Cognitive Status: Within Functional Limits for tasks assessed                                        Exercises Other Exercises Other Exercises: laq, marching in sitting x 10 reps bilaterally. Supine hip abd/add, SLR x 10 reps bilaterally.     General Comments        Pertinent Vitals/Pain Pain Assessment: No/denies pain    Home Living Family/patient expects to be discharged to:: Private residence Living Arrangements: Spouse/significant other Available Help at Discharge: Family Type of Home: Apartment Home Access: Level entry   Home Layout: One level Home Equipment: Environmental consultant - 4 wheels Additional Comments: reports she has a walker but does not use it    Prior Function        Comments: Pt reports she does not use any AD for ambulation although her balance has been off recently (pt  denies any falls in past 6 months).   PT Goals (current goals can now be found in the care plan section) Acute Rehab PT Goals Patient Stated Goal: to improve breathing, return home PT Goal Formulation: With patient Time For Goal Achievement: 03/29/18 Potential to Achieve Goals: Good    Frequency    Min 2X/week      PT Plan Current plan remains appropriate    Co-evaluation              AM-PAC PT "6 Clicks" Mobility   Outcome Measure  Help needed turning from your back to your side while in a flat bed without using bedrails?:  None Help needed moving from lying on your back to sitting on the side of a flat bed without using bedrails?: None Help needed moving to and from a bed to a chair (including a wheelchair)?: A Little Help needed standing up from a chair using your arms (e.g., wheelchair or bedside chair)?: A Little Help needed to walk in hospital room?: A Little Help needed climbing 3-5 steps with a railing? : A Little 6 Click Score: 20    End of Session Equipment Utilized During Treatment: Gait belt Activity Tolerance: Patient limited by fatigue;Other (comment)(limited by oxygen saturation level) Patient left: in bed;with bed alarm set;with call bell/phone within reach Nurse Communication: Mobility status PT Visit Diagnosis: Muscle weakness (generalized) (M62.81)     Time: 1040-1105 PT Time Calculation (min) (ACUTE ONLY): 25 min  Charges:  $Gait Training: 8-22 mins $Therapeutic Exercise: 8-22 mins                     [employer], PT, GCS 03/19/18,11:22 AM

## 2018-03-19 NOTE — Procedures (Signed)
Interventional Radiology Procedure:   Indications: Recurrent left pleural effusion.  Evaluate for connection between peritoneal space and left pleural space.  Procedure: US guided left thoracentesis ; US guided paracentesis ; Injection of nuclear medicine agent into peritoneal cavity   Findings: 1) Removed 1.1 liters of yellow milky fluid from left pleural space.  2) Removed 60 ml of milky yellow fluid from RUQ   Complications: None     EBL: Minimal   Plan: Nuclear medicine procedure immediately after peritoneal injection.     Parvin Stetzer R. Anselm Pancoast, MD  Pager: [phone]

## 2018-03-20 ENCOUNTER — Inpatient Hospital Stay: Payer: Medicare (Managed Care)

## 2018-03-20 DIAGNOSIS — J948 Other specified pleural conditions: Secondary | ICD-10-CM

## 2018-03-20 LAB — BASIC METABOLIC PANEL
Anion gap: 6 (ref 5–15)
BUN: 39 mg/dL — ABNORMAL HIGH (ref 8–23)
CO2: 23 mmol/L (ref 22–32)
Calcium: 8.5 mg/dL — ABNORMAL LOW (ref 8.9–10.3)
Chloride: 103 mmol/L (ref 98–111)
Creatinine, Ser: 2.57 mg/dL — ABNORMAL HIGH (ref 0.44–1.00)
GFR calc Af Amer: 22 mL/min — ABNORMAL LOW (ref >60)
GFR calc non Af Amer: 19 mL/min — ABNORMAL LOW (ref >60)
Glucose, Bld: 79 mg/dL (ref 70–99)
Potassium: 5 mmol/L (ref 3.5–5.1)
Sodium: 132 mmol/L — ABNORMAL LOW (ref 135–145)

## 2018-03-20 LAB — PATHOLOGIST SMEAR REVIEW

## 2018-03-20 LAB — TRIGLYCERIDES, BODY FLUIDS
Triglycerides, Fluid: 307 mg/dL
Triglycerides, Fluid: 328 mg/dL

## 2018-03-20 LAB — TSH: TSH: 1.982 u[IU]/mL (ref 0.350–4.500)

## 2018-03-20 MED ORDER — ADULT MULTIVITAMIN W/MINERALS CH
1.0000 | ORAL_TABLET | Freq: Every day | ORAL | Status: DC
  Filled 2018-03-20 (×8): qty 1

## 2018-03-20 MED ORDER — SPIRONOLACTONE 25 MG PO TABS
25.0000 mg | ORAL_TABLET | Freq: Every day | ORAL | Status: DC
  Administered 2018-03-21 – 2018-03-24 (×4): 25 mg via ORAL
  Filled 2018-03-20 (×4): qty 1

## 2018-03-20 MED ORDER — IOPAMIDOL (ISOVUE-300) INJECTION 61%
15.0000 mL | INTRAVENOUS | Status: AC
  Administered 2018-03-20 (×2): 15 mL via ORAL

## 2018-03-20 NOTE — Care Management Important Message (Signed)
Copy of signed Medicare IM left with patient in room. 

## 2018-03-20 NOTE — Progress Notes (Signed)
Nutrition Follow Up Note    DOCUMENTATION CODES:   Not applicable  INTERVENTION:   Provide Boost Breeze po TID, each supplement provides 250 kcal and 9 grams of protein. Patient prefers berry or peach flavors.  MVI daily   3g sodium restricted diet   NUTRITION DIAGNOSIS:   Inadequate oral intake related to decreased appetite, nausea as evidenced by per patient/family report.  GOAL:   Patient will meet greater than or equal to 90% of their needs  -progressing   MONITOR:   PO intake, Supplement acceptance, Labs, Weight trends, I & O's  ASSESSMENT:   63 year old female with PMHx of asthma, anemia, HTN, cirrhosis admitted with large recurrent pleural effusion s/p US-guided thoracentesis on 2/21 where 2.3 L fluid was removed.   Pt s/p thoracentesis 2/26 with 1.1L output   Pt with improved appetite and oral intake; pt eating 75-100% of meals and drinking Boost Breeze. Per chart pt with 3lb weight loss since admit likely r/t fluid changes. Recommend continue supplements; RD will add MVI. Will add 3g sodium restriction through [employer] [facility].    Medications reviewed and include: celexa, colace, pepcid, lasix, MVI, aldactone   Labs reviewed: Na 132(L), creat 2.57(H), BUN 39(H)  Diet Order:   Diet Order            Diet regular Room service appropriate? Yes; Fluid consistency: Thin  Diet effective now             EDUCATION NEEDS:   Education needs have been addressed  Skin:  Skin Assessment: Reviewed RN Assessment  Last BM:  2/26- type 7  Height:   Ht Readings from Last 1 Encounters:  03/14/18 5' 2"  (1.575 m)   Weight:   Wt Readings from Last 1 Encounters:  03/20/18 70 kg   Ideal Body Weight:  50 kg  BMI:  Body mass index is 28.23 kg/m.  Estimated Nutritional Needs:   Kcal:  7944-4619  Protein:  85-95 grams  Fluid:  1.7 L/day  Koleen Distance MS, RD, LDN Pager #- [phone] Office#- [phone] After Hours Pager: [phone]

## 2018-03-20 NOTE — Progress Notes (Signed)
Physical Therapy Treatment Patient Details Name: Kelly Mclean MRN: [id number] DOB: Sep 19, 1955 Today's Date: 03/20/2018    History of Present Illness Pt is a 63 y.o. female presenting to hospital 03/14/18 with SOB.  Pt admitted with acute hypoxic respiratory failure in setting of large L pleural effusion.  Pt s/p thoracentesis 03/14/18 (2.3 L yield of pleural fluid).  Pt part of PACE program.  PMH includes anemia, asthma, cirrhosis, htn, and recurrent pleural effusion.    PT Comments    Patient reports she feels better today, had another thoracentesis yesterday. Patient performed transfers and bed mobility with modified independence, rails and increased time. Ambulated 200 feet without ad, on room air. Sats did drop to mid 80% x2 during ambulation, with standing rest and pursed lip breathing, patient able to recover > 90%. Patient requires min guard during ambulation. No LOB. Patient will benefit from continued skilled PT to address her decreased activity tolerance and weakness to achieve maximal independence level.      Follow Up Recommendations  Home health PT;Other (comment)(home O2)     Equipment Recommendations  None recommended by PT    Recommendations for Other Services       Precautions / Restrictions Precautions Precautions: Fall Restrictions Weight Bearing Restrictions: No    Mobility  Bed Mobility Overal bed mobility: Modified Independent             General bed mobility comments: bed mobility performed with increased time  Transfers Overall transfer level: Modified independent Equipment used: None Transfers: Sit to/from Stand Sit to Stand: Supervision         General transfer comment: fairly strong steady stand and controlled descent sitting  Ambulation/Gait Ambulation/Gait assistance: Min guard Gait Distance (Feet): 200 Feet Assistive device: None Gait Pattern/deviations: Step-to pattern Gait velocity: decreased   General Gait Details: seady,  safe    Stairs             Wheelchair Mobility    Modified Rankin (Stroke Patients Only)       Balance Overall balance assessment: Independent Sitting-balance support: No upper extremity supported;Feet supported Sitting balance-Leahy Scale: Normal Sitting balance - Comments: steady sitting reaching outside BOS   Standing balance support: No upper extremity supported Standing balance-Leahy Scale: Good                              Cognition Arousal/Alertness: Awake/alert Behavior During Therapy: WFL for tasks assessed/performed Overall Cognitive Status: Within Functional Limits for tasks assessed                                        Exercises Other Exercises Other Exercises: seated LAQ and marching x 10 reps bilaterally    General Comments        Pertinent Vitals/Pain Pain Assessment: No/denies pain    Home Living Family/patient expects to be discharged to:: Private residence Living Arrangements: Spouse/significant other Available Help at Discharge: Family Type of Home: Apartment Home Access: Level entry   Home Layout: One level Home Equipment: Environmental consultant - 4 wheels Additional Comments: reports she has a walker but does not use it    Prior Function Level of Independence: Independent      Comments: Pt reports she does not use any AD for ambulation although her balance has been off recently (pt denies any falls in past 6 months).   PT  Goals (current goals can now be found in the care plan section) Acute Rehab PT Goals Patient Stated Goal: to improve breathing, return home PT Goal Formulation: With patient Time For Goal Achievement: 03/29/18 Potential to Achieve Goals: Good Progress towards PT goals: Progressing toward goals    Frequency    Min 2X/week      PT Plan Current plan remains appropriate    Co-evaluation              AM-PAC PT "6 Clicks" Mobility   Outcome Measure  Help needed turning from your  back to your side while in a flat bed without using bedrails?: None Help needed moving from lying on your back to sitting on the side of a flat bed without using bedrails?: None Help needed moving to and from a bed to a chair (including a wheelchair)?: A Little Help needed standing up from a chair using your arms (e.g., wheelchair or bedside chair)?: A Little Help needed to walk in hospital room?: A Little Help needed climbing 3-5 steps with a railing? : A Little 6 Click Score: 20    End of Session Equipment Utilized During Treatment: Gait belt Activity Tolerance: Patient tolerated treatment well Patient left: in bed;with bed alarm set;with call bell/phone within reach Nurse Communication: Mobility status PT Visit Diagnosis: Muscle weakness (generalized) (M62.81)     Time: 0375-4360 PT Time Calculation (min) (ACUTE ONLY): 18 min  Charges:  $Therapeutic Exercise: 8-22 mins                     Crystallee Werden, PT, GCS 03/20/18,12:00 PM

## 2018-03-20 NOTE — Progress Notes (Signed)
[facility] NAME: Jream Broyles    MR#:  [id number]  DATE OF BIRTH:  May 12, 1955  SUBJECTIVE:  CHIEF COMPLAINT:   Chief Complaint  Patient presents with  . 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 No fatigue, weakness. No weight gain, no weight loss.  EYES: No blurry or double vision.  ENT: No tinnitus. No postnasal drip. No redness of the oropharynx.  RESPIRATORY: Mild cough, no wheeze, no hemoptysis. mild dyspnea.  CARDIOVASCULAR: No chest pain. No orthopnea. No palpitations. No syncope.  GASTROINTESTINAL: No nausea, no vomiting or diarrhea. No abdominal pain. No melena or hematochezia.  GENITOURINARY: No dysuria or hematuria.  ENDOCRINE: No polyuria or nocturia. No heat or cold intolerance.  HEMATOLOGY: No anemia. No bruising. No bleeding.  INTEGUMENTARY: No rashes. No lesions.  MUSCULOSKELETAL: No arthritis. No swelling. No gout.  NEUROLOGIC: No numbness, tingling, or ataxia. No seizure-type activity.  PSYCHIATRIC: No anxiety. No insomnia. No ADD.   DRUG ALLERGIES:   Allergies  Allergen Reactions  . Biaxin [Clarithromycin] Shortness Of Breath and Rash  . Penicillins Anaphylaxis and Other (See Comments)    Did it involve swelling of the face/tongue/throat, SOB, or low BP? Unknown Did it involve sudden or severe rash/hives, skin peeling, or any reaction on the inside of your mouth or nose? Unknown Did you need to seek medical attention at a hospital or doctor's office? Yes When did it last happen?childhood If all above answers are "NO", may proceed with cephalosporin use.    Marland Kitchen  Zoloft [Sertraline Hcl] Other (See Comments)    Reaction: "made crazy"  . Milk-Related Compounds Diarrhea    VITALS:  Blood pressure (!) 127/55, pulse 70, temperature 98.6 F (37 C), temperature source Oral, resp. rate 12, height 5' 2"  (1.575 m), weight 70 kg, SpO2 96 %.  PHYSICAL EXAMINATION:   Physical Exam  GENERAL:  63 y.o.-year-old patient lying in the bed with no acute distress.  EYES: Pupils equal, round, reactive to light and accommodation. No scleral icterus. Extraocular muscles intact.  HEENT: Head atraumatic, normocephalic. Oropharynx and nasopharynx clear.  NECK:  Supple, no jugular venous distention. No thyroid enlargement, no tenderness.  LUNGS: Improved breath sounds bilaterally, has bibasilar crepitations. No use of accessory muscles of respiration.  CARDIOVASCULAR: S1, S2 normal. No murmurs, rubs, or gallops.  ABDOMEN: Soft, nontender, nondistended. Bowel sounds present. No organomegaly or mass.  EXTREMITIES: No cyanosis, clubbing or edema b/l.    NEUROLOGIC: Cranial nerves II through XII are intact. No focal Motor or sensory deficits b/l.   PSYCHIATRIC: The patient is alert and oriented x 3.  SKIN: No obvious rash, lesion, or ulcer.   LABORATORY PANEL:   CBC Recent Labs  Lab 03/15/18 0451  WBC 3.6*  HGB 8.7*  HCT 25.9*  PLT 78*   ------------------------------------------------------------------------------------------------------------------ Chemistries  Recent Labs  Lab 03/17/18 0502  03/20/18 0444  NA 129*   < > 132*  K 4.2   < > 5.0  CL 99   < > 103  CO2 23   < > 23  GLUCOSE 86   < > 79  BUN 23   < >  39*  CREATININE 1.67*   < > 2.57*  CALCIUM 8.3*   < > 8.5*  AST 33  --   --   ALT 22  --   --   ALKPHOS 67  --   --   BILITOT 2.0*  --   --    < > = values in this interval not displayed.   ------------------------------------------------------------------------------------------------------------------  Cardiac Enzymes No results for  input(s): TROPONINI in the last 168 hours. ------------------------------------------------------------------------------------------------------------------  RADIOLOGY:  [place] Intraoperative  Result Date: 03/19/2018 CLINICAL DATA:  Ultrasound was provided for use by the ordering physician, and a technical charge was applied by the performing facility.  No radiologist interpretation/professional services rendered.   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 Patient is being evaluated for a hepatic hydrothorax. EXAM: NUCLEAR MEDICINE SULFUR COLLOID PERITONEAL SCINTIGRAPHY TECHNIQUE: Ultrasound-guided left thoracentesis was performed. 1.1 L of left pleural fluid was removed but not all of the fluid was removed. Subsequently, an ultrasound-guided paracentesis was performed in the right upper quadrant. 60 mL of peritoneal fluid was removed. Technetium 99 M sulfur colloid was injected through the paracentesis catheter. Paracentesis catheter was removed. RADIOPHARMACEUTICALS:  7 millicuries technetium 99 M sulfur colloid COMPARISON:  Chest CT 03/14/2018 FINDINGS: Imaging was obtained over 1 hour. The initial images demonstrated radiopharmaceutical throughout the peritoneal cavity. The largest peritoneal pockets are in the right upper quadrant and left lower quadrant. Over time, radiopharmaceutical starts to accumulate in the left hemithorax. Findings are compatible with a trans diaphragmatic connection between the peritoneal cavity and the left pleural space. IMPRESSION: Study is positive for connection between the peritoneal space and left pleural space. 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 Electronically Signed   By: Markus Daft M.D.   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 Plan for  nuclear medicine ascites - hydrothorax examination. Plan for an ultrasound-guided paracentesis and injection of radiopharmaceutical into the ascites. Left thoracentesis was performed prior to this procedure. EXAM: ULTRASOUND GUIDED PARACENTESIS INJECTION OF RADIOPHARMACEUTICAL INTO ASCITES MEDICATIONS: None. COMPLICATIONS: None immediate. PROCEDURE: Informed written consent was obtained from the patient after a discussion of the risks, benefits and alternatives to treatment. A timeout was performed prior to the initiation of the procedure. Initial ultrasound scanning demonstrates a small amount of ascites within the right upper abdominal quadrant. The right upper abdomen was prepped and draped in the usual sterile fashion. 1% lidocaine with was used for local anesthesia. Following this, a 6 Fr Safe-T-Centesis catheter was introduced. An ultrasound image was saved for documentation purposes. The paracentesis was performed. 60 mL of opaque yellow fluid was removed. The radiopharmaceutical was injected through the Safe-T-Centesis catheter. Catheter was flushed with sterile saline. The catheter was removed and a dressing was applied. The patient tolerated the procedure well without immediate post procedural complication. FINDINGS: Small amount of fluid around the liver. Small amount of fluid in the left lower quadrant. 60 mL of fluid was removed from the right upper quadrant. The radiopharmaceutical was injected into the right upper quadrant. IMPRESSION: Successful ultrasound-guided paracentesis yielding 60 mL of ascites. Fluid was sent for labs. Radiopharmaceutical was injected through the paracentesis catheter for the nuclear medicine examination. Electronically Signed   By: Markus Daft M.D.   On: 03/19/2018 17:06   Dg Chest Port 1 View  Result Date: 03/19/2018 CLINICAL DATA:  Status post thoracentesis. EXAM: PORTABLE CHEST 1 VIEW COMPARISON:  03/14/2018 FINDINGS: 1702 hours. Moderate to large left pleural effusion  noted without evidence for pneumothorax. Left base collapse/consolidation. The cardio pericardial silhouette is enlarged. The visualized bony structures of the thorax are intact. Telemetry leads overlie the chest. IMPRESSION: 1. No evidence for pneumothorax status post thoracentesis. 2. Moderate to large left pleural effusion. Electronically Signed   By: Misty Stanley M.D.   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 Patient is being evaluated for a connection between the peritoneal and left pleural space with a nuclear medicine examination. Patient has a large amount of left pleural fluid on ultrasound examination. Plan to remove some of the left pleural fluid prior to the nuclear medicine examination. EXAM: ULTRASOUND GUIDED LEFT THORACENTESIS MEDICATIONS: None. COMPLICATIONS: None immediate. PROCEDURE: An ultrasound guided thoracentesis was thoroughly discussed with the patient and questions answered. The benefits, risks, alternatives and complications were also discussed. The patient understands and wishes to proceed with the procedure. Written consent was obtained. Ultrasound was performed to localize and mark an adequate pocket of fluid in the left chest. The area was then prepped and draped in the normal sterile fashion. 1% Lidocaine was used for local anesthesia. Under ultrasound guidance a 6 Fr Safe-T-Centesis catheter was introduced. Thoracentesis was performed. The catheter was removed and a dressing applied. FINDINGS: A total of approximately 1.1 L of opaque yellow fluid was removed. Samples were sent to the laboratory as requested by the clinical team. IMPRESSION: Successful ultrasound guided left thoracentesis yielding 1.1 L of pleural fluid. Electronically Signed   By: Markus Daft M.D.   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 Fluid has lot of lymphocytes need to rule out lymphoma Status post thoracentesis in this hospitalization Had a repeat thoracentesis paracentesis yesterday Pulmonary follow-up  decompensated liver disease is the cause  echocardiogram : 60 to 65% with normal systolic function Hold diuretics as blood pressure is softer CT abdomen without contrast to assess for lymphoma 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 Management plans discussed with the patient, family and they are in agreement.  CODE STATUS: Full code  DVT Prophylaxis: SCDs  TOTAL TIME TAKING CARE OF THIS PATIENT: 37 minutes.   POSSIBLE D/C IN 2-3 DAYS, DEPENDING ON CLINICAL CONDITION.  Saundra Shelling M.D on 03/20/2018 at 11:59 AM  Between 7am to 6pm - Pager - [phone]  After 6pm go to [email or web address] - password [email or web address] [facility]  Office  [phone]  CC: Primary care physician; Marnee Guarneri, MD  Note: This dictation was prepared with Dragon dictation along with smaller phrase technology. Any transcriptional errors that result from this process are unintentional.

## 2018-03-20 NOTE — Progress Notes (Signed)
Kelly Mclean , MD [address] [place], [place], [place], [address] [address], [place], [place], [place], [address] Phone: [phone]  Fax: [phone]   Kelly Mclean is being followed for ascites    Subjective: No new complaints    Objective: Vital signs in last 24 hours: Vitals:   03/20/18 0433 03/20/18 0659 03/20/18 0812 03/20/18 1008  BP: (!) 95/43 (!) 109/49 (!) 95/40 (!) 127/55  Pulse: 64 67 60 70  Resp: 18  12   Temp: 98.6 F (37 C)     TempSrc: Oral     SpO2: 100%  96%   Weight: 70 kg     Height:       Weight change: -1.124 kg  Intake/Output Summary (Last 24 hours) at 03/20/2018 1056 Last data filed at 03/19/2018 2241 Gross per 24 hour  Intake -  Output 150 ml  Net -150 ml     Exam: Heart:: Regular rate and rhythm, S1S2 present or without murmur or extra heart sounds Lungs: normal, clear to auscultation and clear to auscultation and percussion Abdomen: soft, nontender, normal bowel sounds   Lab Results: @LABTEST2 @ Micro Results: Recent Results (from the past 240 hour(s))  Body fluid culture     Status: None   Collection Time: 03/14/18  3:50 PM  Result Value Ref Range Status   Specimen Description   Final    PLEURAL Performed at [facility], [address], [place], [place] [address]    Special Requests   Final    NONE Performed at [facility], [address] [place], [place] [address]    Gram Stain NO WBC SEEN NO ORGANISMS SEEN   Final   Culture   Final    NO GROWTH 3 DAYS Performed at [facility], [place] [address], [place], [place] [address]    Report Status 03/18/2018 FINAL  Final  Body fluid culture     Status: None (Preliminary result)   Collection Time: 03/19/18  3:50 PM  Result Value Ref Range Status   Specimen Description   Final    PLEURAL Performed at [facility], [address] [place], [place] [address]    Special Requests   Final    NONE Performed at [facility], [address] [place], [place] [address]    Gram Stain   Final    FEW WBC PRESENT, PREDOMINANTLY MONONUCLEAR NO ORGANISMS SEEN    Culture   Final    NO GROWTH < 24 HOURS Performed at [facility], [place] [address], [place], [place] [address]    Report Status PENDING  Incomplete  Body fluid culture     Status: None (Preliminary result)   Collection Time: 03/19/18  4:00 PM  Result Value Ref Range Status   Specimen Description   Final    PERITONEAL Performed at [facility], [address] [place], [place] [address]    Special Requests   Final    NONE Performed at [facility], [address] [place], [place] [address]    Gram Stain   Final    RARE WBC PRESENT, PREDOMINANTLY MONONUCLEAR NO ORGANISMS SEEN    Culture   Final    NO GROWTH < 24 HOURS Performed at [facility], [place] [address] [place],  [address]    Report Status PENDING  Incomplete   Studies/Results: [place] Intraoperative  Result Date: 03/19/2018 CLINICAL DATA:  Ultrasound was provided for use  by the ordering physician, and a technical charge was applied by the performing facility.  No radiologist interpretation/professional services rendered.   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 Patient is being evaluated for a hepatic hydrothorax. EXAM: NUCLEAR MEDICINE SULFUR COLLOID PERITONEAL SCINTIGRAPHY TECHNIQUE: Ultrasound-guided left thoracentesis was performed. 1.1 L of left pleural fluid was removed but not all of the fluid was removed. Subsequently, an ultrasound-guided paracentesis was performed in the right upper quadrant. 60 mL of peritoneal fluid was removed. Technetium 99 M sulfur colloid was injected through the paracentesis catheter. Paracentesis catheter was removed. RADIOPHARMACEUTICALS:  7 millicuries technetium 99 M sulfur colloid COMPARISON:  Chest CT 03/14/2018  FINDINGS: Imaging was obtained over 1 hour. The initial images demonstrated radiopharmaceutical throughout the peritoneal cavity. The largest peritoneal pockets are in the right upper quadrant and left lower quadrant. Over time, radiopharmaceutical starts to accumulate in the left hemithorax. Findings are compatible with a trans diaphragmatic connection between the peritoneal cavity and the left pleural space. IMPRESSION: Study is positive for connection between the peritoneal space and left pleural space. 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 Electronically Signed   By: Markus Daft M.D.   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 Plan for nuclear medicine ascites - hydrothorax examination. Plan for an ultrasound-guided paracentesis and injection of radiopharmaceutical into the ascites. Left thoracentesis was performed prior to this procedure. EXAM: ULTRASOUND GUIDED PARACENTESIS INJECTION OF RADIOPHARMACEUTICAL INTO ASCITES MEDICATIONS: None. COMPLICATIONS: None immediate. PROCEDURE: Informed written consent was obtained from the patient after a discussion of the risks, benefits and alternatives to treatment. A timeout was performed prior to the initiation of the procedure. Initial ultrasound scanning demonstrates a small amount of ascites within the right upper abdominal quadrant. The right upper abdomen was prepped and draped in the usual sterile fashion. 1% lidocaine with was used for local anesthesia. Following this, a 6 Fr Safe-T-Centesis catheter was introduced. An ultrasound image was saved for documentation purposes. The paracentesis was performed. 60 mL of opaque yellow fluid was removed. The radiopharmaceutical was injected through the Safe-T-Centesis catheter. Catheter was flushed with sterile saline. The catheter was removed and a dressing was applied. The patient tolerated the  procedure well without immediate post procedural complication. FINDINGS: Small amount of fluid around the liver. Small amount of fluid in the left lower quadrant. 60 mL of fluid was removed from the right upper quadrant. The radiopharmaceutical was injected into the right upper quadrant. IMPRESSION: Successful ultrasound-guided paracentesis yielding 60 mL of ascites. Fluid was sent for labs. Radiopharmaceutical was injected through the paracentesis catheter for the nuclear medicine examination. Electronically Signed   By: Markus Daft M.D.   On: 03/19/2018 17:06   Dg Chest Port 1 View  Result Date: 03/19/2018 CLINICAL DATA:  Status post thoracentesis. EXAM: PORTABLE CHEST 1 VIEW COMPARISON:  03/14/2018 FINDINGS: 1702 hours. Moderate to large left pleural effusion noted without evidence for pneumothorax. Left base collapse/consolidation. The cardio pericardial silhouette is enlarged. The visualized bony structures of the thorax are intact. Telemetry leads overlie the chest. IMPRESSION: 1. No evidence for pneumothorax status post thoracentesis. 2. Moderate to large left pleural effusion. Electronically Signed   By: Misty Stanley M.D.   On: 03/19/2018 18:56   US Thoracentesis Asp Pleural Space W/img Guide  Result Date: 03/19/2018 INDICATION: 63 year old with cirrhosis and recurrent  left hydrothorax. Patient is being evaluated for a connection between the peritoneal and left pleural space with a nuclear medicine examination. Patient has a large amount of left pleural fluid on ultrasound examination. Plan to remove some of the left pleural fluid prior to the nuclear medicine examination. EXAM: ULTRASOUND GUIDED LEFT THORACENTESIS MEDICATIONS: None. COMPLICATIONS: None immediate. PROCEDURE: An ultrasound guided thoracentesis was thoroughly discussed with the patient and questions answered. The benefits, risks, alternatives and complications were also discussed. The patient understands and wishes to proceed with the  procedure. Written consent was obtained. Ultrasound was performed to localize and mark an adequate pocket of fluid in the left chest. The area was then prepped and draped in the normal sterile fashion. 1% Lidocaine was used for local anesthesia. Under ultrasound guidance a 6 Fr Safe-T-Centesis catheter was introduced. Thoracentesis was performed. The catheter was removed and a dressing applied. FINDINGS: A total of approximately 1.1 L of opaque yellow fluid was removed. Samples were sent to the laboratory as requested by the clinical team. IMPRESSION: Successful ultrasound guided left thoracentesis yielding 1.1 L of pleural fluid. Electronically Signed   By: Markus Daft M.D.   On: 03/19/2018 17:11   Medications:  Scheduled Meds:I have reviewed the patient's current medications. Marland Kitchen atenolol  12.5 mg Oral Daily  . busPIRone  10 mg Oral TID  . citalopram  20 mg Oral Daily  . docusate  100 mg Oral BID  . famotidine  20 mg Oral QHS  . feeding supplement  1 Container Oral TID BM  . furosemide  20 mg Oral Daily  . gabapentin  200 mg Oral QHS  . hydrocerin   Topical BID  . lisinopril  2.5 mg Oral QHS  . montelukast  10 mg Oral QHS  . primidone  50 mg Oral BID  . sodium chloride flush  3 mL Intravenous Q12H  . spironolactone  50 mg Oral Daily   Continuous Infusions: PRN Meds:.acetaminophen **OR** acetaminophen, albuterol, HYDROcodone-acetaminophen, ondansetron **OR** ondansetron (ZOFRAN) IV, polyethylene glycol, sodium chloride, sodium chloride flush, trolamine salicylate   Assessment: Active Problems:   Acute respiratory failure ([place])  Cato Mulligan 63 y.o. female history of recurrent pleural effusions and liver cirrhosis.  Presented to the emergency room last week with shortness of breath and recurrence of pleural effusion.  She has a history of alcohol abuse.  Cytology of the fluid is negative for malignancy.  CT scan of the abdomen and pelvis on 03/06/2018 showed features of cirrhosis, portal  hypertension.  Large left pleural effusion. Evaluation of ascites fluid demonstrated  on 03/14/2018 the ascetic fluid had a albumin level of 1.4 and a serum albumin checked the next day was 2.4 and hence the SAAG would be 1.0 which suggests based on this calculation that the etiology of the ascites is not portal hypertension but clinically it is very suspicious of the same, this value could be skewed if the patient has received albumin around this time.  A shunt study performed on 03/19/2018 demonstrated the ascites is tracking into the pleural space and causing the pleural effusion.  When the procedure was performed the pleural fluid was reported as turbid and pink in color with a lymphocyte dominant pleural as well as peritoneal cell count.  So far the cultures been negative the albumin is less than 1 suggesting a SAG greater than 1.1 and hence with reasonable certainty can be said that the ascites is due to portal hypertension.  The smear does not show any abnormal cells  but shows mainly small lymphocytes and rare profile just.  Glycerides are still awaited. TB QuantiFERON is negative.  Plan: 1.  The shunt study  suggest the patient has a  hepatic hydrothorax.  Generally the treatment for this would be a TIPS procedure.  The issue that I am concerned about is the lymphocyte count in the pleural as well as the peritoneal fluid.  It is abnormal to have such a high lymphocyte count.  I do note that the lactate dehydrogenase is elevated at 60 and 95 and this can be seen in malignancy in addition to many other causes .  A lymphocyte predominant ascites can also be seen in tuberculosis as well as malignancy.  In terms of this patient the next that I would suggest would be a CT scan of the abdomen.  If there is no evidence of malignancy on the CT scan I would like to consult oncology to determine if we could perform flow cytometry and interpret the results to determine if there is any evidence of malignancy.  If  that were to be negative we would also need to consult ID to see if there is any  Other way to r/o TB as an etiology.  I would be a bit reluctant to perform a TIPS procedure in her until we sort out this issue with lymphocytosis in her pleural and peritoneal fluid.  I would also appreciate if pulmonary can weigh in into the etiology of the lymphocytes in her pleural fluid and if all causes have been ruled out including tuberculosis.  2.  In the meanwhile suggest a low-sodium diet with sodium of less than 2 g/day.  3.  Continue Lasix and Aldactone.    4.  Check TSH  I have discussed this plan with Dr. Estanislado Pandy   LOS: 6 days   Kelly Bellows, MD 03/20/2018, 10:56 AM

## 2018-03-21 LAB — CBC
HCT: 28.2 % — ABNORMAL LOW (ref 36.0–46.0)
Hemoglobin: 9.2 g/dL — ABNORMAL LOW (ref 12.0–15.0)
MCH: 33.7 pg (ref 26.0–34.0)
MCHC: 32.6 g/dL (ref 30.0–36.0)
MCV: 103.3 fL — AB (ref 80.0–100.0)
PLATELETS: 96 10*3/uL — AB (ref 150–400)
RBC: 2.73 MIL/uL — ABNORMAL LOW (ref 3.87–5.11)
RDW: 15.1 % (ref 11.5–15.5)
WBC: 5.4 10*3/uL (ref 4.0–10.5)
nRBC: 0 % (ref 0.0–0.2)

## 2018-03-21 LAB — BASIC METABOLIC PANEL
Anion gap: 3 — ABNORMAL LOW (ref 5–15)
BUN: 40 mg/dL — ABNORMAL HIGH (ref 8–23)
CO2: 25 mmol/L (ref 22–32)
Calcium: 8.4 mg/dL — ABNORMAL LOW (ref 8.9–10.3)
Chloride: 104 mmol/L (ref 98–111)
Creatinine, Ser: 2.31 mg/dL — ABNORMAL HIGH (ref 0.44–1.00)
GFR calc Af Amer: 26 mL/min — ABNORMAL LOW (ref >60)
GFR calc non Af Amer: 22 mL/min — ABNORMAL LOW (ref >60)
Glucose, Bld: 95 mg/dL (ref 70–99)
Potassium: 4.9 mmol/L (ref 3.5–5.1)
Sodium: 132 mmol/L — ABNORMAL LOW (ref 135–145)

## 2018-03-21 NOTE — Care Management (Signed)
This patient is a [facility] patient.  All home health needs will be set up by [facility].

## 2018-03-21 NOTE — Plan of Care (Signed)
  Problem: Education: Goal: Knowledge of General Education information will improve Description: Including pain rating scale, medication(s)/side effects and non-pharmacologic comfort measures Outcome: Progressing   Problem: Activity: Goal: Risk for activity intolerance will decrease Outcome: Progressing   

## 2018-03-21 NOTE — Progress Notes (Signed)
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   Subjective: Feels ok    Objective: Vital signs in last 24 hours: Vitals:   03/21/18 0458 03/21/18 0517 03/21/18 0600 03/21/18 1027  BP: (!) 89/51 (!) 92/50 (!) 121/54 (!) 115/52  Pulse: 79 75 75 88  Resp: 18   18  Temp: 98 F (36.7 C)   98.1 F (36.7 C)  TempSrc: Oral   Oral  SpO2: 99%   98%  Weight: 70.4 kg     Height:       Weight change: 0.353 kg  Intake/Output Summary (Last 24 hours) at 03/21/2018 1130 Last data filed at 03/21/2018 0900 Gross per 24 hour  Intake -  Output 401 ml  Net -401 ml     Exam: Heart:: Regular rate and rhythm Lungs: decreased air entry left chest  Abdomen: soft, nontender, normal bowel sounds   Lab Results: @LABTEST2 @ Micro Results: Recent Results (from the past 240 hour(s))  Body fluid culture     Status: None   Collection Time: 03/14/18  3:50 PM  Result Value Ref Range Status   Specimen Description   Final    PLEURAL Performed at [facility], [address] [place], [place] [address]    Special Requests   Final    NONE Performed at [facility], [address], [place], [place] [address]    Gram Stain NO WBC SEEN NO ORGANISMS SEEN   Final   Culture   Final    NO GROWTH 3 DAYS Performed at [facility], [place] [address] [place], [place] [address]    Report Status 03/18/2018 FINAL  Final  Body fluid culture     Status: None (Preliminary result)   Collection Time: 03/19/18  3:50 PM  Result Value Ref Range Status   Specimen Description   Final    PLEURAL Performed at [facility], [address] [place], [place] [address]    Special Requests   Final    NONE Performed at [facility], [address] [place], [place] [address]    Gram Stain    Final    FEW WBC PRESENT, PREDOMINANTLY MONONUCLEAR NO ORGANISMS SEEN Performed at [facility], [place] [address], [place], [place] [address]    Culture NO GROWTH 2 DAYS  Final   Report Status PENDING  Incomplete  Body fluid culture     Status: None (Preliminary result)   Collection Time: 03/19/18  4:00 PM  Result Value Ref Range Status   Specimen Description   Final    PERITONEAL Performed at [facility], [address], [place], [place] [address]    Special Requests   Final    NONE Performed at [facility], [address] [place], [place] [address]    Gram Stain   Final    RARE WBC PRESENT, PREDOMINANTLY MONONUCLEAR NO ORGANISMS SEEN Performed at [facility], [place] [address] [place], [place] [address]    Culture NO GROWTH 2 DAYS  Final   Report Status PENDING  Incomplete   Studies/Results: Ct Abdomen Pelvis Wo Contrast  Result Date: 03/20/2018 CLINICAL DATA:  History of cirrhosis of liver, dyspnea. Hepatic hydrothorax. Fluid with lymphocytes requiring rule out lymphoma. EXAM: CT ABDOMEN AND PELVIS WITHOUT CONTRAST TECHNIQUE: Multidetector CT  imaging of the abdomen and pelvis was performed following the standard protocol without IV contrast. COMPARISON:  CT abdomen dated 03/06/2018 FINDINGS: Lower chest: Again noted is a large LEFT pleural effusion, incompletely imaged. Lingular consolidation is likely associated atelectasis. Hepatobiliary: Cirrhotic appearing liver. Gallstones are present within the nondistended gallbladder. No bile duct dilatation seen. Pancreas: Unremarkable. No pancreatic ductal dilatation or surrounding inflammatory changes. Spleen: Normal in size without focal abnormality. Adrenals/Urinary Tract: Kidneys are unremarkable without mass, stone or hydronephrosis. No perinephric fluid. No ureteral or bladder calculi identified. Bladder is obscured by overlying ascites. Stomach/Bowel: No dilated large or small bowel loops. 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 Appendix is normal. Stomach is unremarkable. Vascular/Lymphatic: Aortic atherosclerosis. Enlarged lymph node adjacent to the pancreatic head, 1.6 cm, better seen on the recent contrast-enhanced CT of 03/06/2018. No other enlarged lymph nodes appreciated in the abdomen or pelvis. Reproductive: Uterus appears normal. Ascites obscures visualization of the bilateral adnexa. Other: Moderate amount of ascites within the abdomen and pelvis, largest component within the pelvis. No evidence of circumscribed fluid collection or abscess. No free intraperitoneal air. Musculoskeletal: No acute or suspicious osseous finding. Superficial soft tissues are unremarkable. IMPRESSION: 1. Cirrhotic liver. 2. Moderate amount of ascites within the abdomen and pelvis, largest component within the pelvis. 3. Enlarged lymph node adjacent to the pancreatic head, measuring 1.6 cm, better seen on the recent contrast-enhanced CT of 03/06/2016, favor reactive over neoplastic etiology given the absence of additional lymphadenopathy. 4. Large LEFT pleural effusion, incompletely imaged. 5. Cholelithiasis without evidence of acute cholecystitis. Aortic Atherosclerosis (ICD10-I70.0). Electronically Signed   By: Franki Cabot M.D.   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 Plan for nuclear medicine ascites - hydrothorax examination. Plan for an ultrasound-guided paracentesis and injection of radiopharmaceutical into the ascites. Left thoracentesis was performed prior to this procedure. EXAM: ULTRASOUND GUIDED PARACENTESIS INJECTION OF RADIOPHARMACEUTICAL INTO ASCITES MEDICATIONS: None. COMPLICATIONS: None immediate. PROCEDURE: Informed written consent was obtained from the patient after a discussion of the risks, benefits and alternatives to treatment. A timeout was performed prior to the initiation of the procedure.  Initial ultrasound scanning demonstrates a small amount of ascites within the right upper abdominal quadrant. The right upper abdomen was prepped and draped in the usual sterile fashion. 1% lidocaine with was used for local anesthesia. Following this, a 6 Fr Safe-T-Centesis catheter was introduced. An ultrasound image was saved for documentation purposes. The paracentesis was performed. 60 mL of opaque yellow fluid was removed. The radiopharmaceutical was injected through the Safe-T-Centesis catheter. Catheter was flushed with sterile saline. The catheter was removed and a dressing was applied. The patient tolerated the procedure well without immediate post procedural complication. FINDINGS: Small amount of fluid around the liver. Small amount of fluid in the left lower quadrant. 60 mL of fluid was removed from the right upper quadrant. The radiopharmaceutical was injected into the right upper quadrant. IMPRESSION: Successful ultrasound-guided paracentesis yielding 60 mL of ascites. Fluid was sent for labs. Radiopharmaceutical was injected through the paracentesis catheter for the nuclear medicine examination. Electronically Signed   By: Markus Daft M.D.   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 Patient is being evaluated for a hepatic hydrothorax. EXAM: NUCLEAR MEDICINE SULFUR COLLOID PERITONEAL SCINTIGRAPHY TECHNIQUE: Ultrasound-guided left thoracentesis was performed. 1.1 L of left pleural fluid was  removed but not all of the fluid was removed. Subsequently, an ultrasound-guided paracentesis was performed in the right upper quadrant. 60 mL of peritoneal fluid was removed. Technetium 99 M sulfur colloid was injected through the paracentesis catheter. Paracentesis catheter was removed. RADIOPHARMACEUTICALS:  7 millicuries technetium 99 M sulfur colloid COMPARISON:  Chest CT 03/14/2018  FINDINGS: Imaging was obtained over 1 hour. The initial images demonstrated radiopharmaceutical throughout the peritoneal cavity. The largest peritoneal pockets are in the right upper quadrant and left lower quadrant. Over time, radiopharmaceutical starts to accumulate in the left hemithorax. Findings are compatible with a trans diaphragmatic connection between the peritoneal cavity and the left pleural space. IMPRESSION: Study is positive for connection between the peritoneal space and left pleural space. 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 Electronically Signed   By: Markus Daft M.D.   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 Plan for nuclear medicine ascites - hydrothorax examination. Plan for an ultrasound-guided paracentesis and injection of radiopharmaceutical into the ascites. Left thoracentesis was performed prior to this procedure. EXAM: ULTRASOUND GUIDED PARACENTESIS INJECTION OF RADIOPHARMACEUTICAL INTO ASCITES MEDICATIONS: None. COMPLICATIONS: None immediate. PROCEDURE: Informed written consent was obtained from the patient after a discussion of the risks, benefits and alternatives to treatment. A timeout was performed prior to the initiation of the procedure. Initial ultrasound scanning demonstrates a small amount of ascites within the right upper abdominal quadrant. The right upper abdomen was prepped and draped in the usual sterile fashion. 1% lidocaine with was used for local anesthesia. Following this, a 6 Fr Safe-T-Centesis catheter was introduced. An ultrasound image was saved for documentation purposes. The paracentesis was performed. 60 mL of opaque yellow fluid was removed. The radiopharmaceutical was injected through the Safe-T-Centesis catheter. Catheter was flushed with sterile saline. The catheter was removed and a dressing was applied. The patient tolerated the  procedure well without immediate post procedural complication. FINDINGS: Small amount of fluid around the liver. Small amount of fluid in the left lower quadrant. 60 mL of fluid was removed from the right upper quadrant. The radiopharmaceutical was injected into the right upper quadrant. IMPRESSION: Successful ultrasound-guided paracentesis yielding 60 mL of ascites. Fluid was sent for labs. Radiopharmaceutical was injected through the paracentesis catheter for the nuclear medicine examination. Electronically Signed   By: Markus Daft M.D.   On: 03/19/2018 17:06   Dg Chest Port 1 View  Result Date: 03/19/2018 CLINICAL DATA:  Status post thoracentesis. EXAM: PORTABLE CHEST 1 VIEW COMPARISON:  03/14/2018 FINDINGS: 1702 hours. Moderate to large left pleural effusion noted without evidence for pneumothorax. Left base collapse/consolidation. The cardio pericardial silhouette is enlarged. The visualized bony structures of the thorax are intact. Telemetry leads overlie the chest. IMPRESSION: 1. No evidence for pneumothorax status post thoracentesis. 2. Moderate to large left pleural effusion. Electronically Signed   By: Misty Stanley M.D.   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 Patient is being evaluated for a connection between the peritoneal and left pleural space with a nuclear medicine examination. Patient has a large amount of left pleural fluid on ultrasound examination. Plan to remove some of the left pleural fluid prior to the nuclear medicine examination. EXAM: ULTRASOUND GUIDED LEFT THORACENTESIS MEDICATIONS: None. COMPLICATIONS: None immediate. PROCEDURE: An ultrasound guided thoracentesis was thoroughly discussed with the patient and  questions answered. The benefits, risks, alternatives and complications were also discussed. The patient understands and wishes to proceed with the  procedure. Written consent was obtained. Ultrasound was performed to localize and mark an adequate pocket of fluid in the left chest. The area was then prepped and draped in the normal sterile fashion. 1% Lidocaine was used for local anesthesia. Under ultrasound guidance a 6 Fr Safe-T-Centesis catheter was introduced. Thoracentesis was performed. The catheter was removed and a dressing applied. FINDINGS: A total of approximately 1.1 L of opaque yellow fluid was removed. Samples were sent to the laboratory as requested by the clinical team. IMPRESSION: Successful ultrasound guided left thoracentesis yielding 1.1 L of pleural fluid. Electronically Signed   By: Markus Daft M.D.   On: 03/19/2018 17:11   Medications: I have reviewed the patient's current medications. Scheduled Meds: . atenolol  12.5 mg Oral Daily  . busPIRone  10 mg Oral TID  . citalopram  20 mg Oral Daily  . docusate  100 mg Oral BID  . famotidine  20 mg Oral QHS  . feeding supplement  1 Container Oral TID BM  . furosemide  20 mg Oral Daily  . gabapentin  200 mg Oral QHS  . hydrocerin   Topical BID  . lisinopril  2.5 mg Oral QHS  . montelukast  10 mg Oral QHS  . multivitamin with minerals  1 tablet Oral Daily  . primidone  50 mg Oral BID  . sodium chloride flush  3 mL Intravenous Q12H  . spironolactone  25 mg Oral Daily   Continuous Infusions: PRN Meds:.acetaminophen **OR** acetaminophen, albuterol, HYDROcodone-acetaminophen, ondansetron **OR** ondansetron (ZOFRAN) IV, polyethylene glycol, sodium chloride, sodium chloride flush, trolamine salicylate   Assessment: Active Problems:   Acute respiratory failure (HCC)  A shunt study performed on 03/19/2018 demonstrated the ascites is tracking into the pleural space and causing the pleural effusion.  When the procedure was performed the pleural fluid was reported as turbid and pink in color with a lymphocyte dominant pleural as well as peritoneal cell count.  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  The smear does not show any abnormal cells but shows mainly small lymphocytes and rare profile just.  Glycerides are still awaited. TB QuantiFERON is negative.CT abdomen does not show any masses   Plan: 1.  The shunt study  suggest the patient has a  hepatic hydrothorax.  Generally the treatment for this would be a TIPS procedure.  The issue that I am concerned about is the lymphocyte count in the pleural as well as the peritoneal fluid.  It is abnormal to have such a high lymphocyte count.  I do note that the lactate dehydrogenase is elevated at 60 and 95 and this can be seen in malignancy in addition to many other causes .  A lymphocyte predominant ascites can also be seen in tuberculosis.    I would like to consult oncology to determine if we could perform flow cytometry and interpret the results to determine if there is any evidence of lymphoma .  If that were to be negative we would also need to consult ID to see if there is any other way to r/o TB as an etiology.TSH normal .I would also appreciate if pulmonary can weigh in into the etiology of the lymphocytes in her pleural fluid  and if all causes have been ruled out including tuberculosis.  I would be a bit reluctant to perform a TIPS procedure in her until we sort out this issue with lymphocytosis in her pleural and peritoneal fluid.    2. In the meanwhile suggest a low-sodium diet with sodium of less than 2 g/day.  3. Continue Lasix and Aldactone.       LOS: 7 days   Kelly Bellows, MD 03/21/2018, 11:30 AM

## 2018-03-21 NOTE — Progress Notes (Signed)
[place] [facility] [place] NAME: Kelly Mclean    MR#:  [id number]  DATE OF BIRTH:  05-30-1955  SUBJECTIVE:  CHIEF COMPLAINT:   Chief Complaint  Patient presents with  . 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 No fatigue, weakness. No weight gain, no weight loss.  EYES: No blurry or double vision.  ENT: No tinnitus. No postnasal drip. No redness of the oropharynx.  RESPIRATORY: Mild cough, no wheeze, no hemoptysis. mild dyspnea.  CARDIOVASCULAR: No chest pain. No orthopnea. No palpitations. No syncope.  GASTROINTESTINAL: No nausea, no vomiting or diarrhea. No abdominal pain. No melena or hematochezia.  GENITOURINARY: No dysuria or hematuria.  ENDOCRINE: No polyuria or nocturia. No heat or cold intolerance.  HEMATOLOGY: No anemia. No bruising. No bleeding.  INTEGUMENTARY: No rashes. No lesions.  MUSCULOSKELETAL: No arthritis. No swelling. No gout.  NEUROLOGIC: No numbness, tingling, or ataxia. No seizure-type activity.  PSYCHIATRIC: No anxiety. No insomnia. No ADD.   DRUG ALLERGIES:   Allergies  Allergen Reactions  . Biaxin [Clarithromycin] Shortness Of Breath and Rash  . Penicillins Anaphylaxis and Other (See Comments)    Did it involve swelling of the face/tongue/throat, SOB, or low BP? Unknown Did it involve sudden or severe rash/hives, skin peeling, or any reaction on the inside of your mouth or nose? Unknown Did you need to seek medical attention at a hospital or doctor's office? Yes When did it last happen?childhood If all above answers are "NO", may proceed with cephalosporin use.    Marland Kitchen Zoloft [Sertraline Hcl] Other (See Comments)    Reaction: "made crazy"  . Milk-Related  Compounds Diarrhea  . Lactose Intolerance (Gi)     VITALS:  Blood pressure (!) 115/52, pulse 88, temperature 98.1 F (36.7 C), temperature source Oral, resp. rate 18, height 5' 2"  (1.575 m), weight 70.4 kg, SpO2 98 %.  PHYSICAL EXAMINATION:   Physical Exam  GENERAL:  63 y.o.-year-old patient lying in the bed with no acute distress.  EYES: Pupils equal, round, reactive to light and accommodation. No scleral icterus. Extraocular muscles intact.  HEENT: Head atraumatic, normocephalic. Oropharynx and nasopharynx clear.  NECK:  Supple, no jugular venous distention. No thyroid enlargement, no tenderness.  LUNGS: Improved breath sounds bilaterally, has bibasilar crepitations. No use of accessory muscles of respiration.  CARDIOVASCULAR: S1, S2 normal. No murmurs, rubs, or gallops.  ABDOMEN: Soft, nontender, nondistended. Bowel sounds present. No organomegaly or mass.  EXTREMITIES: No cyanosis, clubbing or edema b/l.    NEUROLOGIC: Cranial nerves II through XII are intact. No focal Motor or sensory deficits b/l.   PSYCHIATRIC: The patient is alert and oriented x 3.  SKIN: No obvious rash, lesion, or ulcer.   LABORATORY PANEL:   CBC Recent Labs  Lab 03/21/18 0507  WBC 5.4  HGB 9.2*  HCT 28.2*  PLT 96*   ------------------------------------------------------------------------------------------------------------------ Chemistries  Recent Labs  Lab 03/17/18 0502  03/21/18 0507  NA 129*   < > 132*  K 4.2   < > 4.9  CL 99   < > 104  CO2 23   < > 25  GLUCOSE 86   < > 95  BUN 23   < > 40*  CREATININE 1.67*   < >  2.31*  CALCIUM 8.3*   < > 8.4*  AST 33  --   --   ALT 22  --   --   ALKPHOS 67  --   --   BILITOT 2.0*  --   --    < > = values in this interval not displayed.   ------------------------------------------------------------------------------------------------------------------  Cardiac Enzymes No results for input(s): TROPONINI in the last 168  hours. ------------------------------------------------------------------------------------------------------------------  RADIOLOGY:  Ct Abdomen Pelvis Wo Contrast  Result Date: 03/20/2018 CLINICAL DATA:  History of cirrhosis of liver, dyspnea. Hepatic hydrothorax. Fluid with lymphocytes requiring rule out lymphoma. EXAM: CT ABDOMEN AND PELVIS WITHOUT CONTRAST TECHNIQUE: Multidetector CT imaging of the abdomen and pelvis was performed following the standard protocol without IV contrast. COMPARISON:  CT abdomen dated 03/06/2018 FINDINGS: Lower chest: Again noted is a large LEFT pleural effusion, incompletely imaged. Lingular consolidation is likely associated atelectasis. Hepatobiliary: Cirrhotic appearing liver. Gallstones are present within the nondistended gallbladder. No bile duct dilatation seen. Pancreas: Unremarkable. No pancreatic ductal dilatation or surrounding inflammatory changes. Spleen: Normal in size without focal abnormality. Adrenals/Urinary Tract: Kidneys are unremarkable without mass, stone or hydronephrosis. No perinephric fluid. No ureteral or bladder calculi identified. Bladder is obscured by overlying ascites. Stomach/Bowel: No dilated large or small bowel loops. 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 Appendix is normal. Stomach is unremarkable. Vascular/Lymphatic: Aortic atherosclerosis. Enlarged lymph node adjacent to the pancreatic head, 1.6 cm, better seen on the recent contrast-enhanced CT of 03/06/2018. No other enlarged lymph nodes appreciated in the abdomen or pelvis. Reproductive: Uterus appears normal. Ascites obscures visualization of the bilateral adnexa. Other: Moderate amount of ascites within the abdomen and pelvis, largest component within the pelvis. No evidence of circumscribed fluid collection or abscess. No free intraperitoneal air. Musculoskeletal: No acute or suspicious osseous finding. Superficial soft tissues are unremarkable. IMPRESSION: 1. Cirrhotic liver.  2. Moderate amount of ascites within the abdomen and pelvis, largest component within the pelvis. 3. Enlarged lymph node adjacent to the pancreatic head, measuring 1.6 cm, better seen on the recent contrast-enhanced CT of 03/06/2016, favor reactive over neoplastic etiology given the absence of additional lymphadenopathy. 4. Large LEFT pleural effusion, incompletely imaged. 5. Cholelithiasis without evidence of acute cholecystitis. Aortic Atherosclerosis (ICD10-I70.0). Electronically Signed   By: Franki Cabot M.D.   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 Plan for nuclear medicine ascites - hydrothorax examination. Plan for an ultrasound-guided paracentesis and injection of radiopharmaceutical into the ascites. Left thoracentesis was performed prior to this procedure. EXAM: ULTRASOUND GUIDED PARACENTESIS INJECTION OF RADIOPHARMACEUTICAL INTO ASCITES MEDICATIONS: None. COMPLICATIONS: None immediate. PROCEDURE: Informed written consent was obtained from the patient after a discussion of the risks, benefits and alternatives to treatment. A timeout was performed prior to the initiation of the procedure. Initial ultrasound scanning demonstrates a small amount of ascites within the right upper abdominal quadrant. The right upper abdomen was prepped and draped in the usual sterile fashion. 1% lidocaine with was used for local anesthesia. Following this, a 6 Fr Safe-T-Centesis catheter was introduced. An ultrasound image was saved for documentation purposes. The paracentesis was performed. 60 mL of opaque yellow fluid was removed. The radiopharmaceutical was injected through the Safe-T-Centesis catheter. Catheter was flushed with sterile saline. The catheter was removed and a dressing was applied. The patient tolerated the procedure well without immediate post procedural complication. FINDINGS: Small amount of fluid around  the liver. Small amount of fluid in  the left lower quadrant. 60 mL of fluid was removed from the right upper quadrant. The radiopharmaceutical was injected into the right upper quadrant. IMPRESSION: Successful ultrasound-guided paracentesis yielding 60 mL of ascites. Fluid was sent for labs. Radiopharmaceutical was injected through the paracentesis catheter for the nuclear medicine examination. Electronically Signed   By: Markus Daft M.D.   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 Patient is being evaluated for a hepatic hydrothorax. EXAM: NUCLEAR MEDICINE SULFUR COLLOID PERITONEAL SCINTIGRAPHY TECHNIQUE: Ultrasound-guided left thoracentesis was performed. 1.1 L of left pleural fluid was removed but not all of the fluid was removed. Subsequently, an ultrasound-guided paracentesis was performed in the right upper quadrant. 60 mL of peritoneal fluid was removed. Technetium 99 M sulfur colloid was injected through the paracentesis catheter. Paracentesis catheter was removed. RADIOPHARMACEUTICALS:  7 millicuries technetium 99 M sulfur colloid COMPARISON:  Chest CT 03/14/2018 FINDINGS: Imaging was obtained over 1 hour. The initial images demonstrated radiopharmaceutical throughout the peritoneal cavity. The largest peritoneal pockets are in the right upper quadrant and left lower quadrant. Over time, radiopharmaceutical starts to accumulate in the left hemithorax. Findings are compatible with a trans diaphragmatic connection between the peritoneal cavity and the left pleural space. IMPRESSION: Study is positive for connection between the peritoneal space and left pleural space. 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 Electronically Signed   By: Markus Daft M.D.   On: 03/19/2018 17:24   US Paracentesis  Result Date: 03/19/2018 INDICATION: 63 year old with cirrhosis  and recurrent left hydrothorax. Plan for nuclear medicine ascites - hydrothorax examination. Plan for an ultrasound-guided paracentesis and injection of radiopharmaceutical into the ascites. Left thoracentesis was performed prior to this procedure. EXAM: ULTRASOUND GUIDED PARACENTESIS INJECTION OF RADIOPHARMACEUTICAL INTO ASCITES MEDICATIONS: None. COMPLICATIONS: None immediate. PROCEDURE: Informed written consent was obtained from the patient after a discussion of the risks, benefits and alternatives to treatment. A timeout was performed prior to the initiation of the procedure. Initial ultrasound scanning demonstrates a small amount of ascites within the right upper abdominal quadrant. The right upper abdomen was prepped and draped in the usual sterile fashion. 1% lidocaine with was used for local anesthesia. Following this, a 6 Fr Safe-T-Centesis catheter was introduced. An ultrasound image was saved for documentation purposes. The paracentesis was performed. 60 mL of opaque yellow fluid was removed. The radiopharmaceutical was injected through the Safe-T-Centesis catheter. Catheter was flushed with sterile saline. The catheter was removed and a dressing was applied. The patient tolerated the procedure well without immediate post procedural complication. FINDINGS: Small amount of fluid around the liver. Small amount of fluid in the left lower quadrant. 60 mL of fluid was removed from the right upper quadrant. The radiopharmaceutical was injected into the right upper quadrant. IMPRESSION: Successful ultrasound-guided paracentesis yielding 60 mL of ascites. Fluid was sent for labs. Radiopharmaceutical was injected through the paracentesis catheter for the nuclear medicine examination. Electronically Signed   By: Markus Daft M.D.   On: 03/19/2018 17:06   Dg Chest Port 1 View  Result Date: 03/19/2018 CLINICAL DATA:  Status post thoracentesis. EXAM: PORTABLE CHEST 1 VIEW COMPARISON:  03/14/2018 FINDINGS: 1702  hours. Moderate to large left pleural effusion noted without evidence for pneumothorax. Left base collapse/consolidation. The cardio pericardial silhouette is enlarged. The visualized bony structures of the thorax are intact. Telemetry leads overlie the chest. IMPRESSION: 1. No evidence for  pneumothorax status post thoracentesis. 2. Moderate to large left pleural effusion. Electronically Signed   By: Misty Stanley M.D.   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 Patient is being evaluated for a connection between the peritoneal and left pleural space with a nuclear medicine examination. Patient has a large amount of left pleural fluid on ultrasound examination. Plan to remove some of the left pleural fluid prior to the nuclear medicine examination. EXAM: ULTRASOUND GUIDED LEFT THORACENTESIS MEDICATIONS: None. COMPLICATIONS: None immediate. PROCEDURE: An ultrasound guided thoracentesis was thoroughly discussed with the patient and questions answered. The benefits, risks, alternatives and complications were also discussed. The patient understands and wishes to proceed with the procedure. Written consent was obtained. Ultrasound was performed to localize and mark an adequate pocket of fluid in the left chest. The area was then prepped and draped in the normal sterile fashion. 1% Lidocaine was used for local anesthesia. Under ultrasound guidance a 6 Fr Safe-T-Centesis catheter was introduced. Thoracentesis was performed. The catheter was removed and a dressing applied. FINDINGS: A total of approximately 1.1 L of opaque yellow fluid was removed. Samples were sent to the laboratory as requested by the clinical team. IMPRESSION: Successful ultrasound guided left thoracentesis yielding 1.1 L of pleural fluid. Electronically Signed   By: Markus Daft M.D.   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 Fluid has lot of lymphocytes need to rule out lymphoma Oncology evaluation Infectious disease consult to evaluate for tuberculosis Status post thoracentesis in this hospitalization twice Pulmonary follow-up appreciated  decompensated liver disease is the cause  echocardiogram : 60 to 65% with normal systolic function Diuretics on hold CT abdomen without contrast did not show any acute pathology except ascites, cholelithiasis, enlarged lymph nodes at the pancreatic head and cirrhosis of liver 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 Management plans discussed with the patient, family and they are in agreement.  CODE STATUS: Full code  DVT Prophylaxis: SCDs  TOTAL TIME TAKING CARE OF THIS PATIENT: 38 minutes.   POSSIBLE D/C IN 2-3 DAYS, DEPENDING ON CLINICAL CONDITION.  Saundra Shelling M.D on 03/21/2018 at 12:28 PM  Between 7am to 6pm - Pager - [phone]  After 6pm go to [email or web address] - password [email or web address] [facility]   Office  [phone]  CC: Primary care physician; Marnee Guarneri, MD  Note: This dictation was prepared with Dragon dictation along with smaller phrase technology. Any transcriptional errors that result from this process are unintentional.

## 2018-03-21 NOTE — Progress Notes (Addendum)
Pt BP was at 97/49 HR 73 and have a scheduled primidone (MYSOLINE) 50 mg tablet. Talked to Dr. Casimer Bilis and ordered to administered primidone and recheck BP after 1 hr of administration. Will continue to monitor.  Update 0040: Pt BP at 109/48 and HR 75.

## 2018-03-22 LAB — BASIC METABOLIC PANEL
Anion gap: 4 — ABNORMAL LOW (ref 5–15)
BUN: 42 mg/dL — ABNORMAL HIGH (ref 8–23)
CHLORIDE: 104 mmol/L (ref 98–111)
CO2: 23 mmol/L (ref 22–32)
Calcium: 8.5 mg/dL — ABNORMAL LOW (ref 8.9–10.3)
Creatinine, Ser: 2.3 mg/dL — ABNORMAL HIGH (ref 0.44–1.00)
GFR calc Af Amer: 26 mL/min — ABNORMAL LOW (ref >60)
GFR calc non Af Amer: 22 mL/min — ABNORMAL LOW (ref >60)
GLUCOSE: 88 mg/dL (ref 70–99)
Potassium: 5.1 mmol/L (ref 3.5–5.1)
Sodium: 131 mmol/L — ABNORMAL LOW (ref 135–145)

## 2018-03-22 LAB — BODY FLUID CULTURE: CULTURE: NO GROWTH

## 2018-03-22 NOTE — Progress Notes (Signed)
Sherri Sear, MD [address] [place], [place], [place], [address] 3940 [address] [place], [place], [place], [address] Phone: [phone]  Fax: [phone]   Kelly Mclean is being followed for hepatic hydrothorax   Subjective: Feels ok, no acute events overnight   Objective: Vital signs in last 24 hours: Vitals:   03/22/18 0707 03/22/18 0729 03/22/18 1633 03/22/18 2006  BP: (!) 111/45 (!) 96/53 (!) 100/58 (!) 120/41  Pulse: 72 73 72 79  Resp:  16 20   Temp:  98.7 F (37.1 C) 98.7 F (37.1 C) 97.9 F (36.6 C)  TempSrc:  Oral Oral Oral  SpO2:  98% 99% 100%  Weight:      Height:       Weight change: -0.353 kg  Intake/Output Summary (Last 24 hours) at 03/22/2018 2029 Last data filed at 03/22/2018 1847 Gross per 24 hour  Intake 360 ml  Output 800 ml  Net -440 ml     Exam: Heart:: Regular rate and rhythm Lungs: decreased air entry left chest  Abdomen: soft, nontender, normal bowel sounds   Lab Results: CBC Latest Ref Rng & Units 03/21/2018 03/15/2018 03/14/2018  WBC 4.0 - 10.5 K/uL 5.4 3.6(L) 4.3  Hemoglobin 12.0 - 15.0 g/dL 9.2(L) 8.7(L) 10.1(L)  Hematocrit 36.0 - 46.0 % 28.2(L) 25.9(L) 29.9(L)  Platelets 150 - 400 K/uL 96(L) 78(L) 105(L)   CMP Latest Ref Rng & Units 03/22/2018 03/21/2018 03/20/2018  Glucose 70 - 99 mg/dL 88 95 79  BUN 8 - 23 mg/dL 42(H) 40(H) 39(H)  Creatinine 0.44 - 1.00 mg/dL 2.30(H) 2.31(H) 2.57(H)  Sodium 135 - 145 mmol/L 131(L) 132(L) 132(L)  Potassium 3.5 - 5.1 mmol/L 5.1 4.9 5.0  Chloride 98 - 111 mmol/L 104 104 103  CO2 22 - 32 mmol/L 23 25 23   Calcium 8.9 - 10.3 mg/dL 8.5(L) 8.4(L) 8.5(L)  Total Protein 6.5 - 8.1 g/dL - - -  Total Bilirubin 0.3 - 1.2 mg/dL - - -  Alkaline Phos 38 - 126 U/L - - -  AST 15 - 41 U/L - - -  ALT 0 - 44 U/L - - -   Micro Results: Recent Results (from the past 240 hour(s))  Body fluid culture     Status: None   Collection Time: 03/14/18  3:50 PM  Result Value Ref Range Status   Specimen  Description   Final    PLEURAL Performed at [facility], [address] [place], [place] [address]    Special Requests   Final    NONE Performed at [facility], [address] [place], [place] [address]    Gram Stain NO WBC SEEN NO ORGANISMS SEEN   Final   Culture   Final    NO GROWTH 3 DAYS Performed at [facility], [place] [address] [place], [place] [address]    Report Status 03/18/2018 FINAL  Final  Body fluid culture     Status: None (Preliminary result)   Collection Time: 03/19/18  3:50 PM  Result Value Ref Range Status   Specimen Description   Final    PLEURAL Performed at [facility], [address] [place], [place] [address]    Special Requests   Final    NONE Performed at [facility], [place], [place] [address]    Gram Stain   Final    FEW WBC PRESENT, PREDOMINANTLY MONONUCLEAR NO ORGANISMS SEEN    Culture   Final  NO GROWTH 3 DAYS Performed at [facility], [place] [address], [place], [place] [address]    Report Status PENDING  Incomplete  Body fluid culture     Status: None   Collection Time: 03/19/18  4:00 PM  Result Value Ref Range Status   Specimen Description   Final    PERITONEAL Performed at [facility], [address], [place], [place] [address]    Special Requests   Final    NONE Performed at [facility], [address] [place], [place] [address]    Gram Stain   Final    RARE WBC PRESENT, PREDOMINANTLY MONONUCLEAR NO ORGANISMS SEEN    Culture   Final    NO GROWTH 3 DAYS Performed at [facility], [place] [address], [place], [place] [address]    Report Status 03/22/2018 FINAL  Final   Studies/Results: No results found. Medications: I have reviewed the patient's current medications. Scheduled Meds: . atenolol  12.5 mg Oral Daily  . busPIRone  10 mg Oral TID  . citalopram  20 mg Oral Daily  . docusate  100 mg Oral BID  . famotidine  20 mg  Oral QHS  . feeding supplement  1 Container Oral TID BM  . furosemide  20 mg Oral Daily  . gabapentin  200 mg Oral QHS  . hydrocerin   Topical BID  . lisinopril  2.5 mg Oral QHS  . montelukast  10 mg Oral QHS  . multivitamin with minerals  1 tablet Oral Daily  . primidone  50 mg Oral BID  . sodium chloride flush  3 mL Intravenous Q12H  . spironolactone  25 mg Oral Daily   Continuous Infusions: PRN Meds:.acetaminophen **OR** acetaminophen, albuterol, HYDROcodone-acetaminophen, ondansetron **OR** ondansetron (ZOFRAN) IV, polyethylene glycol, sodium chloride, sodium chloride flush, trolamine salicylate   Assessment: Active Problems:   Acute respiratory failure (HCC)  A shunt study performed on 03/19/2018 demonstrated the ascites is tracking into the pleural space and causing the pleural effusion.  When the procedure was performed the pleural fluid was reported as turbid and pink in color with a lymphocyte dominant pleural as well as peritoneal cell count.  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  The smear does not show any abnormal cells but shows mainly small lymphocytes and rare profile just.Triglycerides normal TB QuantiFERON is negative.CT abdomen does not show any masses   Plan: 1.  The shunt study  suggest the patient has a  hepatic hydrothorax.  Generally the treatment for this would be a TIPS procedure.  Given, the lymphocyte predominant pleural and peritoneal fluid, awaiting input from oncology, infectious disease specialist to rule out malignancy and other infectious etiology before proceeding with TIPS placement  2. In the meanwhile suggest a low-sodium diet with sodium of less than 2 g/day.  3. Continue Lasix and Aldactone.   Cephas Darby, MD [address]  [place]  [place], [place] [address]  Main: [phone]  Fax:  [phone] Pager: [phone]

## 2018-03-22 NOTE — Progress Notes (Signed)
Pt was transfer to 1C room 123 and called report to [facility]. Called Husband [name] and informed about the transfer.

## 2018-03-22 NOTE — Plan of Care (Signed)
  Problem: Health Behavior/Discharge Planning: Goal: Ability to manage health-related needs will improve Outcome: Progressing   Problem: Activity: Goal: Risk for activity intolerance will decrease Outcome: Progressing

## 2018-03-22 NOTE — Progress Notes (Signed)
[facility] [place] NAME: Kelly Mclean    MR#:  [id number]  DATE OF BIRTH:  [birth year]  SUBJECTIVE:  CHIEF COMPLAINT:   Chief Complaint  Patient presents with  . Shortness of Breath   patient has hepatic hydrothorax.  Has shortness of breath.  Denies any abdominal pain.  REVIEW OF SYSTEMS:    ROS  CONSTITUTIONAL: No documented fever. No fatigue, weakness. No weight gain, no weight loss.  EYES: No blurry or double vision.  ENT: No tinnitus. No postnasal drip. No redness of the oropharynx.  RESPIRATORY: Mild cough, no wheeze, no hemoptysis. mild dyspnea.  CARDIOVASCULAR: No chest pain. No orthopnea. No palpitations. No syncope.  GASTROINTESTINAL: No nausea, no vomiting or diarrhea. No abdominal pain. No melena or hematochezia.  GENITOURINARY: No dysuria or hematuria.  ENDOCRINE: No polyuria or nocturia. No heat or cold intolerance.  HEMATOLOGY: No anemia. No bruising. No bleeding.  INTEGUMENTARY: No rashes. No lesions.  MUSCULOSKELETAL: No arthritis. No swelling. No gout.  NEUROLOGIC: No numbness, tingling, or ataxia. No seizure-type activity.  PSYCHIATRIC: No anxiety. No insomnia. No ADD.   DRUG ALLERGIES:   Allergies  Allergen Reactions  . Biaxin [Clarithromycin] Shortness Of Breath and Rash  . Penicillins Anaphylaxis and Other (See Comments)    Did it involve swelling of the face/tongue/throat, SOB, or low BP? Unknown Did it involve sudden or severe rash/hives, skin peeling, or any reaction on the inside of your mouth or nose? Unknown Did you need to seek medical attention at a hospital or doctor's office? Yes When did it last happen?childhood If all above answers are "NO", may proceed with cephalosporin use.    Marland Kitchen Zoloft [Sertraline Hcl] Other (See Comments)    Reaction: "made crazy"  . Milk-Related Compounds Diarrhea  . Lactose Intolerance (Gi)     VITALS:  Blood pressure (!) 96/53, pulse 73, temperature 98.7 F  (37.1 C), temperature source Oral, resp. rate 16, height 5' 2"  (1.575 m), weight 70 kg, SpO2 98 %.  PHYSICAL EXAMINATION:   Physical Exam  GENERAL:  63 y.o.-year-old patient lying in the bed with no acute distress.  EYES: Pupils equal, round, reactive to light and accommodation. No scleral icterus. Extraocular muscles intact.  HEENT: Head atraumatic, normocephalic. Oropharynx and nasopharynx clear.  NECK:  Supple, no jugular venous distention. No thyroid enlargement, no tenderness.  LUNGS: Improved breath sounds bilaterally, has bibasilar crepitations. No use of accessory muscles of respiration.  CARDIOVASCULAR: S1, S2 normal. No murmurs, rubs, or gallops.  ABDOMEN: Soft, nontender, distended. Bowel sounds present. No organomegaly or mass.  EXTREMITIES: No cyanosis, clubbing or edema b/l.    NEUROLOGIC: Cranial nerves II through XII are intact. No focal Motor or sensory deficits b/l.   PSYCHIATRIC: The patient is alert and oriented x 3.  SKIN: No obvious rash, lesion, or ulcer.   LABORATORY PANEL:   CBC Recent Labs  Lab 03/21/18 0507  WBC 5.4  HGB 9.2*  HCT 28.2*  PLT 96*   ------------------------------------------------------------------------------------------------------------------ Chemistries  Recent Labs  Lab 03/17/18 0502  03/22/18 0617  NA 129*   < > 131*  K 4.2   < > 5.1  CL 99   < > 104  CO2 23   < > 23  GLUCOSE 86   < > 88  BUN 23   < > 42*  CREATININE 1.67*   < > 2.30*  CALCIUM 8.3*   < > 8.5*  AST 33  --   --  ALT 22  --   --   ALKPHOS 67  --   --   BILITOT 2.0*  --   --    < > = values in this interval not displayed.   ------------------------------------------------------------------------------------------------------------------  Cardiac Enzymes No results for input(s): TROPONINI in the last 168 hours. ------------------------------------------------------------------------------------------------------------------  RADIOLOGY:  Ct Abdomen  Pelvis Wo Contrast  Result Date: 03/20/2018 CLINICAL DATA:  History of cirrhosis of liver, dyspnea. Hepatic hydrothorax. Fluid with lymphocytes requiring rule out lymphoma. EXAM: CT ABDOMEN AND PELVIS WITHOUT CONTRAST TECHNIQUE: Multidetector CT imaging of the abdomen and pelvis was performed following the standard protocol without IV contrast. COMPARISON:  CT abdomen dated 03/06/2018 FINDINGS: Lower chest: Again noted is a large LEFT pleural effusion, incompletely imaged. Lingular consolidation is likely associated atelectasis. Hepatobiliary: Cirrhotic appearing liver. Gallstones are present within the nondistended gallbladder. No bile duct dilatation seen. Pancreas: Unremarkable. No pancreatic ductal dilatation or surrounding inflammatory changes. Spleen: Normal in size without focal abnormality. Adrenals/Urinary Tract: Kidneys are unremarkable without mass, stone or hydronephrosis. No perinephric fluid. No ureteral or bladder calculi identified. Bladder is obscured by overlying ascites. Stomach/Bowel: No dilated large or small bowel loops. 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 Appendix is normal. Stomach is unremarkable. Vascular/Lymphatic: Aortic atherosclerosis. Enlarged lymph node adjacent to the pancreatic head, 1.6 cm, better seen on the recent contrast-enhanced CT of 03/06/2018. No other enlarged lymph nodes appreciated in the abdomen or pelvis. Reproductive: Uterus appears normal. Ascites obscures visualization of the bilateral adnexa. Other: Moderate amount of ascites within the abdomen and pelvis, largest component within the pelvis. No evidence of circumscribed fluid collection or abscess. No free intraperitoneal air. Musculoskeletal: No acute or suspicious osseous finding. Superficial soft tissues are unremarkable. IMPRESSION: 1. Cirrhotic liver. 2. Moderate amount of ascites within the abdomen and pelvis, largest component within the pelvis. 3. Enlarged lymph node adjacent to the pancreatic  head, measuring 1.6 cm, better seen on the recent contrast-enhanced CT of 03/06/2016, favor reactive over neoplastic etiology given the absence of additional lymphadenopathy. 4. Large LEFT pleural effusion, incompletely imaged. 5. Cholelithiasis without evidence of acute cholecystitis. Aortic Atherosclerosis (ICD10-I70.0). Electronically Signed   By: Franki Cabot M.D.   On: 03/20/2018 14:20     ASSESSMENT AND PLAN:   63 year old female patient with history of cirrhosis of liver currently under hospitalist service for dyspnea secondary to pleural effusion  -Status post hypoxic respiratory failure secondary to large pleural effusion Status post ultrasound-guided thoracentesis 2.3 L fluid removed in this hospitalization Thoracentesis has been done twice during this hospitalization  -Hepatic hydrothorax confirmed, seen by gastroenterology, recommends TIPS procedure but concerned about her lymphocyte count, recommends possible malignant ascites/TB.  Flow cytometry, oncology consult recommended.   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  Status post thoracentesis in this hospitalization twice Pulmonary follow-up appreciated, fluid culture has been negative so far.  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 Management plans discussed with the patient, family and they are in agreement.  CODE STATUS: Full code  DVT Prophylaxis: SCDs  TOTAL TIME TAKING CARE OF THIS PATIENT: 38 minutes.   POSSIBLE D/C IN 2-3 DAYS, DEPENDING ON CLINICAL CONDITION.  Epifanio Lesches M.D on 03/22/2018 at 10:52 AM  Between 7am to 6pm - Pager - [phone]  After 6pm go to [email or web address] - password [email or web address] [facility]  Office  [phone]  CC: Primary care physician; Marnee Guarneri, MD  Note: This dictation was prepared with Dragon dictation along with smaller phrase technology. Any transcriptional errors that result from this process are unintentional.

## 2018-03-22 NOTE — Progress Notes (Signed)
Attempted to call report to 1C.  No answer at this time

## 2018-03-23 ENCOUNTER — Encounter: Payer: Self-pay | Admitting: Internal Medicine

## 2018-03-23 DIAGNOSIS — D72820 Lymphocytosis (symptomatic): Secondary | ICD-10-CM

## 2018-03-23 DIAGNOSIS — D696 Thrombocytopenia, unspecified: Secondary | ICD-10-CM

## 2018-03-23 DIAGNOSIS — N183 Chronic kidney disease, stage 3 (moderate): Secondary | ICD-10-CM

## 2018-03-23 DIAGNOSIS — D649 Anemia, unspecified: Secondary | ICD-10-CM

## 2018-03-23 DIAGNOSIS — K7460 Unspecified cirrhosis of liver: Principal | ICD-10-CM

## 2018-03-23 LAB — BODY FLUID CULTURE: Culture: NO GROWTH

## 2018-03-23 LAB — FOLATE: Folate: 9.4 ng/mL (ref >5.9)

## 2018-03-23 NOTE — Progress Notes (Signed)
[facility] [place] NAME: Tayley Mudrick    MR#:  [id number]  DATE OF BIRTH:  1955-05-22  SUBJECTIVE: Patient complains of shortness of breath this morning.  CHIEF COMPLAINT:   Chief Complaint  Patient presents with  . Shortness of Breath   patient has hepatic hydrothorax.  Has shortness of breath.  Denies any abdominal pain.  REVIEW OF SYSTEMS:    ROS  CONSTITUTIONAL: No documented fever. No fatigue, weakness. No weight gain, no weight loss.  EYES: No blurry or double vision.  ENT: No tinnitus. No postnasal drip. No redness of the oropharynx.  RESPIRATORY: Mild cough, no wheeze, no hemoptysis. mild dyspnea.  CARDIOVASCULAR: No chest pain. No orthopnea. No palpitations. No syncope.  GASTROINTESTINAL: No nausea, no vomiting or diarrhea. No abdominal pain. No melena or hematochezia.  GENITOURINARY: No dysuria or hematuria.  ENDOCRINE: No polyuria or nocturia. No heat or cold intolerance.  HEMATOLOGY: No anemia. No bruising. No bleeding.  INTEGUMENTARY: No rashes. No lesions.  MUSCULOSKELETAL: No arthritis. No swelling. No gout.  NEUROLOGIC: No numbness, tingling, or ataxia. No seizure-type activity.  PSYCHIATRIC: No anxiety. No insomnia. No ADD.   DRUG ALLERGIES:   Allergies  Allergen Reactions  . Biaxin [Clarithromycin] Shortness Of Breath and Rash  . Penicillins Anaphylaxis and Other (See Comments)    Did it involve swelling of the face/tongue/throat, SOB, or low BP? Unknown Did it involve sudden or severe rash/hives, skin peeling, or any reaction on the inside of your mouth or nose? Unknown Did you need to seek medical attention at a hospital or doctor's office? Yes When did it last happen?childhood If all above answers are "NO", may proceed with cephalosporin use.    Marland Kitchen Zoloft [Sertraline Hcl] Other (See Comments)    Reaction: "made crazy"  . Milk-Related Compounds Diarrhea  . Lactose Intolerance (Gi)     VITALS:  Blood  pressure (!) 119/46, pulse 76, temperature (!) 97.5 F (36.4 C), temperature source Oral, resp. rate 16, height 5' 2"  (1.575 m), weight 68.7 kg, SpO2 96 %.  PHYSICAL EXAMINATION:   Physical Exam  GENERAL:  63 y.o.-year-old patient lying in the bed with no acute distress.  EYES: Pupils equal, round, reactive to light  No scleral icterus. Extraocular muscles intact.  HEENT: Head atraumatic, normocephalic. Oropharynx and nasopharynx clear.  NECK:  Supple, no jugular venous distention. No thyroid enlargement, no tenderness.  LUNGS: Improved breath sounds bilaterally, has bibasilar crepitations. No use of accessory muscles of respiration.  CARDIOVASCULAR: S1, S2 normal. No murmurs, rubs, or gallops.  ABDOMEN: Soft, nontender, distended. Bowel sounds present. No organomegaly or mass.  EXTREMITIES: No cyanosis, clubbing or edema b/l.    NEUROLOGIC: Cranial nerves II through XII are intact. No focal Motor or sensory deficits b/l.   PSYCHIATRIC: The patient is alert and oriented x 3.  SKIN: No obvious rash, lesion, or ulcer.   LABORATORY PANEL:   CBC Recent Labs  Lab 03/21/18 0507  WBC 5.4  HGB 9.2*  HCT 28.2*  PLT 96*   ------------------------------------------------------------------------------------------------------------------ Chemistries  Recent Labs  Lab 03/17/18 0502  03/22/18 0617  NA 129*   < > 131*  K 4.2   < > 5.1  CL 99   < > 104  CO2 23   < > 23  GLUCOSE 86   < > 88  BUN 23   < > 42*  CREATININE 1.67*   < > 2.30*  CALCIUM 8.3*   < >  8.5*  AST 33  --   --   ALT 22  --   --   ALKPHOS 67  --   --   BILITOT 2.0*  --   --    < > = values in this interval not displayed.   ------------------------------------------------------------------------------------------------------------------  Cardiac Enzymes No results for input(s): TROPONINI in the last 168  hours. ------------------------------------------------------------------------------------------------------------------  RADIOLOGY:  No results found.   ASSESSMENT AND PLAN:   63 year old female patient with history of cirrhosis of liver currently under hospitalist service for dyspnea secondary to pleural effusion  -Status post hypoxic respiratory failure secondary to large pleural effusion Status post ultrasound-guided thoracentesis 2.3 L fluid removed in this hospitalization Thoracentesis has been done twice during this hospitalization.  Hypoxia is resolving, maintaining 98% on 2 L. -Hepatic hydrothorax confirmed, seen by gastroenterology, recommends TIPS procedure but concerned about her lymphocyte count, follow-up with oncology, document recommends paracentesis again for cytology, flow cytometry.  Ordered the paracentesis for tomorrow. 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  Status post thoracentesis in this hospitalization twice  Pulmonary follow-up appreciated, fluid culture has been negative so far.  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  -History of depression Continue BuSpar and citalopram  -Cirrhosis of liver probably from fatty liver disease, resumed Lasix, Aldactone, low-dose beta-blocker.  Patient develops hypotension, continue very low-dose Lasix as BP permits.  Patient symptoms of shortness of breath are better with Lasix. No evidence of spontaneous bacterial peritonitis  All the  records are reviewed and case discussed with Care Management/Social Worker. Management plans discussed with the patient, family and they are in agreement.  CODE STATUS: Full code  DVT Prophylaxis: SCDs  TOTAL TIME TAKING CARE OF THIS PATIENT: 38 minutes.  More than 50% time spent in counseling, coordination of care.  Discussed the case with Dr. Rogue Bussing, ordered paracentesis tomorrow for cytology, flow cytometry.  Discussed with patient also. POSSIBLE D/C IN 2-3 DAYS, DEPENDING ON CLINICAL CONDITION.  Epifanio Lesches M.D on 03/23/2018 at 12:44 PM  Between 7am to 6pm - Pager - [phone]  After 6pm go to [email or web address] - password [email or web address] [facility]  Office  [phone]  CC: Primary care physician; Marnee Guarneri, MD  Note: This dictation was prepared with Dragon dictation along with smaller phrase technology. Any transcriptional errors that result from this process are unintentional.

## 2018-03-23 NOTE — Assessment & Plan Note (Addendum)
#  63 year old female patient currently in the hospital for ascites/cirrhosis; also left-sided pleural effusion with lymphocytosis in the acetic/pleural fluid  # Lymphocytosis-in the acetic and pleural fluid/transudative.  Cytology negative for malignancy.  Imaging CT chest and pelvis-no obvious concerns for malignancy or lymphoma based on imaging.  Discussed with pathology-review of lymphocytosis from the pleural/ascitic fluid-not suggestive of lymphoma. Clinically concerns for lymphoma/active malignancy causing effusions is quite low-based upon discussion with pathology/review of imaging.  However flow cytometry of ascites fluid could be considered if malignancy has to be ruled out prior to TIPS procedure.   #Anemia/thrombocytopenia-macrocytosis likely secondary to liver disease.  No evidence of hypersplenism/splenomegaly.  Rule out other causes.  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  As per GI awaiting possible TIPS procedure if malignancy/infection ruled out.  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   Thank you Dr.Konidena for allowing me to participate in the care of your pleasant patient. Please do not hesitate to contact me with questions or concerns in the interim. Discussed with Dr. Vianne Bulls.

## 2018-03-23 NOTE — Consult Note (Signed)
[facility] CONSULT NOTE  Patient Care Team: Marnee Guarneri, MD as PCP - General (Family Medicine)  CHIEF COMPLAINTS/PURPOSE OF CONSULTATION:  Lymphocytosis pleural/peritoneal fluid  HISTORY OF PRESENTING ILLNESS:  Kelly Mclean 63 y.o.  female currently admitted to hospital for abdominal distention/left-sided pleural effusion.  Patient has been evaluated by GI.  Patient symptoms are thought to be related to decompensation of cirrhosis leading to abdominal ascites and left-sided pleural effusion.  Patient had paracentesis x2; and also thoracentesis x2.  Fluid is transudative/sec. portal hypertension; however cytology on ascites/pleural fluid suggestive of lymphocytosis.  GI is considering TIPS-however needing to rule out malignancy/infection as cause of lymphocytosis.   Of note patient states that she had a bone marrow biopsy in [place] 1 to 2 years ago for low hemoglobin/platelets.  Patient states it was thought to be "benign"-and was not recommended any further treatments.  Patient also aware of " scarring of her liver"-however denies any fever diagnosis of cirrhosis.  She denies history of alcohol/hepatitis.  Patient currently denies any pain.  No fever chills.  No night sweats.  Review of Systems  Constitutional: Positive for malaise/fatigue. Negative for chills, diaphoresis, fever and weight loss.  HENT: Negative for nosebleeds and sore throat.   Eyes: Negative for double vision.  Respiratory: Positive for cough and shortness of breath. Negative for hemoptysis, sputum production and wheezing.   Cardiovascular: Negative for chest pain, palpitations, orthopnea and leg swelling.  Gastrointestinal: Positive for abdominal pain. Negative for blood in stool, constipation, diarrhea, heartburn, melena, nausea and vomiting.       Abdominal distention.  Genitourinary: Negative for dysuria, frequency and urgency.  Musculoskeletal: Positive for back pain and joint pain.  Skin:  Negative.  Negative for itching and rash.  Neurological: Negative for dizziness, tingling, focal weakness, weakness and headaches.  Endo/Heme/Allergies: Does not bruise/bleed easily.  Psychiatric/Behavioral: Negative for depression. The patient is not nervous/anxious and does not have insomnia.      MEDICAL HISTORY:  Past Medical History:  Diagnosis Date  . Anemia   . Asthma   . Cirrhosis ([place])   . Hypertension   . Pleural effusion     SURGICAL HISTORY: Past Surgical History:  Procedure Laterality Date  . TUBAL LIGATION      SOCIAL HISTORY: Social History   Socioeconomic History  . Marital status: Married    Spouse name: Not on file  . Number of children: Not on file  . Years of education: Not on file  . Highest education level: Not on file  Occupational History  . Not on file  Social Needs  . Financial resource strain: Not on file  . Food insecurity:    Worry: Not on file    Inability: Not on file  . Transportation needs:    Medical: Not on file    Non-medical: Not on file  Tobacco Use  . Smoking status: Former Smoker    Last attempt to quit: 1990    Years since quitting: 30.1  . Smokeless tobacco: Never Used  Substance and Sexual Activity  . Alcohol use: Not Currently  . Drug use: Not Currently  . Sexual activity: Not Currently  Lifestyle  . Physical activity:    Days per week: Not on file    Minutes per session: Not on file  . Stress: Not on file  Relationships  . Social connections:    Talks on phone: Not on file    Gets together: Not on file  Attends religious service: Not on file    Active member of club or organization: Not on file    Attends meetings of clubs or organizations: Not on file    Relationship status: Not on file  . Intimate partner violence:    Fear of current or ex partner: Not on file    Emotionally abused: Not on file    Physically abused: Not on file    Forced sexual activity: Not on file  Other Topics Concern  . Not on  file  Social History Narrative   Patient moved from [place].  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  Lives with her husband in [place].  No alcohol abuse or smoking.    FAMILY HISTORY: Family History  Problem Relation Age of Onset  . Breast cancer Maternal Aunt 84    ALLERGIES:  is allergic to biaxin [clarithromycin]; penicillins; zoloft [sertraline hcl]; milk-related compounds; and lactose intolerance (gi).  MEDICATIONS:  Current Facility-Administered Medications  Medication Dose Route Frequency Provider Last Rate Last Dose  . acetaminophen (TYLENOL) tablet 650 mg  650 mg Oral Q6H PRN Bettey Costa, MD       Or  . acetaminophen (TYLENOL) suppository 650 mg  650 mg Rectal Q6H PRN Mody, Sital, MD      . albuterol (PROVENTIL) (2.5 MG/3ML) 0.083% nebulizer solution 2.5 mg  2.5 mg Inhalation Q6H PRN Lance Coon, MD   2.5 mg at 03/17/18 2218  . atenolol (TENORMIN) tablet 12.5 mg  12.5 mg Oral Daily Lance Coon, MD   12.5 mg at 03/23/18 1032  . busPIRone (BUSPAR) tablet 10 mg  10 mg Oral TID Saundra Shelling, MD   10 mg at 03/23/18 1622  . citalopram (CELEXA) tablet 20 mg  20 mg Oral Daily Pyreddy, Pavan, MD   20 mg at 03/23/18 1031  . docusate (COLACE) 50 MG/5ML liquid 100 mg  100 mg Oral BID Saundra Shelling, MD   100 mg at 03/22/18 2300  . famotidine (PEPCID) tablet 20 mg  20 mg Oral QHS Oswald Hillock, RPH   20 mg at 03/22/18 2300  . feeding supplement (BOOST / RESOURCE BREEZE) liquid 1 Container  1 Container Oral TID BM Pyreddy, Reatha Harps, MD   1 Container at 03/22/18 1023  . furosemide (LASIX) tablet 20 mg  20 mg Oral Daily Pyreddy, Pavan, MD   20 mg at 03/23/18 1031  . gabapentin (NEURONTIN) capsule 200 mg  200 mg Oral Corwin Levins, MD   200 mg at 03/22/18 2300  . hydrocerin (EUCERIN) cream   Topical BID Fritzi Mandes, MD      . HYDROcodone-acetaminophen (NORCO/VICODIN) 5-325 MG per tablet 1-2 tablet  1-2 tablet Oral Q4H PRN Bettey Costa, MD   1 tablet at 03/18/18 1042  . montelukast  (SINGULAIR) tablet 10 mg  10 mg Oral Corwin Levins, MD   10 mg at 03/22/18 2300  . multivitamin with minerals tablet 1 tablet  1 tablet Oral Daily Pyreddy, Pavan, MD      . ondansetron (ZOFRAN) tablet 4 mg  4 mg Oral Q6H PRN Bettey Costa, MD   4 mg at 03/23/18 1717   Or  . ondansetron (ZOFRAN) injection 4 mg  4 mg Intravenous Q6H PRN Mody, Sital, MD      . polyethylene glycol (MIRALAX / GLYCOLAX) packet 17 g  17 g Oral Daily PRN Mody, Sital, MD      . primidone (MYSOLINE) tablet 50 mg  50 mg Oral BID Lance Coon, MD  50 mg at 03/23/18 1031  . sodium chloride (OCEAN) 0.65 % nasal spray 1 spray  1 spray Each Nare PRN Saundra Shelling, MD   1 spray at 03/16/18 0414  . sodium chloride flush (NS) 0.9 % injection 3 mL  3 mL Intravenous Q12H Pyreddy, Pavan, MD   3 mL at 03/23/18 1026  . sodium chloride flush (NS) 0.9 % injection 3 mL  3 mL Intravenous PRN Pyreddy, Reatha Harps, MD      . spironolactone (ALDACTONE) tablet 25 mg  25 mg Oral Daily Pyreddy, Pavan, MD   25 mg at 03/23/18 1031  . trolamine salicylate (ASPERCREME) 10 % cream   Topical PRN Bettey Costa, MD          .  PHYSICAL EXAMINATION:  Vitals:   03/23/18 0350 03/23/18 1626  BP: (!) 119/46 (!) 125/48  Pulse: 76 71  Resp: 16 (!) 24  Temp: (!) 97.5 F (36.4 C) 98.4 F (36.9 C)  SpO2: 96% 95%   Filed Weights   03/21/18 0458 03/22/18 0315 03/22/18 2031  Weight: 155 lb 1.6 oz (70.4 kg) 154 lb 5.2 oz (70 kg) 151 lb 7.3 oz (68.7 kg)    Physical Exam  Constitutional: She is oriented to person, place, and time and well-developed, well-nourished, and in no distress.  Appears pale.  Alone.  HENT:  Head: Normocephalic and atraumatic.  Mouth/Throat: Oropharynx is clear and moist. No oropharyngeal exudate.  Eyes: Pupils are equal, round, and reactive to light.  Neck: Normal range of motion. Neck supple.  Cardiovascular: Normal rate and regular rhythm.  Pulmonary/Chest: No respiratory distress. She has no wheezes.  Decreased air entry  bilaterally left more than right.  Abdominal: Soft. Bowel sounds are normal. She exhibits distension. She exhibits no mass. There is no abdominal tenderness. There is no rebound and no guarding.  Musculoskeletal: Normal range of motion.        General: No tenderness or edema.  Neurological: She is alert and oriented to person, place, and time.  Skin: Skin is warm.  Psychiatric: Affect normal.     LABORATORY DATA:  I have reviewed the data as listed Lab Results  Component Value Date   WBC 5.4 03/21/2018   HGB 9.2 (L) 03/21/2018   HCT 28.2 (L) 03/21/2018   MCV 103.3 (H) 03/21/2018   PLT 96 (L) 03/21/2018   Recent Labs    02/19/18 1142  03/15/18 0451  03/17/18 0502  03/20/18 0444 03/21/18 0507 03/22/18 0617  NA  --    < > 129*   < > 129*   < > 132* 132* 131*  K  --    < > 4.6   < > 4.2   < > 5.0 4.9 5.1  CL  --    < > 97*   < > 99   < > 103 104 104  CO2  --    < > 26   < > 23   < > _0 GLUCOSE  --    < > 72   < > 86   < > 79 95 88  BUN  --    < > 19   < > 23   < > 39* 40* 42*  CREATININE  --    < > 1.65*   < > 1.67*   < > 2.57* 2.31* 2.30*  CALCIUM  --    < > 8.7*   < > 8.3*   < > 8.5* 8.4*  8.5*  GFRNONAA  --    < > 33*   < > 33*   < > 19* 22* 22*  GFRAA  --    < > 38*   < > 38*   < > 22* 26* 26*  PROT 7.0  --  5.5*  --  5.8*  --   --   --   --   ALBUMIN 3.3*  --  2.4*  --  2.6*  --   --   --   --   AST  --   --  29  --  33  --   --   --   --   ALT  --   --  20  --  22  --   --   --   --   ALKPHOS  --   --  51  --  67  --   --   --   --   BILITOT 2.8*  --  1.8*  --  2.0*  --   --   --   --   BILIDIR  --   --  0.4*  --   --   --   --   --   --   IBILI  --   --  1.4*  --   --   --   --   --   --    < > = values in this interval not displayed.    RADIOGRAPHIC STUDIES: I have personally reviewed the radiological images as listed and agreed with the findings in the report. Ct Abdomen Pelvis Wo Contrast  Result Date: 03/20/2018 CLINICAL DATA:  History of cirrhosis of  liver, dyspnea. Hepatic hydrothorax. Fluid with lymphocytes requiring rule out lymphoma. EXAM: CT ABDOMEN AND PELVIS WITHOUT CONTRAST TECHNIQUE: Multidetector CT imaging of the abdomen and pelvis was performed following the standard protocol without IV contrast. COMPARISON:  CT abdomen dated 03/06/2018 FINDINGS: Lower chest: Again noted is a large LEFT pleural effusion, incompletely imaged. Lingular consolidation is likely associated atelectasis. Hepatobiliary: Cirrhotic appearing liver. Gallstones are present within the nondistended gallbladder. No bile duct dilatation seen. Pancreas: Unremarkable. No pancreatic ductal dilatation or surrounding inflammatory changes. Spleen: Normal in size without focal abnormality. Adrenals/Urinary Tract: Kidneys are unremarkable without mass, stone or hydronephrosis. No perinephric fluid. No ureteral or bladder calculi identified. Bladder is obscured by overlying ascites. Stomach/Bowel: No dilated large or small bowel loops. 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 Appendix is normal. Stomach is unremarkable. Vascular/Lymphatic: Aortic atherosclerosis. Enlarged lymph node adjacent to the pancreatic head, 1.6 cm, better seen on the recent contrast-enhanced CT of 03/06/2018. No other enlarged lymph nodes appreciated in the abdomen or pelvis. Reproductive: Uterus appears normal. Ascites obscures visualization of the bilateral adnexa. Other: Moderate amount of ascites within the abdomen and pelvis, largest component within the pelvis. No evidence of circumscribed fluid collection or abscess. No free intraperitoneal air. Musculoskeletal: No acute or suspicious osseous finding. Superficial soft tissues are unremarkable. IMPRESSION: 1. Cirrhotic liver. 2. Moderate amount of ascites within the abdomen and pelvis, largest component within the pelvis. 3. Enlarged lymph node adjacent to the pancreatic head, measuring 1.6 cm, better seen on the recent contrast-enhanced CT of 03/06/2016,  favor reactive over neoplastic etiology given the absence of additional lymphadenopathy. 4. Large LEFT pleural effusion, incompletely imaged. 5. Cholelithiasis without evidence of acute cholecystitis. Aortic Atherosclerosis (ICD10-I70.0). Electronically Signed   By: Franki Cabot M.D.   On: 03/20/2018 14:20  Dg Chest 1 View  Result Date: 03/14/2018 CLINICAL DATA:  Status post thoracentesis EXAM: CHEST  1 VIEW COMPARISON:  March 14, 2018 12:56 p.m. FINDINGS: The heart size and mediastinal contours are within normal limits. There is small left pleural effusion significantly decreased compared prior exam. Consolidation left lung base is noted. There is no pneumothorax. The right lung is clear. The visualized skeletal structures are stable. IMPRESSION: There is small left pleural effusion, significantly decreased compared prior exam. Consolidation of left lung base is identified. There is no pneumothorax. Electronically Signed   By: Abelardo Diesel M.D.   On: 03/14/2018 16:19   Ct Chest Wo Contrast  Result Date: 03/14/2018 CLINICAL DATA:  Pleural effusion and dyspnea since yesterday. EXAM: CT CHEST WITHOUT CONTRAST TECHNIQUE: Multidetector CT imaging of the chest was performed following the standard protocol without IV contrast. COMPARISON:  03/14/2018 CXR FINDINGS: Cardiovascular: Common arterial branch of the right brachiocephalic left common carotid arteries. Atherosclerosis of left subclavian artery origin. Nonaneurysmal atherosclerotic aorta. The unenhanced pulmonary vessels are unremarkable. The heart size is normal with small anterior pericardial effusion without thickening noted. Mediastinum/Nodes: Small subcentimeter prevascular and paratracheal lymph nodes. 1 cm short axis subcarinal lymph node. Patent trachea and mainstem bronchi. Unremarkable CT appearance of the esophagus. The thyroid gland is unremarkable without dominant mass. Lungs/Pleura: Moderate to large layering left effusion with adjacent  atelectasis. The right lung is clear. No pneumothorax or dominant mass. Upper Abdomen: Cirrhotic appearance of the liver with moderate to large volume of ascites. Musculoskeletal: No chest wall mass or suspicious bone lesions identified. Mild thoracic spondylosis. IMPRESSION: 1. Moderate to large layering left pleural effusion with adjacent atelectasis. 2. Cirrhotic liver with moderate to large volume of ascites. Aortic Atherosclerosis (ICD10-I70.0). Electronically Signed   By: Ashley Royalty M.D.   On: 03/14/2018 22:17   US Abdomen Complete  Result Date: 03/04/2018 CLINICAL DATA:  62 year old with hepatic cirrhosis. EXAM: ABDOMEN ULTRASOUND COMPLETE COMPARISON:  Visualized upper abdomen on CT chest 02/18/2018. FINDINGS: Gallbladder: Numerous shadowing gallstones, the largest measuring approximately 9 mm. Echogenic sludge. Mild gallbladder wall thickening up to approximately 4 mm. No pericholecystic fluid. Negative sonographic Murphy's sign according to the ultrasound technologist. Common bile duct: Diameter: Approximately 4 mm. No visible bile duct stones. Liver: Diffusely coarsened echotexture and irregular hepatic contour. No focal hepatic parenchymal abnormalities. Mildly increased echotexture diffusely. Portal vein is patent on color Doppler imaging with normal direction of blood flow towards the liver. IVC: Patent. Pancreas: Hypoechoic mass adjacent to or involving the pancreatic head or porta hepatis lymph node measuring approximately 2.4 x 1.4 x 2.8 cm. Visualized pancreas otherwise unremarkable. The tail is obscured by overlying bowel gas. The pancreas was not included on the recent CT chest. Spleen: Enlarged, measuring approximately 15.8 x 15.8 x 5.2 cm, giving a volume of approximately 681 mL. No focal parenchymal abnormality. Right Kidney: Length: Approximately 9.0 cm. No hydronephrosis. Well-preserved cortex. Mildly echogenic parenchyma. Approximate 1.2 x 1.1 x 2.0 cm mildly complex cyst with a thin  internal septation involving the UPPER pole of the RIGHT kidney. No solid renal mass. Left Kidney: Length: Approximately 8.2 cm. No hydronephrosis. Well-preserved cortex. Mildly echogenic parenchyma. No focal parenchymal abnormalities. Abdominal aorta: Normal in caliber throughout its visualized course in the abdomen with evidence of atherosclerosis. Maximum diameter 2.4 cm. Other findings: Small amount of ascites. Large LEFT pleural effusion. IMPRESSION: 1. Hepatic cirrhosis.  No focal hepatic parenchymal abnormality. 2. Hypoechoic mass adjacent to or involving the pancreatic head versus enlarged porta  hepatis lymph node. The pancreas was not included on the recent CT chest, so a CT of the abdomen and pelvis with contrast is recommended in further evaluation. 3. Cholelithiasis.  No sonographic evidence of acute cholecystitis. 4. Splenomegaly without focal splenic parenchymal abnormality, indicating portal hypertension. 5. Patent portal vein with normal antegrade hepatopetal flow. 6. Small amount of ascites. 7. Large LEFT pleural effusion. Electronically Signed   By: Evangeline Dakin M.D.   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 Plan for nuclear medicine ascites - hydrothorax examination. Plan for an ultrasound-guided paracentesis and injection of radiopharmaceutical into the ascites. Left thoracentesis was performed prior to this procedure. EXAM: ULTRASOUND GUIDED PARACENTESIS INJECTION OF RADIOPHARMACEUTICAL INTO ASCITES MEDICATIONS: None. COMPLICATIONS: None immediate. PROCEDURE: Informed written consent was obtained from the patient after a discussion of the risks, benefits and alternatives to treatment. A timeout was performed prior to the initiation of the procedure. Initial ultrasound scanning demonstrates a small amount of ascites within the right upper abdominal quadrant. The right upper abdomen was prepped  and draped in the usual sterile fashion. 1% lidocaine with was used for local anesthesia. Following this, a 6 Fr Safe-T-Centesis catheter was introduced. An ultrasound image was saved for documentation purposes. The paracentesis was performed. 60 mL of opaque yellow fluid was removed. The radiopharmaceutical was injected through the Safe-T-Centesis catheter. Catheter was flushed with sterile saline. The catheter was removed and a dressing was applied. The patient tolerated the procedure well without immediate post procedural complication. FINDINGS: Small amount of fluid around the liver. Small amount of fluid in the left lower quadrant. 60 mL of fluid was removed from the right upper quadrant. The radiopharmaceutical was injected into the right upper quadrant. IMPRESSION: Successful ultrasound-guided paracentesis yielding 60 mL of ascites. Fluid was sent for labs. Radiopharmaceutical was injected through the paracentesis catheter for the nuclear medicine examination. Electronically Signed   By: Markus Daft M.D.   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 Patient is being evaluated for a hepatic hydrothorax. EXAM: NUCLEAR MEDICINE SULFUR COLLOID PERITONEAL SCINTIGRAPHY TECHNIQUE: Ultrasound-guided left thoracentesis was performed. 1.1 L of left pleural fluid was removed but not all of the fluid was removed. Subsequently, an ultrasound-guided paracentesis was performed in the right upper quadrant. 60 mL of peritoneal fluid was removed. Technetium 99 M sulfur colloid was injected through the paracentesis catheter. Paracentesis catheter was removed. RADIOPHARMACEUTICALS:  7 millicuries technetium 99 M sulfur colloid COMPARISON:  Chest CT 03/14/2018 FINDINGS: Imaging was obtained over 1 hour. The initial images demonstrated radiopharmaceutical throughout the peritoneal cavity. The largest  peritoneal pockets are in the right upper quadrant and left lower quadrant. Over time, radiopharmaceutical starts to accumulate in the left hemithorax. Findings are compatible with a trans diaphragmatic connection between the peritoneal cavity and the left pleural space. IMPRESSION: Study is positive for connection between the peritoneal space and left pleural space. 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 Electronically Signed   By: Markus Daft M.D.   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 Plan for nuclear medicine ascites - hydrothorax examination. Plan for an ultrasound-guided paracentesis and injection of radiopharmaceutical into the ascites. Left thoracentesis was performed prior to this procedure. EXAM: ULTRASOUND GUIDED PARACENTESIS INJECTION OF RADIOPHARMACEUTICAL  INTO ASCITES MEDICATIONS: None. COMPLICATIONS: None immediate. PROCEDURE: Informed written consent was obtained from the patient after a discussion of the risks, benefits and alternatives to treatment. A timeout was performed prior to the initiation of the procedure. Initial ultrasound scanning demonstrates a small amount of ascites within the right upper abdominal quadrant. The right upper abdomen was prepped and draped in the usual sterile fashion. 1% lidocaine with was used for local anesthesia. Following this, a 6 Fr Safe-T-Centesis catheter was introduced. An ultrasound image was saved for documentation purposes. The paracentesis was performed. 60 mL of opaque yellow fluid was removed. The radiopharmaceutical was injected through the Safe-T-Centesis catheter. Catheter was flushed with sterile saline. The catheter was removed and a dressing was applied. The patient tolerated the procedure well without immediate post procedural complication. FINDINGS: Small amount of fluid around the liver. Small amount of fluid in the  left lower quadrant. 60 mL of fluid was removed from the right upper quadrant. The radiopharmaceutical was injected into the right upper quadrant. IMPRESSION: Successful ultrasound-guided paracentesis yielding 60 mL of ascites. Fluid was sent for labs. Radiopharmaceutical was injected through the paracentesis catheter for the nuclear medicine examination. Electronically Signed   By: Markus Daft M.D.   On: 03/19/2018 17:06   Ct Abdomen W Wo Contrast  Result Date: 03/06/2018 CLINICAL DATA:  Pancreatic mass seen on earlier [place]. Pancreatic mass workup . Cirrhosis. Epigastric and LUQ pain. 21m of Omni 300 used. Pt could only tolerate laying on her left side for scan.^1028mOMNIPAQUE IOHEXOL 300 MG/ML SOLNPancreatic mass EXAM: CT ABDOMEN WITHOUT AND WITH CONTRAST TECHNIQUE: Multidetector CT imaging of the abdomen was performed following the standard protocol before and following the bolus administration of intravenous contrast. CONTRAST:  10015mMNIPAQUE IOHEXOL 300 MG/ML  SOLN COMPARISON:  Ultrasound 03/04/2010 FINDINGS: Lower chest:  Large LEFT pleural effusion with passive atelectasis. Hepatobiliary: Nodular liver. Caudate lobe is enlarged. Portal veins are patent. Multiple gallstones within lumen gallbladder. No enhancing hepatic lesion. No intrahepatic or extrahepatic biliary duct dilatation. Pancreas: No abnormality of the pancreatic head body or tail. No duct dilatation. Periampullary duodenum diverticulum noted. Lymph node position inferior to the pancreas adjacent to the portal vein measures 16 mm (image 49/4) Spleen: Spleen borderline enlarged. Adrenals/urinary tract: LEFT kidney is normal. RIGHT kidney normal. No adrenal abnormality Stomach/Bowel: Stomach and limited of the small bowel is unremarkable Vascular/Lymphatic: Abdominal aortic normal caliber. No retroperitoneal periportal lymphadenopathy. Musculoskeletal: No aggressive osseous lesion IMPRESSION: 1. No pancreatic lesion identified. 2.  Peripancreatic lymph node is mildly enlarged. 3. Small periampullary duodenum diverticulum present. 4. Morphologic changes in liver consistent with cirrhosis. No biliary duct dilatation of the intrahepatic or extrahepatic bile ducts. 5. No enhancing hepatic lesion. 6. Cholelithiasis. 7. Moderate volume of intraperitoneal free fluid. 8. Large LEFT pleural effusion Electronically Signed   By: SteSuzy BouchardD.   On: 03/06/2018 16:31   Dg Bone Density (dxa)  Result Date: 03/11/2018 EXAM: DUAL X-RAY ABSORPTIOMETRY (DXA) FOR BONE MINERAL DENSITY IMPRESSION: Dear Dr, RosLutricia Feilour patient LesLeniya Breitmpleted a FRAX assessment on 03/11/2018 using the LunMoonachienalysis version: 14.10) manufactured by [employer] following summarizes the results of our evaluation. PATIENT BIOGRAPHICAL: Name: LapYanin, Muhlesteintient ID: [id number] Date: 02/10-13-1957ight:    62.5 in. Gender:     Female    [age]       Weight:    158.4 lbs. Ethnicity:  White  Exam Date: 03/11/2018 FRAX* RESULTS:  (version: 3.5) 10-year Probability of Fracture1 Major Osteoporotic Fracture2 Hip Fracture 8.1% 0.7% Population: [place] (Caucasian) Risk Factors: None Based on Femur (Left) Neck BMD 1 -The 10-year probability of fracture may be lower than reported if the patient has received treatment. 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 ASSESSMENT: The probability of a major osteoporotic fracture is 8.1% within the next ten years. The probability of a hip fracture is 0.7% within the next ten years. . Technologist: [facility] PATIENT BIOGRAPHICAL: Name: Zaira, Iacovelli Patient ID: [id number] Birth Date: 26-Jun-1955 Height: 62.5 in. Gender: Female Exam Date: 03/11/2018 Weight: 158.4 lbs. Indications:  Asthma, Caucasian, Cirrhosis, Early Menopause, Postmenopausal, Previous Smoker Fractures: Treatments: Gabapentin, primidone ASSESSMENT: The BMD measured at Femur Neck Left is 0.833 g/cm2 with a T-score of -1.5. This patient is considered osteopenic according to [place] [facility]) criteria. The quality of the scan is good. Site Region Measured Measured WHO Young Adult BMD Date       Age      Classification T-score AP Spine L1-L4 03/11/2018 [age] Osteopenia -1.5 1.007 g/cm2 DualFemur Neck Left 03/11/2018 63.0 Osteopenia -1.5 0.833 g/cm2 World Health Organization [facility]) criteria for post-menopausal, Caucasian Women: Normal:       T-score at or above -1 SD Osteopenia:   T-score between -1 and -2.5 SD Osteoporosis: T-score at or below -2.5 SD RECOMMENDATIONS: 1. All patients should optimize calcium and vitamin D intake. 2. Consider FDA-approved medical therapies in postmenopausal women and men aged 52 years and older, based on the following: a. A hip or vertebral(clinical or morphometric) fracture b. T-score < -2.5 at the femoral neck or spine after appropriate evaluation to exclude secondary causes c. Low bone mass (T-score between -1.0 and -2.5 at the femoral neck or spine) and a 10-year probability of a hip fracture > 3% or a 10-year probability of a major osteoporosis-related fracture > 20% based on the [place]-adapted WHO algorithm d. Clinician judgment and/or patient preferences may indicate treatment for people with 10-year fracture probabilities above or below these levels FOLLOW-UP: People with diagnosed cases of osteoporosis or at high risk for fracture should have regular bone mineral density tests. For patients eligible for Medicare, routine testing is allowed once every 2 years. The testing frequency can be increased to one year for patients who have rapidly progressing disease, those who are receiving or discontinuing medical therapy to restore bone mass, or have additional risk factors. I have  reviewed this report, and agree with the above findings. [facility] Electronically Signed   By: Lowella Grip III M.D.   On: 03/11/2018 11:23   Dg Chest Port 1 View  Result Date: 03/19/2018 CLINICAL DATA:  Status post thoracentesis. EXAM: PORTABLE CHEST 1 VIEW COMPARISON:  03/14/2018 FINDINGS: 1702 hours. Moderate to large left pleural effusion noted without evidence for pneumothorax. Left base collapse/consolidation. The cardio pericardial silhouette is enlarged. The visualized bony structures of the thorax are intact. Telemetry leads overlie the chest. IMPRESSION: 1. No evidence for pneumothorax status post thoracentesis. 2. Moderate to large left pleural effusion. Electronically Signed   By: Misty Stanley M.D.   On: 03/19/2018 18:56   Dg Chest Portable 1 View  Result Date: 03/14/2018 CLINICAL DATA:  Shortness of breath. EXAM: PORTABLE CHEST 1 VIEW COMPARISON:  Chest x-ray 03/06/2018. FINDINGS: Mediastinum and hilar structures normal. Large  left pleural effusion, increased in size from prior exam. Underlying left lung atelectasis/infiltrate can not be excluded. No pneumothorax. Heart size most likely stable. IMPRESSION: Large left pleural effusion, increased from prior exam. Electronically Signed   By: Marcello Moores  Register   On: 03/14/2018 13:19   Dg Chest Port 1 View  Result Date: 03/06/2018 CLINICAL DATA:  Post large volume left-sided thoracentesis. EXAM: PORTABLE CHEST 1 VIEW COMPARISON:  Chest radiograph-02/20/2018; CT abdomen pelvis-earlier same day FINDINGS: Interval reduction in persistent moderate size left-sided effusion post large volume thoracentesis. No pneumothorax. Improved aeration of left lung base with persistent left basilar heterogeneous/consolidative opacities. No definite evidence of right-sided pleural effusion. No new focal airspace opacities. Unchanged cardiac silhouette and mediastinal contours. No acute osseous abnormalities. IMPRESSION: Interval reduction in  persistent moderate sized left-sided effusion post thoracentesis. No pneumothorax. Electronically Signed   By: Sandi Mariscal M.D.   On: 03/06/2018 16:02   Mm 3d Screen Breast Bilateral  Result Date: 03/12/2018 CLINICAL DATA:  Screening. EXAM: DIGITAL SCREENING BILATERAL MAMMOGRAM WITH TOMO AND CAD COMPARISON:  Previous exam(s). ACR Breast Density Category b: There are scattered areas of fibroglandular density. FINDINGS: There are no findings suspicious for malignancy. Images were processed with CAD. IMPRESSION: No mammographic evidence of malignancy. A result letter of this screening mammogram will be mailed directly to the patient. RECOMMENDATION: Screening mammogram in one year. (Code:SM-B-01Y) BI-RADS CATEGORY  1: Negative. Electronically Signed   By: Dorise Bullion III M.D   On: 03/12/2018 14:44   Nm Hepato Biliary Leak  Result Date: 03/17/2018 CLINICAL DATA:  Cholelithiasis. Nausea and vomiting. Hepatic cirrhosis. EXAM: NUCLEAR MEDICINE HEPATOBILIARY IMAGING TECHNIQUE: Sequential images of the abdomen were obtained out to 60 minutes following intravenous administration of radiopharmaceutical. RADIOPHARMACEUTICALS:  5.1 mCi Tc-5m Choletec IV COMPARISON:  CT on 03/14/2018 FINDINGS: Delayed uptake and biliary excretion of activity by the liver is seen, consistent with known hepatic cirrhosis. Gallbladder activity is visualized, consistent with patency of cystic duct. Biliary activity passes into small bowel, consistent with patent common bile duct. IMPRESSION: No evidence of acute cholecystitis or biliary ductal dilatation. Hepatocellular dysfunction, consistent with hepatic cirrhosis. Electronically Signed   By: JEarle GellM.D.   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 Patient is being evaluated for a connection between the peritoneal and left pleural space with a nuclear  medicine examination. Patient has a large amount of left pleural fluid on ultrasound examination. Plan to remove some of the left pleural fluid prior to the nuclear medicine examination. EXAM: ULTRASOUND GUIDED LEFT THORACENTESIS MEDICATIONS: None. COMPLICATIONS: None immediate. PROCEDURE: An ultrasound guided thoracentesis was thoroughly discussed with the patient and questions answered. The benefits, risks, alternatives and complications were also discussed. The patient understands and wishes to proceed with the procedure. Written consent was obtained. Ultrasound was performed to localize and mark an adequate pocket of fluid in the left chest. The area was then prepped and draped in the normal sterile fashion. 1% Lidocaine was used for local anesthesia. Under ultrasound guidance a 6 Fr Safe-T-Centesis catheter was introduced. Thoracentesis was performed. The catheter was removed and a dressing applied. FINDINGS: A total of approximately 1.1 L of opaque yellow fluid was removed. Samples were sent to the laboratory as requested by the clinical team. IMPRESSION: Successful ultrasound guided left thoracentesis yielding 1.1 L of pleural fluid. Electronically Signed   By: AMarkus DaftM.D.   On:  03/19/2018 17:11   US Thoracentesis Asp Pleural Space W/img Guide  Result Date: 03/14/2018 CLINICAL DATA:  Recurrent left pleural effusion. EXAM: ULTRASOUND GUIDED LEFT THORACENTESIS COMPARISON:  None. PROCEDURE: An ultrasound guided thoracentesis was thoroughly discussed with the patient and questions answered. The benefits, risks, alternatives and complications were also discussed. The patient understands and wishes to proceed with the procedure. Written consent was obtained. Ultrasound was performed to localize and mark an adequate pocket of fluid in the left chest. The area was then prepped and draped in the normal sterile fashion. 1% Lidocaine was used for local anesthesia. Under ultrasound guidance a 6 French  Safe-T-Centesis catheter was introduced. Thoracentesis was performed. The catheter was removed and a dressing applied. COMPLICATIONS: None FINDINGS: A total of approximately 2.3 L of cloudy, yellowish-brown fluid was removed. A fluid sample was sent for laboratory analysis. IMPRESSION: Successful ultrasound guided left thoracentesis yielding 2.3 L of pleural fluid. Electronically Signed   By: Aletta Edouard M.D.   On: 03/14/2018 16:13   US Thoracentesis Asp Pleural Space W/img Guide  Result Date: 03/06/2018 INDICATION: History of cirrhosis now with recurrent symptomatic left-sided pleural effusion worrisome for hepatic hydrothorax. Please from ultrasound-guided thoracentesis for therapeutic purposes. EXAM: US THORACENTESIS ASP PLEURAL SPACE W/IMG GUIDE COMPARISON:  Ultrasound-guided thoracentesis-03/22/2018 (yielding 2.2 L of pleural fluid); CT abdomen pelvis-earlier same day MEDICATIONS: None. COMPLICATIONS: None immediate. TECHNIQUE: Informed written consent was obtained from the patient after a discussion of the risks, benefits and alternatives to treatment. A timeout was performed prior to the initiation of the procedure. Initial ultrasound scanning demonstrates a large anechoic left-sided pleural effusion. The lower chest was prepped and draped in the usual sterile fashion. 1% lidocaine was used for local anesthesia. An ultrasound image was saved for documentation purposes. An 8 Fr Safe-T-Centesis catheter was introduced. The thoracentesis was performed. Despite residual fluid within the left pleural space, the patient wished to terminate the procedure given left-sided chest pain. As such, the catheter was removed and a dressing was applied. The patient tolerated the procedure well without immediate post procedural complication. The patient was escorted to have an upright chest radiograph. FINDINGS: A total of approximately 1.9 liters of amber colored serous fluid was removed. IMPRESSION: Successful  ultrasound-guided left sided thoracentesis yielding 1.9 liters of pleural fluid. Electronically Signed   By: Sandi Mariscal M.D.   On: 03/06/2018 16:04    Lymphocytosis #63 year old female patient currently in the hospital for ascites/cirrhosis; also left-sided pleural effusion with lymphocytosis in the acetic/pleural fluid  # Lymphocytosis-in the acetic and pleural fluid/transudative.  Cytology negative for malignancy.  Imaging CT chest and pelvis-no obvious concerns for malignancy or lymphoma based on imaging.  Discussed with pathology-review of lymphocytosis from the pleural/ascitic fluid-not suggestive of lymphoma. Clinically concerns for lymphoma/active malignancy causing effusions is quite low-based upon discussion with pathology/review of imaging.  However flow cytometry of ascites fluid could be considered if malignancy has to be ruled out prior to TIPS procedure.   #Anemia/thrombocytopenia-macrocytosis likely secondary to liver disease.  No evidence of hypersplenism/splenomegaly.  Rule out other causes.  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  As per GI awaiting possible TIPS procedure if malignancy/infection ruled out.  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   Thank you Dr.Konidena for allowing me to participate in the care of your pleasant patient. Please do not hesitate to contact me with questions or concerns in the interim. Discussed with Dr. Vianne Bulls.    All questions were answered.  The patient knows to call the clinic with any problems, questions or concerns.   Cammie Sickle, MD 03/23/2018 5:48 PM

## 2018-03-24 ENCOUNTER — Inpatient Hospital Stay: Payer: Medicare (Managed Care)

## 2018-03-24 LAB — CBC
HCT: 27.9 % — ABNORMAL LOW (ref 36.0–46.0)
Hemoglobin: 8.8 g/dL — ABNORMAL LOW (ref 12.0–15.0)
MCH: 33.7 pg (ref 26.0–34.0)
MCHC: 31.5 g/dL (ref 30.0–36.0)
MCV: 106.9 fL — ABNORMAL HIGH (ref 80.0–100.0)
NRBC: 0 % (ref 0.0–0.2)
Platelets: 89 10*3/uL — ABNORMAL LOW (ref 150–400)
RBC: 2.61 MIL/uL — ABNORMAL LOW (ref 3.87–5.11)
RDW: 14.6 % (ref 11.5–15.5)
WBC: 4.3 10*3/uL (ref 4.0–10.5)

## 2018-03-24 LAB — VITAMIN B12: VITAMIN B 12: 946 pg/mL — AB (ref 180–914)

## 2018-03-24 LAB — BASIC METABOLIC PANEL
Anion gap: 3 — ABNORMAL LOW (ref 5–15)
BUN: 42 mg/dL — ABNORMAL HIGH (ref 8–23)
CO2: 24 mmol/L (ref 22–32)
Calcium: 8.6 mg/dL — ABNORMAL LOW (ref 8.9–10.3)
Chloride: 107 mmol/L (ref 98–111)
Creatinine, Ser: 2.37 mg/dL — ABNORMAL HIGH (ref 0.44–1.00)
GFR calc Af Amer: 25 mL/min — ABNORMAL LOW (ref >60)
GFR calc non Af Amer: 21 mL/min — ABNORMAL LOW (ref >60)
Glucose, Bld: 79 mg/dL (ref 70–99)
Potassium: 5.5 mmol/L — ABNORMAL HIGH (ref 3.5–5.1)
Sodium: 134 mmol/L — ABNORMAL LOW (ref 135–145)

## 2018-03-24 NOTE — Care Management (Signed)
Contacted [facility] to alert them that the patient in inpatient.

## 2018-03-24 NOTE — Procedures (Signed)
Original request to IR for paracentesis in order to obtain flow cytometry to r/o malignancy prior to possible TIPS procedure. Limited abdominal US was performed which showed scant ascites over midline pelvis not amenable to percutaneous drainage. Discussed with Dr. Vernard Gambles who recommends proceeding with thoracentesis to obtain requested labs given patient with history of hepatic hydrothorax. This was also discussed with patient today who is agreeable to thoracentesis in lieu of paracentesis.   PROCEDURE SUMMARY:  Successful image-guided left thoracentesis. Yielded 1.1 liters of pink milky fluid. Patient tolerated procedure well. EBL: Zero No immediate complications.  Specimen was sent for labs. Post procedure CXR shows no pneumothorax.  Please see imaging section of Epic for full dictation.  Joaquim Nam PA-C 03/24/2018 12:33 PM

## 2018-03-24 NOTE — Care Management Important Message (Signed)
Important Message  Patient Details  Name: Kelly Mclean MRN: [id number] Date of Birth: [birth year]   Medicare Important Message Given:  Yes    Juliann Pulse A Rashad Obeid 03/24/2018, 11:25 AM

## 2018-03-24 NOTE — Progress Notes (Signed)
[place] [facility] [place] NAME: Kelly Mclean    MR#:  [id number]  DATE OF BIRTH:  [birth year]  Patient had thoracentesis, 1.1 L fluid removed.  Did not have much ascites fluid so paracentesis could not be done.  Flow cytometry, cytology, AFB to be sent from thoracentesis fluid.  CHIEF COMPLAINT:   Chief Complaint  Patient presents with  . Shortness of Breath   patient has hepatic hydrothorax.  Has shortness of breath.  Denies any abdominal pain.  REVIEW OF SYSTEMS:    ROS  CONSTITUTIONAL: No documented fever. No fatigue, weakness. No weight gain, no weight loss.  EYES: No blurry or double vision.  ENT: No tinnitus. No postnasal drip. No redness of the oropharynx.  RESPIRATORY: Mild cough, no wheeze, no hemoptysis. mild dyspnea.  CARDIOVASCULAR: No chest pain. No orthopnea. No palpitations. No syncope.  GASTROINTESTINAL: No nausea, no vomiting or diarrhea. No abdominal pain. No melena or hematochezia.  GENITOURINARY: No dysuria or hematuria.  ENDOCRINE: No polyuria or nocturia. No heat or cold intolerance.  HEMATOLOGY: No anemia. No bruising. No bleeding.  INTEGUMENTARY: No rashes. No lesions.  MUSCULOSKELETAL: No arthritis. No swelling. No gout.  NEUROLOGIC: No numbness, tingling, or ataxia. No seizure-type activity.  PSYCHIATRIC: No anxiety. No insomnia. No ADD.   DRUG ALLERGIES:   Allergies  Allergen Reactions  . Biaxin [Clarithromycin] Shortness Of Breath and Rash  . Penicillins Anaphylaxis and Other (See Comments)    Did it involve swelling of the face/tongue/throat, SOB, or low BP? Unknown Did it involve sudden or severe rash/hives, skin peeling, or any reaction on the inside of your mouth or nose? Unknown Did you need to seek medical attention at a hospital or doctor's office? Yes When did it last happen?childhood If all above answers are "NO", may proceed with cephalosporin use.    Marland Kitchen Zoloft [Sertraline Hcl] Other (See  Comments)    Reaction: "made crazy"  . Milk-Related Compounds Diarrhea  . Lactose Intolerance (Gi)     VITALS:  Blood pressure (!) 110/49, pulse 80, temperature 98.2 F (36.8 C), temperature source Oral, resp. rate 18, height 5' 2"  (1.575 m), weight 68.7 kg, SpO2 97 %.  PHYSICAL EXAMINATION:   Physical Exam  GENERAL:  63 y.o.-year-old patient lying in the bed with no acute distress.  EYES: Pupils equal, round, reactive to light  No scleral icterus. Extraocular muscles intact.  HEENT: Head atraumatic, normocephalic. Oropharynx and nasopharynx clear.  NECK:  Supple, no jugular venous distention. No thyroid enlargement, no tenderness.  LUNGS diminished breath sounds bilaterally. CARDIOVASCULAR: S1, S2 normal. No murmurs, rubs, or gallops.  ABDOMEN: Soft, nontender, distended. Bowel sounds present. No organomegaly or mass.  EXTREMITIES: No cyanosis, clubbing or edema b/l.    NEUROLOGIC: Cranial nerves II through XII are intact. No focal Motor or sensory deficits b/l.   PSYCHIATRIC: The patient is alert and oriented x 3.  SKIN: No obvious rash, lesion, or ulcer.   LABORATORY PANEL:   CBC Recent Labs  Lab 03/24/18 0458  WBC 4.3  HGB 8.8*  HCT 27.9*  PLT 89*   ------------------------------------------------------------------------------------------------------------------ Chemistries  Recent Labs  Lab 03/24/18 0458  NA 134*  K 5.5*  CL 107  CO2 24  GLUCOSE 79  BUN 42*  CREATININE 2.37*  CALCIUM 8.6*   ------------------------------------------------------------------------------------------------------------------  Cardiac Enzymes No results for input(s): TROPONINI in the last 168 hours. ------------------------------------------------------------------------------------------------------------------  RADIOLOGY:  Dg Chest Port 1 View  Result Date: 03/24/2018 CLINICAL DATA:  Post left thoracentesis EXAM: PORTABLE CHEST 1 VIEW COMPARISON:  03/21/2018 FINDINGS:  Moderate to large left pleural effusion minimally changed since prior study. No pneumothorax. Left basilar opacity, likely atelectasis. No confluent opacity on the right. Heart is upper limits normal in size. IMPRESSION: Moderate to large-sized left pleural effusion minimally changed. No pneumothorax. Continued left base atelectasis or infiltrate. Electronically Signed   By: Rolm Baptise M.D.   On: 03/24/2018 12:46   US Thoracentesis Asp Pleural Space W/img Guide  Result Date: 03/24/2018 INDICATION: Patient with history of hepatic hydrothorax, cirrhosis and recurrent left pleural effusion who presents today originally for paracentesis in order to obtain flow cytometry to r/o malignancy prior to possible TIPS procedure. Limited abdominal US shows scant peritoneal fluid not amenable to percutaneous drainage. Diagnostic and therapeutic thoracentesis performed in order to obtain requested labs in lieu of paracentesis given patient's history of hepatic hydrothorax. EXAM: ULTRASOUND GUIDED LEFT THORACENTESIS MEDICATIONS: 20 mL 1% lidocaine. COMPLICATIONS: None immediate. PROCEDURE: An ultrasound guided thoracentesis was thoroughly discussed with the patient and questions answered. The benefits, risks, alternatives and complications were also discussed. The patient understands and wishes to proceed with the procedure. Written consent was obtained. Ultrasound was performed to localize and mark an adequate pocket of fluid in the left chest. The area was then prepped and draped in the normal sterile fashion. 1% Lidocaine was used for local anesthesia. Under ultrasound guidance a 6 Fr Safe-T-Centesis catheter was introduced. Thoracentesis was performed. The catheter was removed and a dressing applied. FINDINGS: A total of approximately 1.1 L of pink milky fluid was removed. Samples were sent to the laboratory as requested by the clinical team. IMPRESSION: Successful ultrasound guided left thoracentesis yielding 1.1 L of  pleural fluid. Read by Candiss Norse, PA-C Electronically Signed   By: Lucrezia Europe M.D.   On: 03/24/2018 12:49     ASSESSMENT AND PLAN:   63 year old female patient with history of cirrhosis of liver currently under hospitalist service for dyspnea secondary to pleural effusion   hypoxic respiratory failure secondary to large pleural effusion, patient has recurrent pleural effusion, had lthoracentesis 3 times since this admission, the latest one done this afternoon, left to thoracentesis 1.1 L fluid removed, showed pink milky fluid, spoke with ultrasound to send all labs for flow cytometry ,, cytology, AFB as well.  -Hepatic hydrothorax confirmed, seen by gastroenterology, recommends TIPS procedure but concerned about her lymphocyte count, follow-up with oncology, document recommends paracentesis again for cytology, flow cytometry.  Ordered the paracentesis for tomorrow. Recurrent large left pleural effusion caused by hepatic hydrothorax  patient had QuantiFERON TB test negative.  Status post thoracentesis in this hospitalization twice, patient had third thoracentesis.,  Spoke with ID recommends AFB from fluids,added on  And spoke to ultrasound now.  discontinue ID consult for now,  Pulmonary follow-up appreciated, fluid culture has been negative so far.  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 -History of depression Continue BuSpar and citalopram  -Cirrhosis of liver probably from fatty liver disease, continue Lasix, Aldactone.  No evidence of spontaneous bacterial peritonitis  All the records are reviewed and case discussed with Care Management/Social  Worker. Management plans discussed with the patient, family and they are in  agreement.  CODE STATUS: Full code  DVT Prophylaxis: SCDs  TOTAL TIME TAKING CARE OF THIS PATIENT: 38 minutes.  More than 50% time spent in counseling, coordination of care.  Discussed the case with Dr. Rogue Bussing, ordered paracentesis tomorrow for cytology, flow cytometry.  Discussed with patient also. POSSIBLE D/C IN 2-3 DAYS, DEPENDING ON CLINICAL CONDITION.  Epifanio Lesches M.D on 03/24/2018 at 1:23 PM  Between 7am to 6pm - Pager - [phone]  After 6pm go to [email or web address] - password [email or web address] [facility]  Office  [phone]  CC: Primary care physician; Marnee Guarneri, MD  Note: This dictation was prepared with Dragon dictation along with smaller phrase technology. Any transcriptional errors that result from this process are unintentional.

## 2018-03-24 NOTE — Progress Notes (Signed)
PT Cancellation Note  Patient Details Name: Kelly Mclean MRN: [id number] DOB: 1955/11/11   Cancelled Treatment:    Reason Eval/Treat Not Completed: Medical issues which prohibited therapy. Upon chart review, pt potassium level 5.5 as well as low BP readings. Re attempt when pt medically stable.    Larae Grooms, PTA 03/24/2018, 1:48 PM

## 2018-03-25 DIAGNOSIS — J948 Other specified pleural conditions: Secondary | ICD-10-CM

## 2018-03-25 DIAGNOSIS — R188 Other ascites: Secondary | ICD-10-CM

## 2018-03-25 LAB — KAPPA/LAMBDA LIGHT CHAINS
Kappa free light chain: 115.8 mg/L — ABNORMAL HIGH (ref 3.3–19.4)
Kappa, lambda light chain ratio: 1.22 (ref 0.26–1.65)
Lambda free light chains: 94.9 mg/L — ABNORMAL HIGH (ref 5.7–26.3)

## 2018-03-25 LAB — BASIC METABOLIC PANEL
Anion gap: 5 (ref 5–15)
BUN: 42 mg/dL — ABNORMAL HIGH (ref 8–23)
CO2: 24 mmol/L (ref 22–32)
Calcium: 8.6 mg/dL — ABNORMAL LOW (ref 8.9–10.3)
Chloride: 105 mmol/L (ref 98–111)
Creatinine, Ser: 2.17 mg/dL — ABNORMAL HIGH (ref 0.44–1.00)
GFR calc non Af Amer: 24 mL/min — ABNORMAL LOW (ref >60)
GFR, EST AFRICAN AMERICAN: 28 mL/min — AB (ref >60)
Glucose, Bld: 107 mg/dL — ABNORMAL HIGH (ref 70–99)
Potassium: 5.2 mmol/L — ABNORMAL HIGH (ref 3.5–5.1)
Sodium: 134 mmol/L — ABNORMAL LOW (ref 135–145)

## 2018-03-25 NOTE — Progress Notes (Signed)
Kelly Lame, MD [facility]   [address], [place], [place] [address] Phone: [phone] Fax : [phone]   Subjective: The patient had a thoracentesis yesterday and has no complaints today.  She states she is breathing much better.  Her abdomen does not appear distended.  Patient had 1.1 L of pleural fluid removed.  The patient had imaging showing communication between the abdominal cavity and the pleural space of the lungs consistent with hepatic hydrothorax.  The CT scan from 27 February showed the patient to have moderate amount of ascites.   Objective: Vital signs in last 24 hours: Vitals:   03/24/18 1941 03/25/18 0420 03/25/18 0500 03/25/18 1007  BP: (!) 95/46 (!) 93/44  (!) 102/51  Pulse: 66 68  65  Resp: 18 18  20   Temp: 98.5 F (36.9 C) 98.1 F (36.7 C)  98.4 F (36.9 C)  TempSrc: Oral Oral  Oral  SpO2: 100% 98%  90%  Weight:   69.5 kg   Height:       Weight change:  No intake or output data in the 24 hours ending 03/25/18 1318   Exam: Heart:: Regular rate and rhythm, S1S2 present or without murmur or extra heart sounds Lungs: normal and clear to auscultation and percussion Abdomen: soft, nontender, normal bowel sounds   Lab Results: @LABTEST2 @ Micro Results: Recent Results (from the past 240 hour(s))  Body fluid culture     Status: None   Collection Time: 03/19/18  3:50 PM  Result Value Ref Range Status   Specimen Description   Final    PLEURAL Performed at [facility], [address] [place], [place] [address]    Special Requests   Final    NONE Performed at [facility], [address] [place], [place] [address]    Gram Stain   Final    FEW WBC PRESENT, PREDOMINANTLY MONONUCLEAR NO ORGANISMS SEEN    Culture   Final    NO GROWTH 3 DAYS Performed at [facility], [place] [address] [place], [place] [address]    Report Status 03/23/2018 FINAL  Final  Body fluid culture     Status: None   Collection Time: 03/19/18   4:00 PM  Result Value Ref Range Status   Specimen Description   Final    PERITONEAL Performed at [facility], [address], [place], [place] [address]    Special Requests   Final    NONE Performed at [facility], [address] [place], [place] [address]    Gram Stain   Final    RARE WBC PRESENT, PREDOMINANTLY MONONUCLEAR NO ORGANISMS SEEN    Culture   Final    NO GROWTH 3 DAYS Performed at [facility], [place] [address] [place], [place] [address]    Report Status 03/22/2018 FINAL  Final   Studies/Results: Dg Chest Port 1 View  Result Date: 03/24/2018 CLINICAL DATA:  Post left thoracentesis EXAM: PORTABLE CHEST 1 VIEW COMPARISON:  03/21/2018 FINDINGS: Moderate to large left pleural effusion minimally changed since prior study. No pneumothorax. Left basilar opacity, likely atelectasis. No confluent opacity on the right. Heart is upper limits normal in size. IMPRESSION: Moderate to large-sized left pleural effusion minimally changed. No pneumothorax. Continued left base atelectasis or infiltrate. Electronically Signed   By: Rolm Baptise M.D.   On: 03/24/2018 12:46   US Thoracentesis Asp Pleural Space W/img Guide  Result Date: 03/24/2018 INDICATION: Patient with history of hepatic hydrothorax,  cirrhosis and recurrent left pleural effusion who presents today originally for paracentesis in order to obtain flow cytometry to r/o malignancy prior to possible TIPS procedure. Limited abdominal US shows scant peritoneal fluid not amenable to percutaneous drainage. Diagnostic and therapeutic thoracentesis performed in order to obtain requested labs in lieu of paracentesis given patient's history of hepatic hydrothorax. EXAM: ULTRASOUND GUIDED LEFT THORACENTESIS MEDICATIONS: 20 mL 1% lidocaine. COMPLICATIONS: None immediate. PROCEDURE: An ultrasound guided thoracentesis was thoroughly discussed with the patient and questions answered. The benefits, risks, alternatives and  complications were also discussed. The patient understands and wishes to proceed with the procedure. Written consent was obtained. Ultrasound was performed to localize and mark an adequate pocket of fluid in the left chest. The area was then prepped and draped in the normal sterile fashion. 1% Lidocaine was used for local anesthesia. Under ultrasound guidance a 6 Fr Safe-T-Centesis catheter was introduced. Thoracentesis was performed. The catheter was removed and a dressing applied. FINDINGS: A total of approximately 1.1 L of pink milky fluid was removed. Samples were sent to the laboratory as requested by the clinical team. IMPRESSION: Successful ultrasound guided left thoracentesis yielding 1.1 L of pleural fluid. Read by Candiss Norse, PA-C Electronically Signed   By: Lucrezia Europe M.D.   On: 03/24/2018 12:49   Medications: I have reviewed the patient's current medications. Scheduled Meds: . atenolol  12.5 mg Oral Daily  . busPIRone  10 mg Oral TID  . citalopram  20 mg Oral Daily  . docusate  100 mg Oral BID  . famotidine  20 mg Oral QHS  . feeding supplement  1 Container Oral TID BM  . furosemide  20 mg Oral Daily  . gabapentin  200 mg Oral QHS  . hydrocerin   Topical BID  . montelukast  10 mg Oral QHS  . multivitamin with minerals  1 tablet Oral Daily  . primidone  50 mg Oral BID  . sodium chloride flush  3 mL Intravenous Q12H   Continuous Infusions: PRN Meds:.acetaminophen **OR** acetaminophen, albuterol, HYDROcodone-acetaminophen, ondansetron **OR** ondansetron (ZOFRAN) IV, polyethylene glycol, sodium chloride, sodium chloride flush, trolamine salicylate   Assessment: Active Problems:   Acute respiratory failure (HCC)   Lymphocytosis    Plan: This patient has cirrhosis with ascites and hepatic hydrothorax.  The patient is not amenable to further diuresis and medical therapy due to her creatinine being elevated.  The patient will likely improve with a TIPS procedure.  The  increased lymphocytes in the effusion is being evaluated by both ID and hematology.  Would recommend continued conservative management until a TIPS can be done.   LOS: 11 days   Kelly Mclean 03/25/2018, 1:18 PM

## 2018-03-25 NOTE — Progress Notes (Signed)
PT Cancellation Note  Patient Details Name: Kelly Mclean MRN: [id number] DOB: [birth year]   Cancelled Treatment:    Reason Eval/Treat Not Completed: Medical issues which prohibited therapy. Pt continues to have elevated potassium level, 5.2; contraindicating PT intervention at this time. BP is also trending low at this time. Re attempt when pt medically stable.    Larae Grooms, PTA 03/25/2018, 4:30 PM

## 2018-03-25 NOTE — Progress Notes (Signed)
[facility] [place] NAME: Kelly Mclean    MR#:  [id number]  DATE OF BIRTH:  01/07/1956  Shortness of breath improved.  CHIEF COMPLAINT:   Chief Complaint  Patient presents with  . Shortness of Breath   patient has hepatic hydrothorax.  Has shortness of breath.  Denies any abdominal pain.  REVIEW OF SYSTEMS:    Review of Systems  Constitutional: Negative for chills and fever.  HENT: Negative for hearing loss.   Eyes: Negative for blurred vision, double vision and photophobia.  Respiratory: Negative for cough, hemoptysis and shortness of breath.   Cardiovascular: Negative for palpitations, orthopnea and leg swelling.  Gastrointestinal: Negative for abdominal pain, diarrhea and vomiting.  Genitourinary: Negative for dysuria and urgency.  Musculoskeletal: Negative for myalgias and neck pain.  Skin: Negative for rash.  Neurological: Negative for dizziness, focal weakness, seizures, weakness and headaches.  Psychiatric/Behavioral: Negative for memory loss. The patient does not have insomnia.      DRUG ALLERGIES:   Allergies  Allergen Reactions  . Biaxin [Clarithromycin] Shortness Of Breath and Rash  . Penicillins Anaphylaxis and Other (See Comments)    Did it involve swelling of the face/tongue/throat, SOB, or low BP? Unknown Did it involve sudden or severe rash/hives, skin peeling, or any reaction on the inside of your mouth or nose? Unknown Did you need to seek medical attention at a hospital or doctor's office? Yes When did it last happen?childhood If all above answers are "NO", may proceed with cephalosporin use.    Marland Kitchen Zoloft [Sertraline Hcl] Other (See Comments)    Reaction: "made crazy"  . Milk-Related Compounds Diarrhea  . Lactose Intolerance (Gi)     VITALS:  Blood pressure (!) 102/51, pulse 65, temperature 98.4 F (36.9 C), temperature source Oral, resp. rate 20, height 5' 2"  (1.575 m), weight 69.5 kg, SpO2 90  %.  PHYSICAL EXAMINATION:   Physical Exam  GENERAL:  63 y.o.-year-old patient lying in the bed with no acute distress.  EYES: Pupils equal, round, reactive to light  No scleral icterus. Extraocular muscles intact.  HEENT: Head atraumatic, normocephalic. Oropharynx and nasopharynx clear.  NECK:  Supple, no jugular venous distention. No thyroid enlargement, no tenderness.  LUNGS diminished breath sounds bilaterally. CARDIOVASCULAR: S1, S2 normal. No murmurs, rubs, or gallops.  ABDOMEN: Soft, nontender, distended. Bowel sounds present. No organomegaly or mass.  EXTREMITIES: No cyanosis, clubbing or edema b/l.    NEUROLOGIC: Cranial nerves II through XII are intact. No focal Motor or sensory deficits b/l.   PSYCHIATRIC: The patient is alert and oriented x 3.  SKIN: No obvious rash, lesion, or ulcer.   LABORATORY PANEL:   CBC Recent Labs  Lab 03/24/18 0458  WBC 4.3  HGB 8.8*  HCT 27.9*  PLT 89*   ------------------------------------------------------------------------------------------------------------------ Chemistries  Recent Labs  Lab 03/24/18 0458  NA 134*  K 5.5*  CL 107  CO2 24  GLUCOSE 79  BUN 42*  CREATININE 2.37*  CALCIUM 8.6*   ------------------------------------------------------------------------------------------------------------------  Cardiac Enzymes No results for input(s): TROPONINI in the last 168 hours. ------------------------------------------------------------------------------------------------------------------  RADIOLOGY:  Dg Chest Port 1 View  Result Date: 03/24/2018 CLINICAL DATA:  Post left thoracentesis EXAM: PORTABLE CHEST 1 VIEW COMPARISON:  03/21/2018 FINDINGS: Moderate to large left pleural effusion minimally changed since prior study. No pneumothorax. Left basilar opacity, likely atelectasis. No confluent opacity on the right. Heart is upper limits normal in size. IMPRESSION: Moderate to large-sized left pleural effusion minimally  changed. No pneumothorax. Continued left base atelectasis or infiltrate. Electronically Signed   By: Rolm Baptise M.D.   On: 03/24/2018 12:46   US Thoracentesis Asp Pleural Space W/img Guide  Result Date: 03/24/2018 INDICATION: Patient with history of hepatic hydrothorax, cirrhosis and recurrent left pleural effusion who presents today originally for paracentesis in order to obtain flow cytometry to r/o malignancy prior to possible TIPS procedure. Limited abdominal US shows scant peritoneal fluid not amenable to percutaneous drainage. Diagnostic and therapeutic thoracentesis performed in order to obtain requested labs in lieu of paracentesis given patient's history of hepatic hydrothorax. EXAM: ULTRASOUND GUIDED LEFT THORACENTESIS MEDICATIONS: 20 mL 1% lidocaine. COMPLICATIONS: None immediate. PROCEDURE: An ultrasound guided thoracentesis was thoroughly discussed with the patient and questions answered. The benefits, risks, alternatives and complications were also discussed. The patient understands and wishes to proceed with the procedure. Written consent was obtained. Ultrasound was performed to localize and mark an adequate pocket of fluid in the left chest. The area was then prepped and draped in the normal sterile fashion. 1% Lidocaine was used for local anesthesia. Under ultrasound guidance a 6 Fr Safe-T-Centesis catheter was introduced. Thoracentesis was performed. The catheter was removed and a dressing applied. FINDINGS: A total of approximately 1.1 L of pink milky fluid was removed. Samples were sent to the laboratory as requested by the clinical team. IMPRESSION: Successful ultrasound guided left thoracentesis yielding 1.1 L of pleural fluid. Read by Candiss Norse, PA-C Electronically Signed   By: Lucrezia Europe M.D.   On: 03/24/2018 12:49     ASSESSMENT AND PLAN:   63 year old female patient with history of cirrhosis of liver currently under hospitalist service for dyspnea secondary to pleural  effusion  Hepatic hydrothorax, had repeat paracentesis, thoracentesis on this admission, seen by gastroenterology, patient needs TIPS procedure to prevent recurrent accumulation and treatment of hepatic hydrothorax.     Pulmonary follow-up appreciated, fluid culture has been negative so far.  decompensated liver disease is the cause, patient had QuantiFERON gold test negative, AFB sent from thoracentesis that was done yesterday as per ID recommendation.  echocardiogram : 60 to 65% with normal systolic function  CT abdomen without contrast did not show any acute pathology except ascites, cholelithiasis, enlarged lymph nodes at the pancreatic head and cirrhosis of liver   , pathologist mentioned that lymphocytosis is likely due to reactive as per discussion with Dr. Rogue Bussing. 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 -History of depression Continue BuSpar and citalopram  -Cirrhosis of liver probably from fatty liver disease, continue Lasix, Aldactone.  No evidence of spontaneous bacterial peritonitis, patient has hypotension today, adjust the dose of Lasix, beta-blocker.  Aldactone, lisinopril has been stopped due to hyperkalemia Appreciate gastroenterology following.  Increased lymphocytes in pleural effusion likely reactive but flow cytometry, cytology, AFB are sent from thoracentesis fluid done yesterday. All the records are reviewed and case discussed with Care Management/Social Worker. Management plans discussed with the patient, family and they are in agreement.  CODE STATUS: Full code  DVT Prophylaxis: SCDs  TOTAL TIME TAKING CARE OF THIS PATIENT: 38 minutes.  More than 50% time spent in counseling, coordination of care.  Discussed the case with Dr. Rogue Bussing, ordered paracentesis tomorrow for cytology, flow cytometry.  Discussed with patient also. POSSIBLE D/C IN 2-3 DAYS, DEPENDING ON CLINICAL  CONDITION.  Epifanio Lesches M.D on 03/25/2018 at 1:36 PM  Between 7am to 6pm - Pager - [phone]  After 6pm go to  [email or web address] - password [email or web address] [facility]  Office  [phone]  CC: Primary care physician; Marnee Guarneri, MD  Note: This dictation was prepared with Dragon dictation along with smaller phrase technology. Any transcriptional errors that result from this process are unintentional.

## 2018-03-26 ENCOUNTER — Inpatient Hospital Stay: Payer: Medicare (Managed Care)

## 2018-03-26 LAB — MULTIPLE MYELOMA PANEL, SERUM
Albumin SerPl Elph-Mcnc: 2.7 g/dL — ABNORMAL LOW (ref 2.9–4.4)
Albumin/Glob SerPl: 0.9 (ref 0.7–1.7)
Alpha 1: 0.3 g/dL (ref 0.0–0.4)
Alpha2 Glob SerPl Elph-Mcnc: 0.5 g/dL (ref 0.4–1.0)
B-Globulin SerPl Elph-Mcnc: 1 g/dL (ref 0.7–1.3)
Gamma Glob SerPl Elph-Mcnc: 1.4 g/dL (ref 0.4–1.8)
Globulin, Total: 3.1 g/dL (ref 2.2–3.9)
IgA: 690 mg/dL — ABNORMAL HIGH (ref 87–352)
IgG (Immunoglobin G), Serum: 1489 mg/dL (ref 700–1600)
IgM (Immunoglobulin M), Srm: 64 mg/dL (ref 26–217)
Total Protein ELP: 5.8 g/dL — ABNORMAL LOW (ref 6.0–8.5)

## 2018-03-26 LAB — CBC
HCT: 28.7 % — ABNORMAL LOW (ref 36.0–46.0)
Hemoglobin: 9.1 g/dL — ABNORMAL LOW (ref 12.0–15.0)
MCH: 33.8 pg (ref 26.0–34.0)
MCHC: 31.7 g/dL (ref 30.0–36.0)
MCV: 106.7 fL — ABNORMAL HIGH (ref 80.0–100.0)
Platelets: 86 10*3/uL — ABNORMAL LOW (ref 150–400)
RBC: 2.69 MIL/uL — ABNORMAL LOW (ref 3.87–5.11)
RDW: 14.4 % (ref 11.5–15.5)
WBC: 4.5 10*3/uL (ref 4.0–10.5)
nRBC: 0 % (ref 0.0–0.2)

## 2018-03-26 LAB — COMP PANEL: LEUKEMIA/LYMPHOMA

## 2018-03-26 LAB — COMPREHENSIVE METABOLIC PANEL
ALBUMIN: 2.6 g/dL — AB (ref 3.5–5.0)
ALT: 32 U/L (ref 0–44)
AST: 43 U/L — ABNORMAL HIGH (ref 15–41)
Alkaline Phosphatase: 82 U/L (ref 38–126)
Anion gap: 8 (ref 5–15)
BUN: 39 mg/dL — ABNORMAL HIGH (ref 8–23)
CO2: 23 mmol/L (ref 22–32)
Calcium: 8.9 mg/dL (ref 8.9–10.3)
Chloride: 105 mmol/L (ref 98–111)
Creatinine, Ser: 2.21 mg/dL — ABNORMAL HIGH (ref 0.44–1.00)
GFR calc Af Amer: 27 mL/min — ABNORMAL LOW (ref >60)
GFR calc non Af Amer: 23 mL/min — ABNORMAL LOW (ref >60)
GLUCOSE: 97 mg/dL (ref 70–99)
Potassium: 5.4 mmol/L — ABNORMAL HIGH (ref 3.5–5.1)
Sodium: 136 mmol/L (ref 135–145)
Total Bilirubin: 1.1 mg/dL (ref 0.3–1.2)
Total Protein: 6 g/dL — ABNORMAL LOW (ref 6.5–8.1)

## 2018-03-26 LAB — PROTIME-INR
INR: 1.4 — ABNORMAL HIGH (ref 0.8–1.2)
Prothrombin Time: 17.2 seconds — ABNORMAL HIGH (ref 11.4–15.2)

## 2018-03-26 LAB — AMMONIA: Ammonia: 28 umol/L (ref 9–35)

## 2018-03-26 MED ORDER — ENSURE ENLIVE PO LIQD
237.0000 mL | Freq: Three times a day (TID) | ORAL | Status: DC
  Administered 2018-03-26: 16:00:00 237 mL via ORAL

## 2018-03-26 NOTE — Progress Notes (Signed)
Kelly Darby, MD [address]  [place], [place] [address]  Main: [phone]  Fax: [phone] Pager: [phone]   Subjective: Patient reports worsening shortness of breath and wheezing.  She reports having bad day today.  Trying to eat dinner.  She denies abdominal pain.  But, she thinks her abdomen is getting distended as well.  Her husband is bedside.   Objective: Vital signs in last 24 hours: Vitals:   03/26/18 0447 03/26/18 1416 03/26/18 1553 03/26/18 1907  BP: (!) 112/42  (!) 126/52 (!) 136/59  Pulse: 79 79 79 71  Resp: 18  20 (!) 24  Temp: 98 F (36.7 C)  98.1 F (36.7 C) 97.7 F (36.5 C)  TempSrc: Oral   Oral  SpO2: 97% 100% 100% 100%  Weight:      Height:       Weight change:   Intake/Output Summary (Last 24 hours) at 03/26/2018 1946 Last data filed at 03/26/2018 0908 Gross per 24 hour  Intake 240 ml  Output -  Net 240 ml     Exam: Heart:: Regular rate and rhythm, S1S2 present or without murmur or extra heart sounds Lungs: Decreased breath sounds in the left lower lung base Abdomen: soft, nontender, mildly distended, normal bowel sounds   Lab Results: CBC Latest Ref Rng & Units 03/24/2018 03/21/2018 03/15/2018  WBC 4.0 - 10.5 K/uL 4.3 5.4 3.6(L)  Hemoglobin 12.0 - 15.0 g/dL 8.8(L) 9.2(L) 8.7(L)  Hematocrit 36.0 - 46.0 % 27.9(L) 28.2(L) 25.9(L)  Platelets 150 - 400 K/uL 89(L) 96(L) 78(L)   CMP Latest Ref Rng & Units 03/25/2018 03/24/2018 03/22/2018  Glucose 70 - 99 mg/dL 107(H) 79 88  BUN 8 - 23 mg/dL 42(H) 42(H) 42(H)  Creatinine 0.44 - 1.00 mg/dL 2.17(H) 2.37(H) 2.30(H)  Sodium 135 - 145 mmol/L 134(L) 134(L) 131(L)  Potassium 3.5 - 5.1 mmol/L 5.2(H) 5.5(H) 5.1  Chloride 98 - 111 mmol/L 105 107 104  CO2 22 - 32 mmol/L 24 24 23   Calcium 8.9 - 10.3 mg/dL 8.6(L) 8.6(L) 8.5(L)  Total Protein 6.5 - 8.1 g/dL - - -  Total Bilirubin 0.3 - 1.2 mg/dL - - -  Alkaline Phos 38 - 126 U/L - - -  AST 15 - 41 U/L - - -  ALT 0 - 44 U/L - - -    Micro Results: Recent Results (from the past 240 hour(s))  Body fluid culture     Status: None   Collection Time: 03/19/18  3:50 PM  Result Value Ref Range Status   Specimen Description   Final    PLEURAL Performed at [facility], [address] [place], [place] [address]    Special Requests   Final    NONE Performed at [facility], [address] [place], [place] [address]    Gram Stain   Final    FEW WBC PRESENT, PREDOMINANTLY MONONUCLEAR NO ORGANISMS SEEN    Culture   Final    NO GROWTH 3 DAYS Performed at [facility], [address] [place], [place] [address]    Report Status 03/23/2018 FINAL  Final  Body fluid culture     Status: None   Collection Time: 03/19/18  4:00 PM  Result Value Ref Range Status   Specimen Description   Final    PERITONEAL Performed at [facility], [address] [place],  [address]    Special Requests   Final    NONE Performed  at [facility], [address] [place], [place] [address]    Gram Stain   Final    RARE WBC PRESENT, PREDOMINANTLY MONONUCLEAR NO ORGANISMS SEEN    Culture   Final    NO GROWTH 3 DAYS Performed at [facility] [address], [place], [place] [address]    Report Status 03/22/2018 FINAL  Final   Studies/Results: Dg Chest 1 View  Result Date: 03/26/2018 CLINICAL DATA:  Shortness of breath. EXAM: CHEST  1 VIEW COMPARISON:  Multiple prior exams most recent radiograph 03/24/2018. Most recent CT 03/14/2018 FINDINGS: Known left pleural effusion has increased in size over the past 2 days, now large in size. Associated compressive atelectasis throughout the left lung. Mild rightward mediastinal shift versus rotation. Only a small portion of aerated left perihilar lung. Hypoventilatory right lung without acute finding. No visualized pneumothorax. IMPRESSION: Increased size of known left pleural effusion, now large in size. This causes mild mass effect in the  mediastinum with rightward mediastinal shift. Electronically Signed   By: Keith Rake M.D.   On: 03/26/2018 19:37   Medications:  I have reviewed the patient's current medications. Prior to Admission:  Medications Prior to Admission  Medication Sig Dispense Refill Last Dose  . albuterol (PROVENTIL HFA;VENTOLIN HFA) 108 (90 Base) MCG/ACT inhaler Inhale 2 puffs into the lungs every 6 (six) hours as needed for wheezing or shortness of breath.   03/14/2018 at 1130  . atenolol (TENORMIN) 25 MG tablet Take 12.5 mg by mouth daily.   03/14/2018 at 1100  . busPIRone (BUSPAR) 10 MG tablet Take 10 mg by mouth 3 (three) times daily.   03/14/2018 at 1100  . calcium carbonate (TUMS - DOSED IN MG ELEMENTAL CALCIUM) 500 MG chewable tablet Chew 2 tablets by mouth 2 (two) times daily.   03/14/2018 at 1100  . citalopram (CELEXA) 20 MG tablet Take 20 mg by mouth daily.   03/14/2018 at 1100  . docusate sodium (COLACE) 100 MG capsule Take 100 mg by mouth 2 (two) times daily.   03/14/2018 at 1100  . famotidine (PEPCID) 40 MG tablet Take 40 mg by mouth at bedtime.   03/13/2018 at 2200  . gabapentin (NEURONTIN) 100 MG capsule Take 200 mg by mouth at bedtime.   03/13/2018 at 2200  . lisinopril (PRINIVIL,ZESTRIL) 2.5 MG tablet Take 2.5 mg by mouth at bedtime.   03/13/2018 at 2200  . montelukast (SINGULAIR) 10 MG tablet Take 10 mg by mouth at bedtime.   03/13/2018 at 2200  . primidone (MYSOLINE) 50 MG tablet Take 50 mg by mouth 2 (two) times daily.   03/14/2018 at 1100  . lidocaine (LIDODERM) 5 % Place 1 patch onto the skin every 12 (twelve) hours. Remove & Discard patch within 12 hours or as directed by MD 10 patch 2 prn at prn   Scheduled: . atenolol  12.5 mg Oral Daily  . busPIRone  10 mg Oral TID  . citalopram  20 mg Oral Daily  . docusate  100 mg Oral BID  . famotidine  20 mg Oral QHS  . feeding supplement (ENSURE ENLIVE)  237 mL Oral TID BM  . furosemide  20 mg Oral Daily  . gabapentin  200 mg Oral QHS  .  hydrocerin   Topical BID  . montelukast  10 mg Oral QHS  . multivitamin with minerals  1 tablet Oral Daily  . primidone  50 mg Oral BID  . sodium chloride flush  3 mL  Intravenous Q12H   Continuous:  [id number] **OR** acetaminophen, albuterol, HYDROcodone-acetaminophen, ondansetron **OR** ondansetron (ZOFRAN) IV, polyethylene glycol, sodium chloride, sodium chloride flush, trolamine salicylate Anti-infectives (From admission, onward)   None     Scheduled Meds: . atenolol  12.5 mg Oral Daily  . busPIRone  10 mg Oral TID  . citalopram  20 mg Oral Daily  . docusate  100 mg Oral BID  . famotidine  20 mg Oral QHS  . feeding supplement (ENSURE ENLIVE)  237 mL Oral TID BM  . furosemide  20 mg Oral Daily  . gabapentin  200 mg Oral QHS  . hydrocerin   Topical BID  . montelukast  10 mg Oral QHS  . multivitamin with minerals  1 tablet Oral Daily  . primidone  50 mg Oral BID  . 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 Vianne Bulls is notified Personally spoke to Dr. Sandi Mariscal, interventional radiologist  today over phone and discussed about her case for TIPS evaluation.  TIPS is only performed at [facility] and patient needs to be transferred there for TIPS placement Her meld- sodium is 23 based on recent labs, will update the score tomorrow Echocardiogram from 02/2018 revealed normal LV function, no valvular abnormalities, with no evidence of pulmonary hypertension  Likely transfer to [facility] tomorrow Discussed my recommendations with Dr. Vianne Bulls   LOS: 12 days   Tailer Volkert 03/26/2018, 7:46 PM

## 2018-03-26 NOTE — Progress Notes (Signed)
Discussed with Dr. Vianne Bulls after her conversation with GI Dr. Marius Ditch.  Patient seen and examined. Laying in bed flat and comfortable.  Her breathing has improved after breathing treatment and feels back to baseline.  On 2 L oxygen.  S1, S2 Lungs with air entry bilaterally.  Decreased at left base Abdomen distended  *Recurrent left pleural effusion.  Status post multiple thoracentesis.  At this time patient has worsening pleural effusion of the left side on chest x-ray.  But she seems comfortable with her respiratory status.  Will need a scheduled thoracentesis in the morning.  Plan is to transfer her for TIPS to [facility] per GI.  Thoracentesis can be done at the same time.  Discussed regarding her ascites, thoracentesis, tips, GI recommendations at length.  Husband would like to discuss further with GI team regarding treatment plan.  Updated patient, husband at bedside.  Time spent 35 minutes

## 2018-03-26 NOTE — Progress Notes (Signed)
[facility] [place] NAME: Kelly Mclean    MR#:  [id number]  DATE OF BIRTH:  July 13, 1955  Patient complains of worsening shortness of breath today, no chest pain, no abdominal pain.  CHIEF COMPLAINT:   Chief Complaint  Patient presents with  . Shortness of Breath   patient has hepatic hydrothorax.  Has shortness of breath.  Denies any abdominal pain.  REVIEW OF SYSTEMS:    Review of Systems  Constitutional: Negative for chills and fever.  HENT: Negative for hearing loss.   Eyes: Negative for blurred vision, double vision and photophobia.  Respiratory: Negative for cough, hemoptysis and shortness of breath.   Cardiovascular: Negative for palpitations, orthopnea and leg swelling.  Gastrointestinal: Negative for abdominal pain, diarrhea and vomiting.  Genitourinary: Negative for dysuria and urgency.  Musculoskeletal: Negative for myalgias and neck pain.  Skin: Negative for rash.  Neurological: Negative for dizziness, focal weakness, seizures, weakness and headaches.  Psychiatric/Behavioral: Negative for memory loss. The patient does not have insomnia.      DRUG ALLERGIES:   Allergies  Allergen Reactions  . Biaxin [Clarithromycin] Shortness Of Breath and Rash  . Penicillins Anaphylaxis and Other (See Comments)    Did it involve swelling of the face/tongue/throat, SOB, or low BP? Unknown Did it involve sudden or severe rash/hives, skin peeling, or any reaction on the inside of your mouth or nose? Unknown Did you need to seek medical attention at a hospital or doctor's office? Yes When did it last happen?childhood If all above answers are "NO", may proceed with cephalosporin use.    Kelly Mclean Kitchen Zoloft [Sertraline Hcl] Other (See Comments)    Reaction: "made crazy"  . Milk-Related Compounds Diarrhea  . Lactose Intolerance (Gi)     VITALS:  Blood pressure (!) 112/42, pulse 79, temperature 98 F (36.7 C), temperature source Oral, resp.  rate 18, height 5' 2"  (1.575 m), weight 69.5 kg, SpO2 97 %.  PHYSICAL EXAMINATION:   Physical Exam  GENERAL:  63 y.o.-year-old patient lying in the bed with no acute distress.  EYES: Pupils equal, round, reactive to light  No scleral icterus. Extraocular muscles intact.  HEENT: Head atraumatic, normocephalic. Oropharynx and nasopharynx clear.  NECK:  Supple, no jugular venous distention. No thyroid enlargement, no tenderness.  LUNGS diminished breath sounds bilaterally. CARDIOVASCULAR: S1, S2 normal. No murmurs, rubs, or gallops.  ABDOMEN: Soft, nontender, distended. Bowel sounds present. No organomegaly or mass.  EXTREMITIES: No cyanosis, clubbing or edema b/l.    NEUROLOGIC: Cranial nerves II through XII are intact. No focal Motor or sensory deficits b/l.   PSYCHIATRIC: The patient is alert and oriented x 3.  SKIN: No obvious rash, lesion, or ulcer.   LABORATORY PANEL:   CBC Recent Labs  Lab 03/24/18 0458  WBC 4.3  HGB 8.8*  HCT 27.9*  PLT 89*   ------------------------------------------------------------------------------------------------------------------ Chemistries  Recent Labs  Lab 03/25/18 1422  NA 134*  K 5.2*  CL 105  CO2 24  GLUCOSE 107*  BUN 42*  CREATININE 2.17*  CALCIUM 8.6*   ------------------------------------------------------------------------------------------------------------------  Cardiac Enzymes No results for input(s): TROPONINI in the last 168 hours. ------------------------------------------------------------------------------------------------------------------  RADIOLOGY:  Dg Chest Port 1 View  Result Date: 03/24/2018 CLINICAL DATA:  Post left thoracentesis EXAM: PORTABLE CHEST 1 VIEW COMPARISON:  03/21/2018 FINDINGS: Moderate to large left pleural effusion minimally changed since prior study. No pneumothorax. Left basilar opacity, likely atelectasis. No confluent opacity on the right. Heart is upper limits normal in  size. IMPRESSION:  Moderate to large-sized left pleural effusion minimally changed. No pneumothorax. Continued left base atelectasis or infiltrate. Electronically Signed   By: Rolm Baptise M.D.   On: 03/24/2018 12:46   US Thoracentesis Asp Pleural Space W/img Guide  Result Date: 03/24/2018 INDICATION: Patient with history of hepatic hydrothorax, cirrhosis and recurrent left pleural effusion who presents today originally for paracentesis in order to obtain flow cytometry to r/o malignancy prior to possible TIPS procedure. Limited abdominal US shows scant peritoneal fluid not amenable to percutaneous drainage. Diagnostic and therapeutic thoracentesis performed in order to obtain requested labs in lieu of paracentesis given patient's history of hepatic hydrothorax. EXAM: ULTRASOUND GUIDED LEFT THORACENTESIS MEDICATIONS: 20 mL 1% lidocaine. COMPLICATIONS: None immediate. PROCEDURE: An ultrasound guided thoracentesis was thoroughly discussed with the patient and questions answered. The benefits, risks, alternatives and complications were also discussed. The patient understands and wishes to proceed with the procedure. Written consent was obtained. Ultrasound was performed to localize and mark an adequate pocket of fluid in the left chest. The area was then prepped and draped in the normal sterile fashion. 1% Lidocaine was used for local anesthesia. Under ultrasound guidance a 6 Fr Safe-T-Centesis catheter was introduced. Thoracentesis was performed. The catheter was removed and a dressing applied. FINDINGS: A total of approximately 1.1 L of pink milky fluid was removed. Samples were sent to the laboratory as requested by the clinical team. IMPRESSION: Successful ultrasound guided left thoracentesis yielding 1.1 L of pleural fluid. Read by Candiss Norse, PA-C Electronically Signed   By: Lucrezia Europe M.D.   On: 03/24/2018 12:49     ASSESSMENT AND PLAN:   63 year old female patient with history of cirrhosis of liver currently under  hospitalist service for dyspnea secondary to pleural effusion  Hepatic hydrothorax, had repeat paracentesis, thoracentesis on this admission, seen by gastroenterology, patient needs TIPS procedure to prevent recurrent accumulation and treatment of hepatic hydrothorax.     Pulmonary follow-up appreciated, fluid culture has been negative so far.  decompensated liver disease is the cause, patient had QuantiFERON gold test negative, AFB sent from thoracentesis that was done yesterday as per ID recommendation.  echocardiogram : 60 to 65% with normal systolic function Status post thoracocentesis on Monday, 1 L of fluid removed, pleural fluid sent for cytology, negative for malignancy reactive mesothelial cells admixed with inflammation waiting for flow cytometry results.. CT abdomen without contrast did not show any acute pathology except ascites, cholelithiasis, enlarged lymph nodes at the pancreatic head and cirrhosis of liver   , pathologist mentioned that lymphocytosis is likely due to reactive as per discussion with Dr. Rogue Bussing. 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 -History of depression Continue BuSpar and citalopram  -Cirrhosis of liver probably from fatty liver disease, continue Lasix, Aldactone.  No evidence of spontaneous bacterial peritonitis, patient has hypotension today, adjust the dose of Lasix, beta-blocker.  Aldactone, lisinopril has been stopped due to hyperkalemia Appreciate gastroenterology following.  Increased lymphocytes in pleural effusion likely reactive but flow cytometry, cytology, AFB are sent from thoracentesis fluid done yesterday.  Hyperkalemia improved.   All the records are reviewed and case discussed with Care Management/Social Worker. Management plans discussed with the patient, family and they are in agreement.  CODE STATUS: Full code  DVT Prophylaxis: SCDs  TOTAL TIME TAKING CARE OF  THIS PATIENT: 38 minutes.  More than 50% time spent in counseling, coordination of care.  Discussed the case with Dr. Rogue Bussing, ordered  paracentesis tomorrow for cytology, flow cytometry.  Discussed with patient also. POSSIBLE D/C IN 2-3 DAYS, DEPENDING ON CLINICAL CONDITION.  Epifanio Lesches M.D on 03/26/2018 at 12:13 PM  Between 7am to 6pm - Pager - [phone]  After 6pm go to [email or web address] - password [email or web address] [facility]  Office  [phone]  CC: Primary care physician; Marnee Guarneri, MD  Note: This dictation was prepared with Dragon dictation along with smaller phrase technology. Any transcriptional errors that result from this process are unintentional.

## 2018-03-26 NOTE — Progress Notes (Signed)
Nutrition Follow-up  DOCUMENTATION CODES:   Not applicable  INTERVENTION:   - Ensure Enlive po TID, each supplement provides 350 kcal and 20 grams of protein  - Double protein portions with meals  - d/c Boost Breeze per pt request  - Continue MVI with minerals daily  NUTRITION DIAGNOSIS:   Inadequate oral intake related to decreased appetite, nausea as evidenced by per patient/family report.  Progressing  GOAL:   Patient will meet greater than or equal to 90% of their needs  Progressing  MONITOR:   PO intake, Supplement acceptance, Labs, Weight trends, I & O's  REASON FOR ASSESSMENT:   Malnutrition Screening Tool    ASSESSMENT:   63 year old female with PMHx of asthma, anemia, HTN, cirrhosis admitted with large recurrent pleural effusion s/p US-guided thoracentesis on 2/21 where 2.3 L fluid was removed.   Hepatic hydrothorax confirmed.  2/26 - s/p thoracentesis with 1.1 L output 3/2 - s/p thoracentesis with 1.1 L output  Gastroenterology recommending TIPS procedure once effusion is evaluated by ID and hematology. Currently waiting for flow cytometry results from pleural fluid which was negative for malignancy.  Weight down a total of 4 lbs since admission. Suspect weight loss related to fluid given thoracentesis x 3 and negative fluid balance.  Spoke with pt at bedside. Pt reports she is feeling very tired today. Pt shares that she does not like the Boost Breeze oral nutrition supplements and is willing to try Ensure Enlive. RD explained milky consistency but that the supplements are suitable for lactose intolerance. Pt reports she would like to try them.  Pt states that she is having difficulty finding things to eat. Pt states she is gluten-free, dairy-free, and has to "watch my sugars." Pt states that she does like the [place] fries and sausage patties. RD to order double protein portions with meals. RD encouraged adequate PO intake and educated on the importance  of adequate nutrition.  Meal Completion: 0-100% x 7 recorded meals  Medications reviewed and include: Colace, Pepcid, Boost Breeze TID (pt accepting ~50%), Lasix, MVI with minerals  Labs reviewed: sodium 134 (L), potassium 5.2 (H), BUN 42 (H), creatinine 2.17 (H), hemoglobin 8.8 (L)  I/O's: -2.5 L since admit  Diet Order:   Diet Order            Diet regular Room service appropriate? Yes; Fluid consistency: Thin  Diet effective now              EDUCATION NEEDS:   Education needs have been addressed  Skin:  Skin Assessment: Reviewed RN Assessment (incision to L back)  Last BM:  3/4 small type 5  Height:   Ht Readings from Last 1 Encounters:  03/22/18 5' 2"  (1.575 m)    Weight:   Wt Readings from Last 1 Encounters:  03/25/18 69.5 kg    Ideal Body Weight:  50 kg  BMI:  Body mass index is 28.04 kg/m.  Estimated Nutritional Needs:   Kcal:  2376-2831  Protein:  85-95 grams  Fluid:  1.7 L/day    Gaynell Face, MS, RD, LDN Inpatient Clinical Dietitian Pager: [phone] Weekend/After Hours: [phone]

## 2018-03-26 NOTE — Progress Notes (Signed)
Physical Therapy Treatment Patient Details Name: Kelly Mclean MRN: [id number] DOB: 1955/04/14 Today's Date: 03/26/2018    History of Present Illness Pt is a 63 y.o. female presenting to hospital 03/14/18 with SOB.  Pt admitted with acute hypoxic respiratory failure in setting of large L pleural effusion.  Pt s/p thoracentesis 03/14/18 (2.3 L yield of pleural fluid).  Pt part of P[facility] program.  PMH includes anemia, asthma, cirrhosis, htn, and recurrent pleural effusion.    PT Comments    Spoke with nursing who states pt appropriate for PT this date. Pt agreeable to PT; denies pain. Reports + fatigue and shortness of breath often. O2 sats on 2L supplemental O2 100% at start of session. Pt performs supine bed exercises mostly active range of motion with rest breaks between sets to manage exertion and breathing per education to work at a 3-4 intensity level on a 0-10 scale; 10 being the most. O2 sats decrease to 98% at lowest. Pt declines out of bed this session. Continue to progress strength, endurance to improve all functional mobility and allow for out of bed tolerance.   Follow Up Recommendations  Home health PT;Other (comment)(pt must be able to demonstrate walking otherwise SNF)     Equipment Recommendations       Recommendations for Other Services       Precautions / Restrictions Precautions Precautions: Fall Restrictions Weight Bearing Restrictions: No    Mobility  Bed Mobility               General bed mobility comments: Declines today  Transfers                    Ambulation/Gait                 Stairs             Wheelchair Mobility    Modified Rankin (Stroke Patients Only)       Balance                                            Cognition Arousal/Alertness: Awake/alert Behavior During Therapy: WFL for tasks assessed/performed Overall Cognitive Status: Within Functional Limits for tasks assessed                                  General Comments: Reports fatigue and easily becomes SOB      Exercises General Exercises - Lower Extremity Ankle Circles/Pumps: AROM;Both;20 reps Quad Sets: Strengthening;Both;10 reps;Other (comment)(2 sets) Gluteal Sets: Strengthening;Both;10 reps;Other (comment)(2 sets) Short Arc Quad: AROM;Both;10 reps;Other (comment)(2 sets) Heel Slides: AROM;Both;10 reps;Other (comment)(2 sets) Hip ABduction/ADduction: AROM;Both;10 reps;Other (comment)(2 sets) Straight Leg Raises: AAROM;Both;10 reps;Other (comment)(2 sets) Other Exercises Other Exercises: education regarding conservative exertion performing exercises in sets, managing breath during exercise and rest periods    General Comments        Pertinent Vitals/Pain Pain Assessment: No/denies pain    Home Living                      Prior Function            PT Goals (current goals can now be found in the care plan section) Progress towards PT goals: Progressing toward goals(slowly)    Frequency    Min 2X/week  PT Plan Current plan remains appropriate    Co-evaluation              AM-PAC PT "6 Clicks" Mobility   Outcome Measure  Help needed turning from your back to your side while in a flat bed without using bedrails?: A Little Help needed moving from lying on your back to sitting on the side of a flat bed without using bedrails?: A Little Help needed moving to and from a bed to a chair (including a wheelchair)?: A Little Help needed standing up from a chair using your arms (e.g., wheelchair or bedside chair)?: A Little Help needed to walk in hospital room?: A Little Help needed climbing 3-5 steps with a railing? : A Lot 6 Click Score: 17    End of Session   Activity Tolerance: Patient limited by fatigue Patient left: in bed;with call bell/phone within reach;with bed alarm set   PT Visit Diagnosis: Muscle weakness (generalized) (M62.81)     Time:  6578-4696 PT Time Calculation (min) (ACUTE ONLY): 29 min  Charges:  $Therapeutic Exercise: 23-37 mins                      Larae Grooms, PTA 03/26/2018, 3:04 PM

## 2018-03-27 ENCOUNTER — Inpatient Hospital Stay: Payer: Medicare (Managed Care)

## 2018-03-27 LAB — CYTOLOGY - NON PAP

## 2018-03-27 LAB — CD19 AND CD20, FLOW CYTOMETRY
% CD19: 16 %
% CD20: 16 %

## 2018-03-27 MED ORDER — ALBUTEROL SULFATE (2.5 MG/3ML) 0.083% IN NEBU: 2.5000 mg | INHALATION_SOLUTION | Freq: Four times a day (QID) | RESPIRATORY_TRACT | 12 refills | Status: AC | PRN

## 2018-03-27 MED ORDER — FUROSEMIDE 20 MG PO TABS: 20.0000 mg | ORAL_TABLET | Freq: Every day | ORAL | 0 refills | Status: DC

## 2018-03-27 NOTE — Progress Notes (Addendum)
Kelly Darby, MD [address]  [place]  [place], [place] [address]  Main: [phone]  Fax: [phone] Pager: [phone]   Subjective: Patient underwent therapeutic thoracentesis today, 2.2 L of milky white fluid was removed.  She feels much comfortable today and able to speak in full sentences without feeling short of breath   Objective: Vital signs in last 24 hours: Vitals:   03/27/18 0926 03/27/18 1149 03/27/18 1220 03/27/18 1432  BP: (!) 113/57 (!) 112/49 (!) 102/50 (!) 103/43  Pulse: 72 68 73 63  Resp: 18 19  20   Temp: 98.5 F (36.9 C)   98.4 F (36.9 C)  TempSrc: Oral   Oral  SpO2: 99% 98% 99% 100%  Weight:      Height:       Weight change:  No intake or output data in the 24 hours ending 03/27/18 1811   Exam: Heart:: Regular rate and rhythm, S1S2 present or without murmur or extra heart sounds Lungs: Decreased breath sounds in the left lower lung base Abdomen: soft, nontender, mildly distended, normal bowel sounds   Lab Results: CBC Latest Ref Rng & Units 03/26/2018 03/24/2018 03/21/2018  WBC 4.0 - 10.5 K/uL 4.5 4.3 5.4  Hemoglobin 12.0 - 15.0 g/dL 9.1(L) 8.8(L) 9.2(L)  Hematocrit 36.0 - 46.0 % 28.7(L) 27.9(L) 28.2(L)  Platelets 150 - 400 K/uL 86(L) 89(L) 96(L)   CMP Latest Ref Rng & Units 03/26/2018 03/25/2018 03/24/2018  Glucose 70 - 99 mg/dL 97 107(H) 79  BUN 8 - 23 mg/dL 39(H) 42(H) 42(H)  Creatinine 0.44 - 1.00 mg/dL 2.21(H) 2.17(H) 2.37(H)  Sodium 135 - 145 mmol/L 136 134(L) 134(L)  Potassium 3.5 - 5.1 mmol/L 5.4(H) 5.2(H) 5.5(H)  Chloride 98 - 111 mmol/L 105 105 107  CO2 22 - 32 mmol/L 23 24 24   Calcium 8.9 - 10.3 mg/dL 8.9 8.6(L) 8.6(L)  Total Protein 6.5 - 8.1 g/dL 6.0(L) - -  Total Bilirubin 0.3 - 1.2 mg/dL 1.1 - -  Alkaline Phos 38 - 126 U/L 82 - -  AST 15 - 41 U/L 43(H) - -  ALT 0 - 44 U/L 32 - -   Micro Results: Recent Results (from the past 240 hour(s))  Body fluid culture     Status: None   Collection Time: 03/19/18  3:50 PM   Result Value Ref Range Status   Specimen Description   Final    PLEURAL Performed at [facility], [address] [place], [place] [address]    Special Requests   Final    NONE Performed at [facility], [address] [place], [place] [address]    Gram Stain   Final    FEW WBC PRESENT, PREDOMINANTLY MONONUCLEAR NO ORGANISMS SEEN    Culture   Final    NO GROWTH 3 DAYS Performed at [facility], [place] [address], [place], [place] [address]    Report Status 03/23/2018 FINAL  Final  Body fluid culture     Status: None   Collection Time: 03/19/18  4:00 PM  Result Value Ref Range Status   Specimen Description   Final    PERITONEAL Performed at [facility], [address], [place], [place] [address]    Special Requests   Final    NONE Performed at [facility], [place],  [address]    Gram Stain   Final    RARE WBC PRESENT, PREDOMINANTLY MONONUCLEAR NO ORGANISMS SEEN  Culture   Final    NO GROWTH 3 DAYS Performed at [facility], [place] [address] [place], [place] [address]    Report Status 03/22/2018 FINAL  Final   Studies/Results: Dg Chest 1 View  Result Date: 03/26/2018 CLINICAL DATA:  Shortness of breath. EXAM: CHEST  1 VIEW COMPARISON:  Multiple prior exams most recent radiograph 03/24/2018. Most recent CT 03/14/2018 FINDINGS: Known left pleural effusion has increased in size over the past 2 days, now large in size. Associated compressive atelectasis throughout the left lung. Mild rightward mediastinal shift versus rotation. Only a small portion of aerated left perihilar lung. Hypoventilatory right lung without acute finding. No visualized pneumothorax. IMPRESSION: Increased size of known left pleural effusion, now large in size. This causes mild mass effect in the mediastinum with rightward mediastinal shift. Electronically Signed   By: Keith Rake M.D.   On: 03/26/2018 19:37   Dg Chest Port 1  View  Result Date: 03/27/2018 CLINICAL DATA:  History of hepatic hydrothorax post repeat large volume left-sided thoracentesis. EXAM: PORTABLE CHEST 1 VIEW COMPARISON:  Chest radiograph-03/26/2018 FINDINGS: Grossly unchanged cardiac silhouette and mediastinal contours. Interval reduction in persistent moderate size left-sided effusion post thoracentesis. No pneumothorax. Improved aeration of the left mid and lower lung with persistent left basilar heterogeneous/consolidative opacities. No new focal airspace opacities. No definite right-sided pleural effusion. No evidence of edema. No acute osseous abnormalities. IMPRESSION: 1. Interval reduction in persistent moderate size left-sided effusion post thoracentesis. No pneumothorax. 2. Improved aeration of the left lung base with persistent left basilar atelectasis. Electronically Signed   By: Sandi Mariscal M.D.   On: 03/27/2018 13:34   US Thoracentesis Asp Pleural Space W/img Guide  Result Date: 03/27/2018 INDICATION: History of left-sided hepatic hydrothorax now with recurrent symptomatic left-sided pleural effusion. Please perform ultrasound-guided thoracentesis for therapeutic purposes. EXAM: US THORACENTESIS ASP PLEURAL SPACE W/IMG GUIDE COMPARISON:  Chest radiograph-03/26/2018 MEDICATIONS: None. COMPLICATIONS: None immediate. TECHNIQUE: Informed written consent was obtained from the patient after a discussion of the risks, benefits and alternatives to treatment. A timeout was performed prior to the initiation of the procedure. Initial ultrasound scanning demonstrates a large minimally complex though predominantly anechoic left-sided pleural effusion. The lower chest was prepped and draped in the usual sterile fashion. 1% lidocaine was used for local anesthesia. An ultrasound image was saved for documentation purposes. An 8 Fr Safe-T-Centesis catheter was introduced. The thoracentesis was performed. The catheter was removed and a dressing was applied. The patient  tolerated the procedure well without immediate post procedural complication. The patient was escorted to have an upright chest radiograph. FINDINGS: A total of approximately 2.2 liters of serous fluid was removed. IMPRESSION: Successful ultrasound-guided left sided thoracentesis yielding 2.2 liters of pleural fluid. Electronically Signed   By: Sandi Mariscal M.D.   On: 03/27/2018 13:37   Medications:  I have reviewed the patient's current medications. Prior to Admission:  Medications Prior to Admission  Medication Sig Dispense Refill Last Dose  . albuterol (PROVENTIL HFA;VENTOLIN HFA) 108 (90 Base) MCG/ACT inhaler Inhale 2 puffs into the lungs every 6 (six) hours as needed for wheezing or shortness of breath.   03/14/2018 at 1130  . atenolol (TENORMIN) 25 MG tablet Take 12.5 mg by mouth daily.   03/14/2018 at 1100  . busPIRone (BUSPAR) 10 MG tablet Take 10 mg by mouth 3 (three) times daily.   03/14/2018 at 1100  . calcium carbonate (TUMS - DOSED IN MG ELEMENTAL CALCIUM) 500 MG chewable tablet Chew  2 tablets by mouth 2 (two) times daily.   03/14/2018 at 1100  . citalopram (CELEXA) 20 MG tablet Take 20 mg by mouth daily.   03/14/2018 at 1100  . docusate sodium (COLACE) 100 MG capsule Take 100 mg by mouth 2 (two) times daily.   03/14/2018 at 1100  . famotidine (PEPCID) 40 MG tablet Take 40 mg by mouth at bedtime.   03/13/2018 at 2200  . gabapentin (NEURONTIN) 100 MG capsule Take 200 mg by mouth at bedtime.   03/13/2018 at 2200  . lisinopril (PRINIVIL,ZESTRIL) 2.5 MG tablet Take 2.5 mg by mouth at bedtime.   03/13/2018 at 2200  . montelukast (SINGULAIR) 10 MG tablet Take 10 mg by mouth at bedtime.   03/13/2018 at 2200  . primidone (MYSOLINE) 50 MG tablet Take 50 mg by mouth 2 (two) times daily.   03/14/2018 at 1100  . lidocaine (LIDODERM) 5 % Place 1 patch onto the skin every 12 (twelve) hours. Remove & Discard patch within 12 hours or as directed by MD 10 patch 2 prn at prn   Scheduled: . atenolol  12.5 mg Oral  Daily  . busPIRone  10 mg Oral TID  . citalopram  20 mg Oral Daily  . docusate  100 mg Oral BID  . famotidine  20 mg Oral QHS  . feeding supplement (ENSURE ENLIVE)  237 mL Oral TID BM  . furosemide  20 mg Oral Daily  . gabapentin  200 mg Oral QHS  . hydrocerin   Topical BID  . montelukast  10 mg Oral QHS  . multivitamin with minerals  1 tablet Oral Daily  . primidone  50 mg Oral BID  . sodium chloride flush  3 mL Intravenous Q12H   Continuous:  JKD:[id number] **OR** acetaminophen, albuterol, HYDROcodone-acetaminophen, ondansetron **OR** ondansetron (ZOFRAN) IV, polyethylene glycol, sodium chloride, sodium chloride flush, trolamine salicylate Anti-infectives (From admission, onward)   None     Scheduled Meds: . atenolol  12.5 mg Oral Daily  . busPIRone  10 mg Oral TID  . citalopram  20 mg Oral Daily  . docusate  100 mg Oral BID  . famotidine  20 mg Oral QHS  . feeding supplement (ENSURE ENLIVE)  237 mL Oral TID BM  . furosemide  20 mg Oral Daily  . gabapentin  200 mg Oral QHS  . hydrocerin   Topical BID  . montelukast  10 mg Oral QHS  . multivitamin with minerals  1 tablet Oral Daily  . primidone  50 mg Oral BID  . 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 Sandi Mariscal, interventional radiologist today over phone and discussed about her case for TIPS evaluation.  TIPS is only performed at [facility] and patient needs to be transferred there for TIPS placement, awaiting transfer Her MELD-Na is low Echocardiogram from 02/2018 revealed normal LV function, no valvular abnormalities, with no evidence of pulmonary hypertension  Decompensated cirrhosis  Patient will need to have a close follow-up with GI/hepatology after discharge  GI will sign off at this time, please call us back with questions or concerns   LOS: 13 days   Maggie Dworkin 03/27/2018, 6:11 PM

## 2018-03-27 NOTE — Procedures (Signed)
Pre procedural Dx: Symptomatic Pleural effusion Post procedural Dx: Same  Successful US guided left sided thoracentesis yielding 2.2 L of chylous appearing pleural fluid.    EBL: None  Complications: None immediate.  Ronny Bacon, MD Pager #: [phone]

## 2018-03-27 NOTE — Discharge Summary (Signed)
Kelly Mclean, is a 63 y.o. female  DOB October 25, 1955  MRN [id number].  Admission date:  03/14/2018  Admitting Physician  Bettey Costa, MD  Discharge Date:  03/27/2018   Primary MD  Marnee Guarneri, MD  Recommendations for primary care physician for things to follow:   Transferred to [facility] for TIPS procedure   Admission Diagnosis  Dyspnea [R06.00] Pleural effusion [J90] Hypoxia [R09.02] Status post thoracentesis [Z98.890]   Discharge Diagnosis  Dyspnea [R06.00] Pleural effusion [J90] Hypoxia [R09.02] Status post thoracentesis [Z98.890]    Active Problems:   Acute respiratory failure ([place])   Lymphocytosis   Ascites   Hydrothorax      Past Medical History:  Diagnosis Date  . Anemia   . Asthma   . Cirrhosis ([place])   . Hypertension   . Pleural effusion     Past Surgical History:  Procedure Laterality Date  . TUBAL LIGATION         History of present illness and  Hospital Course:     Kindly see H&P for history of present illness and admission details, please review complete Labs, Consult reports and Test reports for all details in brief  HPI  from the history and physical done on the day of admission 63 year old female patient with history of liver cirrhosis admitted because of shortness of breath.  Patient has liver cirrhosis, history of alcohol abuse before.  Patient admitted on February 21.      Hospital Course  Acute respiratory failure with recurrent pleural effusion, patient is on 2 L of oxygen, patient found to have large pleural effusion, had thoracocentesis 3 times this hospitalization on February 21, February 26, March 2, patient had large left left pleural effusion, patient had so far 5.6 L of fluid drained from left pleural effusion since admission, patient still complains of  shortness of breath, we repeated the chest x-ray last night showed worsening pleural effusion with compressive atelectasis, patient oxygen saturation 99% on 2 L but she feels short of breath.  Has recurrent pleural effusion, hepatic hydrothorax thorax, needs to TIPS procedure, seen by gastroenterology, recommends transfer to [facility] for TIPS procedure. 2.  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  Spoke with Dr. Rob Hickman ,[facility], who [facility] the patient. 3.  Regarding shortness of breath she had echocardiogram which showed EF 60 to 65%.  #4 .liver cirrhosis, patient is on Lasix, beta-blockers, Aldactone, lisinopril suffered because of hyperkalemia with potassium 5.5, hyperkalemia improved, potassium 5.2 after stopping the medicine, 5. hepatic hydrothorax, confirmed by nuclear medicine study.  Patient has this because of worsening Kelly Mclean cirrhosis, needs TIPS procedure. 6.  History of depression, anxiety, patient is on BuSpar, stable Celexa.  Continue other medicines as per MAR.  Discharge Condition: Stable   Follow UP      Discharge Instructions  and  Discharge Medications     Allergies as of 03/27/2018      Reactions   Biaxin [clarithromycin] Shortness Of Breath, Rash   Penicillins Anaphylaxis, Other (See Comments)   Did it involve swelling of the face/tongue/throat, SOB, or low BP? Unknown Did it involve sudden or severe rash/hives, skin peeling, or any reaction on the inside of your  mouth or nose? Unknown Did you need to seek medical attention at a hospital or doctor's office? Yes When did it last happen?childhood If  all above answers are "NO", may proceed with cephalosporin use.   Zoloft [sertraline Hcl] Other (See Comments)   Reaction: "made crazy"   Milk-related Compounds Diarrhea   Lactose Intolerance (gi)       Medication List    STOP taking these medications   albuterol 108 (90 Base) MCG/ACT inhaler Commonly known as:  PROVENTIL HFA;VENTOLIN HFA Replaced by:  albuterol (2.5 MG/3ML) 0.083% nebulizer solution   atenolol 25 MG tablet Commonly known as:  TENORMIN   lidocaine 5 % Commonly known as:  LIDODERM   lisinopril 2.5 MG tablet Commonly known as:  PRINIVIL,ZESTRIL   montelukast 10 MG tablet Commonly known as:  SINGULAIR     TAKE these medications   albuterol (2.5 MG/3ML) 0.083% nebulizer solution Commonly known as:  PROVENTIL Inhale 3 mLs (2.5 mg total) into the lungs every 6 (six) hours as needed for wheezing or shortness of breath. Replaces:  albuterol 108 (90 Base) MCG/ACT inhaler   busPIRone 10 MG tablet Commonly known as:  BUSPAR Take 10 mg by mouth 3 (three) times daily.   calcium carbonate 500 MG chewable tablet Commonly known as:  TUMS - dosed in mg elemental calcium Chew 2 tablets by mouth 2 (two) times daily.   citalopram 20 MG tablet Commonly known as:  CELEXA Take 20 mg by mouth daily.   docusate sodium 100 MG capsule Commonly known as:  COLACE Take 100 mg by mouth 2 (two) times daily.   famotidine 40 MG tablet Commonly known as:  PEPCID Take 40 mg by mouth at bedtime.   furosemide 20 MG tablet Commonly known as:  LASIX Take 1 tablet (20 mg total) by mouth daily.   gabapentin 100 MG capsule Commonly known as:  NEURONTIN Take 200 mg by mouth at bedtime.   primidone 50 MG tablet Commonly known as:  MYSOLINE Take 50 mg by mouth 2 (two) times daily.         Diet and Activity recommendation: See Discharge Instructions above   Consults obtained -gastroenterology, oncology, dietitian   Major procedures and Radiology Reports - PLEASE review  detailed and final reports for all details, in brief -      Ct Abdomen Pelvis Wo Contrast  Result Date: 03/20/2018 CLINICAL DATA:  History of cirrhosis of liver, dyspnea. Hepatic hydrothorax. Fluid with lymphocytes requiring rule out lymphoma. EXAM: CT ABDOMEN AND PELVIS WITHOUT CONTRAST TECHNIQUE: Multidetector CT imaging of the abdomen and pelvis was performed following the standard protocol without IV contrast. COMPARISON:  CT abdomen dated 03/06/2018 FINDINGS: Lower chest: Again noted is a large LEFT pleural effusion, incompletely imaged. Lingular consolidation is likely associated atelectasis. Hepatobiliary: Cirrhotic appearing liver. Gallstones are present within the nondistended gallbladder. No bile duct dilatation seen. Pancreas: Unremarkable. No pancreatic ductal dilatation or surrounding inflammatory changes. Spleen: Normal in size without focal abnormality. Adrenals/Urinary Tract: Kidneys are unremarkable without mass, stone or hydronephrosis. No perinephric fluid. No ureteral or bladder calculi identified. Bladder is obscured by overlying ascites. Stomach/Bowel: No dilated large or small bowel loops. 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 Appendix is normal. Stomach is unremarkable. Vascular/Lymphatic: Aortic atherosclerosis. Enlarged lymph node adjacent to the pancreatic head, 1.6 cm, better seen on the recent contrast-enhanced CT of 03/06/2018. No other enlarged lymph nodes appreciated in the abdomen or pelvis. Reproductive: Uterus appears normal. Ascites obscures visualization of the bilateral adnexa. Other:  Moderate amount of ascites within the abdomen and pelvis, largest component within the pelvis. No evidence of circumscribed fluid collection or abscess. No free intraperitoneal air. Musculoskeletal: No acute or suspicious osseous finding. Superficial soft tissues are unremarkable. IMPRESSION: 1. Cirrhotic liver. 2. Moderate amount of ascites within the abdomen and pelvis, largest  component within the pelvis. 3. Enlarged lymph node adjacent to the pancreatic head, measuring 1.6 cm, better seen on the recent contrast-enhanced CT of 03/06/2016, favor reactive over neoplastic etiology given the absence of additional lymphadenopathy. 4. Large LEFT pleural effusion, incompletely imaged. 5. Cholelithiasis without evidence of acute cholecystitis. Aortic Atherosclerosis (ICD10-I70.0). Electronically Signed   By: Franki Cabot M.D.   On: 03/20/2018 14:20   Dg Chest 1 View  Result Date: 03/26/2018 CLINICAL DATA:  Shortness of breath. EXAM: CHEST  1 VIEW COMPARISON:  Multiple prior exams most recent radiograph 03/24/2018. Most recent CT 03/14/2018 FINDINGS: Known left pleural effusion has increased in size over the past 2 days, now large in size. Associated compressive atelectasis throughout the left lung. Mild rightward mediastinal shift versus rotation. Only a small portion of aerated left perihilar lung. Hypoventilatory right lung without acute finding. No visualized pneumothorax. IMPRESSION: Increased size of known left pleural effusion, now large in size. This causes mild mass effect in the mediastinum with rightward mediastinal shift. Electronically Signed   By: Keith Rake M.D.   On: 03/26/2018 19:37   Dg Chest 1 View  Result Date: 03/14/2018 CLINICAL DATA:  Status post thoracentesis EXAM: CHEST  1 VIEW COMPARISON:  March 14, 2018 12:56 p.m. FINDINGS: The heart size and mediastinal contours are within normal limits. There is small left pleural effusion significantly decreased compared prior exam. Consolidation left lung base is noted. There is no pneumothorax. The right lung is clear. The visualized skeletal structures are stable. IMPRESSION: There is small left pleural effusion, significantly decreased compared prior exam. Consolidation of left lung base is identified. There is no pneumothorax. Electronically Signed   By: Abelardo Diesel M.D.   On: 03/14/2018 16:19   Ct Chest Wo  Contrast  Result Date: 03/14/2018 CLINICAL DATA:  Pleural effusion and dyspnea since yesterday. EXAM: CT CHEST WITHOUT CONTRAST TECHNIQUE: Multidetector CT imaging of the chest was performed following the standard protocol without IV contrast. COMPARISON:  03/14/2018 CXR FINDINGS: Cardiovascular: Common arterial branch of the right brachiocephalic left common carotid arteries. Atherosclerosis of left subclavian artery origin. Nonaneurysmal atherosclerotic aorta. The unenhanced pulmonary vessels are unremarkable. The heart size is normal with small anterior pericardial effusion without thickening noted. Mediastinum/Nodes: Small subcentimeter prevascular and paratracheal lymph nodes. 1 cm short axis subcarinal lymph node. Patent trachea and mainstem bronchi. Unremarkable CT appearance of the esophagus. The thyroid gland is unremarkable without dominant mass. Lungs/Pleura: Moderate to large layering left effusion with adjacent atelectasis. The right lung is clear. No pneumothorax or dominant mass. Upper Abdomen: Cirrhotic appearance of the liver with moderate to large volume of ascites. Musculoskeletal: No chest wall mass or suspicious bone lesions identified. Mild thoracic spondylosis. IMPRESSION: 1. Moderate to large layering left pleural effusion with adjacent atelectasis. 2. Cirrhotic liver with moderate to large volume of ascites. Aortic Atherosclerosis (ICD10-I70.0). Electronically Signed   By: Ashley Royalty M.D.   On: 03/14/2018 22:17   US Abdomen Complete  Result Date: 03/04/2018 CLINICAL DATA:  63 year old with hepatic cirrhosis. EXAM: ABDOMEN ULTRASOUND COMPLETE COMPARISON:  Visualized upper abdomen on CT chest 02/18/2018. FINDINGS: Gallbladder: Numerous shadowing gallstones, the largest measuring approximately 9 mm. Echogenic sludge. Mild gallbladder  wall thickening up to approximately 4 mm. No pericholecystic fluid. Negative sonographic Murphy's sign according to the ultrasound technologist. Common bile  duct: Diameter: Approximately 4 mm. No visible bile duct stones. Liver: Diffusely coarsened echotexture and irregular hepatic contour. No focal hepatic parenchymal abnormalities. Mildly increased echotexture diffusely. Portal vein is patent on color Doppler imaging with normal direction of blood flow towards the liver. IVC: Patent. Pancreas: Hypoechoic mass adjacent to or involving the pancreatic head or porta hepatis lymph node measuring approximately 2.4 x 1.4 x 2.8 cm. Visualized pancreas otherwise unremarkable. The tail is obscured by overlying bowel gas. The pancreas was not included on the recent CT chest. Spleen: Enlarged, measuring approximately 15.8 x 15.8 x 5.2 cm, giving a volume of approximately 681 mL. No focal parenchymal abnormality. Right Kidney: Length: Approximately 9.0 cm. No hydronephrosis. Well-preserved cortex. Mildly echogenic parenchyma. Approximate 1.2 x 1.1 x 2.0 cm mildly complex cyst with a thin internal septation involving the UPPER pole of the RIGHT kidney. No solid renal mass. Left Kidney: Length: Approximately 8.2 cm. No hydronephrosis. Well-preserved cortex. Mildly echogenic parenchyma. No focal parenchymal abnormalities. Abdominal aorta: Normal in caliber throughout its visualized course in the abdomen with evidence of atherosclerosis. Maximum diameter 2.4 cm. Other findings: Small amount of ascites. Large LEFT pleural effusion. IMPRESSION: 1. Hepatic cirrhosis.  No focal hepatic parenchymal abnormality. 2. Hypoechoic mass adjacent to or involving the pancreatic head versus enlarged porta hepatis lymph node. The pancreas was not included on the recent CT chest, so a CT of the abdomen and pelvis with contrast is recommended in further evaluation. 3. Cholelithiasis.  No sonographic evidence of acute cholecystitis. 4. Splenomegaly without focal splenic parenchymal abnormality, indicating portal hypertension. 5. Patent portal vein with normal antegrade hepatopetal flow. 6. Small amount  of ascites. 7. Large LEFT pleural effusion. Electronically Signed   By: Evangeline Dakin M.D.   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 Plan for nuclear medicine ascites - hydrothorax examination. Plan for an ultrasound-guided paracentesis and injection of radiopharmaceutical into the ascites. Left thoracentesis was performed prior to this procedure. EXAM: ULTRASOUND GUIDED PARACENTESIS INJECTION OF RADIOPHARMACEUTICAL INTO ASCITES MEDICATIONS: None. COMPLICATIONS: None immediate. PROCEDURE: Informed written consent was obtained from the patient after a discussion of the risks, benefits and alternatives to treatment. A timeout was performed prior to the initiation of the procedure. Initial ultrasound scanning demonstrates a small amount of ascites within the right upper abdominal quadrant. The right upper abdomen was prepped and draped in the usual sterile fashion. 1% lidocaine with was used for local anesthesia. Following this, a 6 Fr Safe-T-Centesis catheter was introduced. An ultrasound image was saved for documentation purposes. The paracentesis was performed. 60 mL of opaque yellow fluid was removed. The radiopharmaceutical was injected through the Safe-T-Centesis catheter. Catheter was flushed with sterile saline. The catheter was removed and a dressing was applied. The patient tolerated the procedure well without immediate post procedural complication. FINDINGS: Small amount of fluid around the liver. Small amount of fluid in the left lower quadrant. 60 mL of fluid was removed from the right upper quadrant. The radiopharmaceutical was injected into the right upper quadrant. IMPRESSION: Successful ultrasound-guided paracentesis yielding 60 mL of ascites. Fluid was sent for labs. Radiopharmaceutical was injected through the paracentesis catheter for the nuclear medicine examination. Electronically Signed   By:  Markus Daft M.D.   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 Patient is being evaluated for a hepatic hydrothorax. EXAM: NUCLEAR MEDICINE SULFUR COLLOID PERITONEAL SCINTIGRAPHY TECHNIQUE: Ultrasound-guided left thoracentesis was performed. 1.1 L of left pleural fluid was removed but not all of the fluid was removed. Subsequently, an ultrasound-guided paracentesis was performed in the right upper quadrant. 60 mL of peritoneal fluid was removed. Technetium 99 M sulfur colloid was injected through the paracentesis catheter. Paracentesis catheter was removed. RADIOPHARMACEUTICALS:  7 millicuries technetium 99 M sulfur colloid COMPARISON:  Chest CT 03/14/2018 FINDINGS: Imaging was obtained over 1 hour. The initial images demonstrated radiopharmaceutical throughout the peritoneal cavity. The largest peritoneal pockets are in the right upper quadrant and left lower quadrant. Over time, radiopharmaceutical starts to accumulate in the left hemithorax. Findings are compatible with a trans diaphragmatic connection between the peritoneal cavity and the left pleural space. IMPRESSION: Study is positive for connection between the peritoneal space and left pleural space. 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 Electronically Signed   By: Markus Daft M.D.   On: 03/19/2018 17:24   US Paracentesis  Result Date: 03/19/2018 INDICATION: 62 year old with cirrhosis and recurrent left hydrothorax. Plan for nuclear medicine ascites - hydrothorax examination. Plan for an ultrasound-guided paracentesis and injection of radiopharmaceutical into the ascites. Left thoracentesis was performed prior to this procedure. EXAM: ULTRASOUND GUIDED PARACENTESIS INJECTION OF RADIOPHARMACEUTICAL INTO ASCITES MEDICATIONS: None. COMPLICATIONS: None immediate. PROCEDURE: Informed written consent was  obtained from the patient after a discussion of the risks, benefits and alternatives to treatment. A timeout was performed prior to the initiation of the procedure. Initial ultrasound scanning demonstrates a small amount of ascites within the right upper abdominal quadrant. The right upper abdomen was prepped and draped in the usual sterile fashion. 1% lidocaine with was used for local anesthesia. Following this, a 6 Fr Safe-T-Centesis catheter was introduced. An ultrasound image was saved for documentation purposes. The paracentesis was performed. 60 mL of opaque yellow fluid was removed. The radiopharmaceutical was injected through the Safe-T-Centesis catheter. Catheter was flushed with sterile saline. The catheter was removed and a dressing was applied. The patient tolerated the procedure well without immediate post procedural complication. FINDINGS: Small amount of fluid around the liver. Small amount of fluid in the left lower quadrant. 60 mL of fluid was removed from the right upper quadrant. The radiopharmaceutical was injected into the right upper quadrant. IMPRESSION: Successful ultrasound-guided paracentesis yielding 60 mL of ascites. Fluid was sent for labs. Radiopharmaceutical was injected through the paracentesis catheter for the nuclear medicine examination. Electronically Signed   By: Markus Daft M.D.   On: 03/19/2018 17:06   Ct Abdomen W Wo Contrast  Result Date: 03/06/2018 CLINICAL DATA:  Pancreatic mass seen on earlier [place]. Pancreatic mass workup . Cirrhosis. Epigastric and LUQ pain. 27m of Omni 300 used. Pt could only tolerate laying on her left side for scan.^1046mOMNIPAQUE IOHEXOL 300 MG/ML SOLNPancreatic mass EXAM: CT ABDOMEN WITHOUT AND WITH CONTRAST TECHNIQUE: Multidetector CT imaging of the abdomen was performed following the standard protocol before and following the bolus administration of intravenous contrast. CONTRAST:  10055mMNIPAQUE IOHEXOL 300 MG/ML  SOLN COMPARISON:  Ultrasound  03/04/2010 FINDINGS: Lower chest:  Large LEFT pleural effusion with passive atelectasis. Hepatobiliary: Nodular liver. Caudate lobe is enlarged. Portal veins are patent. Multiple gallstones within lumen gallbladder. No enhancing hepatic lesion. No intrahepatic or extrahepatic biliary duct dilatation. Pancreas: No abnormality of the pancreatic head body or  tail. No duct dilatation. Periampullary duodenum diverticulum noted. Lymph node position inferior to the pancreas adjacent to the portal vein measures 16 mm (image 49/4) Spleen: Spleen borderline enlarged. Adrenals/urinary tract: LEFT kidney is normal. RIGHT kidney normal. No adrenal abnormality Stomach/Bowel: Stomach and limited of the small bowel is unremarkable Vascular/Lymphatic: Abdominal aortic normal caliber. No retroperitoneal periportal lymphadenopathy. Musculoskeletal: No aggressive osseous lesion IMPRESSION: 1. No pancreatic lesion identified. 2. Peripancreatic lymph node is mildly enlarged. 3. Small periampullary duodenum diverticulum present. 4. Morphologic changes in liver consistent with cirrhosis. No biliary duct dilatation of the intrahepatic or extrahepatic bile ducts. 5. No enhancing hepatic lesion. 6. Cholelithiasis. 7. Moderate volume of intraperitoneal free fluid. 8. Large LEFT pleural effusion Electronically Signed   By: Suzy Bouchard M.D.   On: 03/06/2018 16:31   Dg Bone Density (dxa)  Result Date: 03/11/2018 EXAM: DUAL X-RAY ABSORPTIOMETRY (DXA) FOR BONE MINERAL DENSITY IMPRESSION: Dear Dr, Lutricia Feil, Your patient Phallon Haydu completed a FRAX assessment on 03/11/2018 using the [employer] (analysis version: 14.10) manufactured by [employer]. The following summarizes the results of our evaluation. PATIENT BIOGRAPHICAL: Name: Ariela, Mochizuki Patient ID: [id number] Birth Date: 1955-07-12 Height:    62.5 in. Gender:     Female    [age]       Weight:    158.4 lbs. Ethnicity:  White                             Exam Date: 03/11/2018 FRAX* RESULTS:  (version: 3.5) 10-year Probability of Fracture1 Major Osteoporotic Fracture2 Hip Fracture 8.1% 0.7% Population: [place] (Caucasian) Risk Factors: None Based on Femur (Left) Neck BMD 1 -The 10-year probability of fracture may be lower than reported if the patient has received treatment. 2 -Major Osteoporotic Fracture: Clinical Spine, Forearm, Hip or Shoulder *FRAX is a Materials engineer of the [employer] for Metabolic Bone Disease, a [place] (WHO) [facility]. ASSESSMENT: The probability of a major osteoporotic fracture is 8.1% within the next ten years. The probability of a hip fracture is 0.7% within the next ten years. . Technologist: SCE PATIENT BIOGRAPHICAL: Name: Veera, Stapleton Patient ID: [id number] Birth Date: [birth year] Height: 62.5 in. Gender: Female Exam Date: 03/11/2018 Weight: 158.4 lbs. Indications: Asthma, Caucasian, Cirrhosis, Early Menopause, Postmenopausal, Previous Smoker Fractures: Treatments: Gabapentin, primidone ASSESSMENT: The BMD measured at Femur Neck Left is 0.833 g/cm2 with a T-score of -1.5. This patient is considered osteopenic according to [facility]) criteria. The quality of the scan is good. Site Region Measured Measured WHO Young Adult BMD Date       Age      Classification T-score AP Spine L1-L4 03/11/2018 [age] Osteopenia -1.5 1.007 g/cm2 DualFemur Neck Left 03/11/2018 61.0 Osteopenia -1.5 0.833 g/cm2 World Health Organization [facility]) criteria for post-menopausal, Caucasian Women: Normal:       T-score at or above -1 SD Osteopenia:   T-score between -1 and -2.5 SD Osteoporosis: T-score at or below -2.5 SD RECOMMENDATIONS: 1. All patients should optimize calcium and vitamin D intake. 2. Consider FDA-approved medical therapies in postmenopausal women and men aged 20 years and older, based on the following: a. A hip or vertebral(clinical or morphometric) fracture b. T-score <  -2.5 at the femoral neck or spine after appropriate evaluation to exclude secondary causes c. Low bone mass (T-score between -1.0 and -2.5 at the femoral neck or spine)  and a 10-year probability of a hip fracture > 3% or a 10-year probability of a major osteoporosis-related fracture > 20% based on the [place]-adapted WHO algorithm d. Clinician judgment and/or patient preferences may indicate treatment for people with 10-year fracture probabilities above or below these levels FOLLOW-UP: People with diagnosed cases of osteoporosis or at high risk for fracture should have regular bone mineral density tests. For patients eligible for Medicare, routine testing is allowed once every 2 years. The testing frequency can be increased to one year for patients who have rapidly progressing disease, those who are receiving or discontinuing medical therapy to restore bone mass, or have additional risk factors. I have reviewed this report, and agree with the above findings. [facility] Radiology Electronically Signed   By: Lowella Grip III M.D.   On: 03/11/2018 11:23   Dg Chest Port 1 View  Result Date: 03/24/2018 CLINICAL DATA:  Post left thoracentesis EXAM: PORTABLE CHEST 1 VIEW COMPARISON:  03/21/2018 FINDINGS: Moderate to large left pleural effusion minimally changed since prior study. No pneumothorax. Left basilar opacity, likely atelectasis. No confluent opacity on the right. Heart is upper limits normal in size. IMPRESSION: Moderate to large-sized left pleural effusion minimally changed. No pneumothorax. Continued left base atelectasis or infiltrate. Electronically Signed   By: Rolm Baptise M.D.   On: 03/24/2018 12:46   Dg Chest Port 1 View  Result Date: 03/19/2018 CLINICAL DATA:  Status post thoracentesis. EXAM: PORTABLE CHEST 1 VIEW COMPARISON:  03/14/2018 FINDINGS: 1702 hours. Moderate to large left pleural effusion noted without evidence for pneumothorax. Left base collapse/consolidation. The cardio pericardial  silhouette is enlarged. The visualized bony structures of the thorax are intact. Telemetry leads overlie the chest. IMPRESSION: 1. No evidence for pneumothorax status post thoracentesis. 2. Moderate to large left pleural effusion. Electronically Signed   By: Misty Stanley M.D.   On: 03/19/2018 18:56   Dg Chest Portable 1 View  Result Date: 03/14/2018 CLINICAL DATA:  Shortness of breath. EXAM: PORTABLE CHEST 1 VIEW COMPARISON:  Chest x-ray 03/06/2018. FINDINGS: Mediastinum and hilar structures normal. Large left pleural effusion, increased in size from prior exam. Underlying left lung atelectasis/infiltrate can not be excluded. No pneumothorax. Heart size most likely stable. IMPRESSION: Large left pleural effusion, increased from prior exam. Electronically Signed   By: Marcello Moores  Register   On: 03/14/2018 13:19   Dg Chest Port 1 View  Result Date: 03/06/2018 CLINICAL DATA:  Post large volume left-sided thoracentesis. EXAM: PORTABLE CHEST 1 VIEW COMPARISON:  Chest radiograph-02/20/2018; CT abdomen pelvis-earlier same day FINDINGS: Interval reduction in persistent moderate size left-sided effusion post large volume thoracentesis. No pneumothorax. Improved aeration of left lung base with persistent left basilar heterogeneous/consolidative opacities. No definite evidence of right-sided pleural effusion. No new focal airspace opacities. Unchanged cardiac silhouette and mediastinal contours. No acute osseous abnormalities. IMPRESSION: Interval reduction in persistent moderate sized left-sided effusion post thoracentesis. No pneumothorax. Electronically Signed   By: Sandi Mariscal M.D.   On: 03/06/2018 16:02   Mm 3d Screen Breast Bilateral  Result Date: 03/12/2018 CLINICAL DATA:  Screening. EXAM: DIGITAL SCREENING BILATERAL MAMMOGRAM WITH TOMO AND CAD COMPARISON:  Previous exam(s). ACR Breast Density Category b: There are scattered areas of fibroglandular density. FINDINGS: There are no findings suspicious for  malignancy. Images were processed with CAD. IMPRESSION: No mammographic evidence of malignancy. A result letter of this screening mammogram will be mailed directly to the patient. RECOMMENDATION: Screening mammogram in one year. (Code:SM-B-01Y) BI-RADS CATEGORY  1: Negative. Electronically Signed  By: Dorise Bullion III M.D   On: 03/12/2018 14:44   Nm Hepato Biliary Leak  Result Date: 03/17/2018 CLINICAL DATA:  Cholelithiasis. Nausea and vomiting. Hepatic cirrhosis. EXAM: NUCLEAR MEDICINE HEPATOBILIARY IMAGING TECHNIQUE: Sequential images of the abdomen were obtained out to 60 minutes following intravenous administration of radiopharmaceutical. RADIOPHARMACEUTICALS:  5.1 mCi Tc-61m Choletec IV COMPARISON:  CT on 03/14/2018 FINDINGS: Delayed uptake and biliary excretion of activity by the liver is seen, consistent with known hepatic cirrhosis. Gallbladder activity is visualized, consistent with patency of cystic duct. Biliary activity passes into small bowel, consistent with patent common bile duct. IMPRESSION: No evidence of acute cholecystitis or biliary ductal dilatation. Hepatocellular dysfunction, consistent with hepatic cirrhosis. Electronically Signed   By: JEarle GellM.D.   On: 03/17/2018 14:44   UKoreaThoracentesis Asp Pleural Space W/img Guide  Result Date: 03/24/2018 INDICATION: Patient with history of hepatic hydrothorax, cirrhosis and recurrent left pleural effusion who presents today originally for paracentesis in order to obtain flow cytometry to r/o malignancy prior to possible TIPS procedure. Limited abdominal UKoreashows scant peritoneal fluid not amenable to percutaneous drainage. Diagnostic and therapeutic thoracentesis performed in order to obtain requested labs in lieu of paracentesis given patient's history of hepatic hydrothorax. EXAM: ULTRASOUND GUIDED LEFT THORACENTESIS MEDICATIONS: 20 mL 1% lidocaine. COMPLICATIONS: None immediate. PROCEDURE: An ultrasound guided thoracentesis was  thoroughly discussed with the patient and questions answered. The benefits, risks, alternatives and complications were also discussed. The patient understands and wishes to proceed with the procedure. Written consent was obtained. Ultrasound was performed to localize and mark an adequate pocket of fluid in the left chest. The area was then prepped and draped in the normal sterile fashion. 1% Lidocaine was used for local anesthesia. Under ultrasound guidance a 6 Fr Safe-T-Centesis catheter was introduced. Thoracentesis was performed. The catheter was removed and a dressing applied. FINDINGS: A total of approximately 1.1 L of pink milky fluid was removed. Samples were sent to the laboratory as requested by the clinical team. IMPRESSION: Successful ultrasound guided left thoracentesis yielding 1.1 L of pleural fluid. Read by SCandiss Norse PA-C Electronically Signed   By: DLucrezia EuropeM.D.   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 Patient is being evaluated for a connection between the peritoneal and left pleural space with a nuclear medicine examination. Patient has a large amount of left pleural fluid on ultrasound examination. Plan to remove some of the left pleural fluid prior to the nuclear medicine examination. EXAM: ULTRASOUND GUIDED LEFT THORACENTESIS MEDICATIONS: None. COMPLICATIONS: None immediate. PROCEDURE: An ultrasound guided thoracentesis was thoroughly discussed with the patient and questions answered. The benefits, risks, alternatives and complications were also discussed. The patient understands and wishes to proceed with the procedure. Written consent was obtained. Ultrasound was performed to localize and mark an adequate pocket of fluid in the left chest. The area was then prepped and draped in the normal sterile fashion. 1% Lidocaine was used for local anesthesia.  Under ultrasound guidance a 6 Fr Safe-T-Centesis catheter was introduced. Thoracentesis was performed. The catheter was removed and a dressing applied. FINDINGS: A total of approximately 1.1 L of opaque yellow fluid was removed. Samples were sent to the laboratory as requested by the clinical team. IMPRESSION: Successful ultrasound guided left thoracentesis yielding 1.1 L of pleural fluid. Electronically Signed   By: AMarkus DaftM.D.   On: 03/19/2018 17:11  US Thoracentesis Asp Pleural Space W/img Guide  Result Date: 03/14/2018 CLINICAL DATA:  Recurrent left pleural effusion. EXAM: ULTRASOUND GUIDED LEFT THORACENTESIS COMPARISON:  None. PROCEDURE: An ultrasound guided thoracentesis was thoroughly discussed with the patient and questions answered. The benefits, risks, alternatives and complications were also discussed. The patient understands and wishes to proceed with the procedure. Written consent was obtained. Ultrasound was performed to localize and mark an adequate pocket of fluid in the left chest. The area was then prepped and draped in the normal sterile fashion. 1% Lidocaine was used for local anesthesia. Under ultrasound guidance a 6 French Safe-T-Centesis catheter was introduced. Thoracentesis was performed. The catheter was removed and a dressing applied. COMPLICATIONS: None FINDINGS: A total of approximately 2.3 L of cloudy, yellowish-brown fluid was removed. A fluid sample was sent for laboratory analysis. IMPRESSION: Successful ultrasound guided left thoracentesis yielding 2.3 L of pleural fluid. Electronically Signed   By: Aletta Edouard M.D.   On: 03/14/2018 16:13   US Thoracentesis Asp Pleural Space W/img Guide  Result Date: 03/06/2018 INDICATION: History of cirrhosis now with recurrent symptomatic left-sided pleural effusion worrisome for hepatic hydrothorax. Please from ultrasound-guided thoracentesis for therapeutic purposes. EXAM: US THORACENTESIS ASP PLEURAL SPACE W/IMG GUIDE  COMPARISON:  Ultrasound-guided thoracentesis-03/22/2018 (yielding 2.2 L of pleural fluid); CT abdomen pelvis-earlier same day MEDICATIONS: None. COMPLICATIONS: None immediate. TECHNIQUE: Informed written consent was obtained from the patient after a discussion of the risks, benefits and alternatives to treatment. A timeout was performed prior to the initiation of the procedure. Initial ultrasound scanning demonstrates a large anechoic left-sided pleural effusion. The lower chest was prepped and draped in the usual sterile fashion. 1% lidocaine was used for local anesthesia. An ultrasound image was saved for documentation purposes. An 8 Fr Safe-T-Centesis catheter was introduced. The thoracentesis was performed. Despite residual fluid within the left pleural space, the patient wished to terminate the procedure given left-sided chest pain. As such, the catheter was removed and a dressing was applied. The patient tolerated the procedure well without immediate post procedural complication. The patient was escorted to have an upright chest radiograph. FINDINGS: A total of approximately 1.9 liters of amber colored serous fluid was removed. IMPRESSION: Successful ultrasound-guided left sided thoracentesis yielding 1.9 liters of pleural fluid. Electronically Signed   By: Sandi Mariscal M.D.   On: 03/06/2018 16:04    Micro Results     Recent Results (from the past 240 hour(s))  Body fluid culture     Status: None   Collection Time: 03/19/18  3:50 PM  Result Value Ref Range Status   Specimen Description   Final    PLEURAL Performed at [facility], [address], [place], [place] [address]    Special Requests   Final    NONE Performed at [facility], [address] [place], [place] [address]    Gram Stain   Final    FEW WBC PRESENT, PREDOMINANTLY MONONUCLEAR NO ORGANISMS SEEN    Culture   Final    NO GROWTH 3 DAYS Performed at [facility], [place] [address], [place],  [place] [address]    Report Status 03/23/2018 FINAL  Final  Body fluid culture     Status: None   Collection Time: 03/19/18  4:00 PM  Result Value Ref Range Status   Specimen Description   Final    PERITONEAL Performed at [facility], [address] [place], [place] [address]    Special Requests   Final  NONE Performed at [facility] [address] [place], [place] [address]    Gram Stain   Final    RARE WBC PRESENT, PREDOMINANTLY MONONUCLEAR NO ORGANISMS SEEN    Culture   Final    NO GROWTH 3 DAYS Performed at [facility] [address], [place], [place] [address]    Report Status 03/22/2018 FINAL  Final       Today   Subjective:   Lanett Lasorsa to stable for transfer to [facility] for TIPS procedure. Objective:   Blood pressure (!) 107/42, pulse 78, temperature 98 F (36.7 C), temperature source Oral, resp. rate (!) 22, height 5' 2"  (1.575 m), weight 69 kg, SpO2 99 %.   Intake/Output Summary (Last 24 hours) at 03/27/2018 0803 Last data filed at 03/26/2018 0908 Gross per 24 hour  Intake 240 ml  Output -  Net 240 ml    Exam Awake Alert, Oriented x 3, No new F.N deficits, Normal affect [place].AT,PERRAL Supple Neck,No JVD, No cervical lymphadenopathy appriciated.  Symmetrical Chest wall movement, Good air movement bilaterally, CTAB RRR,No Gallops,Rubs or new Murmurs, No Parasternal Heave +ve B.Sounds, Abd Soft, Non tender, No organomegaly appriciated, No rebound -guarding or rigidity. No Cyanosis, Clubbing or edema, No new Rash or bruise  Data Review   CBC w Diff:  Lab Results  Component Value Date   WBC 4.5 03/26/2018   HGB 9.1 (L) 03/26/2018   HCT 28.7 (L) 03/26/2018   PLT 86 (L) 03/26/2018   LYMPHOPCT 7 02/19/2018   MONOPCT 8 02/19/2018   EOSPCT 3 02/19/2018   BASOPCT 1 02/19/2018    CMP:  Lab Results  Component Value Date   NA 136 03/26/2018   K 5.4 (H) 03/26/2018   CL 105 03/26/2018   CO2 23 03/26/2018    BUN 39 (H) 03/26/2018   CREATININE 2.21 (H) 03/26/2018   PROT 6.0 (L) 03/26/2018   ALBUMIN 2.6 (L) 03/26/2018   BILITOT 1.1 03/26/2018   ALKPHOS 82 03/26/2018   AST 43 (H) 03/26/2018   ALT 32 03/26/2018  .   Total Time in preparing paper work, data evaluation and todays exam - 35 minutes  Epifanio Lesches M.D on 03/27/2018 at 8:03 AM    Note: This dictation was prepared with Dragon dictation along with smaller phrase technology. Any transcriptional errors that result from this process are unintentional.

## 2018-03-27 NOTE — Care Management Important Message (Signed)
Important Message  Patient Details  Name: Kelly Mclean MRN: [id number] Date of Birth: 03-24-1955   Medicare Important Message Given:  Yes    Juliann Pulse A Tarica Harl 03/27/2018, 12:34 PM

## 2018-03-28 ENCOUNTER — Inpatient Hospital Stay (HOSPITAL_COMMUNITY): Payer: Medicare (Managed Care)

## 2018-03-28 ENCOUNTER — Inpatient Hospital Stay (HOSPITAL_COMMUNITY)
Admission: AD | Admit: 2018-03-28 | Discharge: 2018-04-09 | DRG: 432 | Disposition: A | Discharge status: Home Health | Payer: Medicare (Managed Care) | Source: Other Acute Inpatient Hospital | Attending: Internal Medicine | Admitting: Internal Medicine

## 2018-03-28 DIAGNOSIS — K767 Hepatorenal syndrome: Secondary | ICD-10-CM | POA: Diagnosis present

## 2018-03-28 DIAGNOSIS — F329 Major depressive disorder, single episode, unspecified: Secondary | ICD-10-CM | POA: Diagnosis present

## 2018-03-28 DIAGNOSIS — K769 Liver disease, unspecified: Secondary | ICD-10-CM | POA: Diagnosis not present

## 2018-03-28 DIAGNOSIS — D61818 Other pancytopenia: Secondary | ICD-10-CM | POA: Diagnosis present

## 2018-03-28 DIAGNOSIS — J45909 Unspecified asthma, uncomplicated: Secondary | ICD-10-CM | POA: Diagnosis present

## 2018-03-28 DIAGNOSIS — Z79899 Other long term (current) drug therapy: Secondary | ICD-10-CM

## 2018-03-28 DIAGNOSIS — Z515 Encounter for palliative care: Secondary | ICD-10-CM

## 2018-03-28 DIAGNOSIS — K7460 Unspecified cirrhosis of liver: Secondary | ICD-10-CM | POA: Diagnosis not present

## 2018-03-28 DIAGNOSIS — I129 Hypertensive chronic kidney disease with stage 1 through stage 4 chronic kidney disease, or unspecified chronic kidney disease: Secondary | ICD-10-CM | POA: Diagnosis present

## 2018-03-28 DIAGNOSIS — K7210 Chronic hepatic failure without coma: Secondary | ICD-10-CM

## 2018-03-28 DIAGNOSIS — J90 Pleural effusion, not elsewhere classified: Secondary | ICD-10-CM

## 2018-03-28 DIAGNOSIS — J948 Other specified pleural conditions: Secondary | ICD-10-CM

## 2018-03-28 DIAGNOSIS — K7581 Nonalcoholic steatohepatitis (NASH): Secondary | ICD-10-CM | POA: Diagnosis present

## 2018-03-28 DIAGNOSIS — R0602 Shortness of breath: Secondary | ICD-10-CM

## 2018-03-28 DIAGNOSIS — R188 Other ascites: Secondary | ICD-10-CM | POA: Diagnosis present

## 2018-03-28 DIAGNOSIS — J918 Pleural effusion in other conditions classified elsewhere: Secondary | ICD-10-CM | POA: Diagnosis present

## 2018-03-28 DIAGNOSIS — Z87891 Personal history of nicotine dependence: Secondary | ICD-10-CM

## 2018-03-28 DIAGNOSIS — Z66 Do not resuscitate: Secondary | ICD-10-CM | POA: Diagnosis present

## 2018-03-28 DIAGNOSIS — F4310 Post-traumatic stress disorder, unspecified: Secondary | ICD-10-CM | POA: Diagnosis present

## 2018-03-28 DIAGNOSIS — N189 Chronic kidney disease, unspecified: Secondary | ICD-10-CM

## 2018-03-28 DIAGNOSIS — G249 Dystonia, unspecified: Secondary | ICD-10-CM | POA: Diagnosis present

## 2018-03-28 DIAGNOSIS — E739 Lactose intolerance, unspecified: Secondary | ICD-10-CM | POA: Diagnosis present

## 2018-03-28 DIAGNOSIS — Z9889 Other specified postprocedural states: Secondary | ICD-10-CM

## 2018-03-28 DIAGNOSIS — D6959 Other secondary thrombocytopenia: Secondary | ICD-10-CM | POA: Diagnosis present

## 2018-03-28 DIAGNOSIS — N179 Acute kidney failure, unspecified: Secondary | ICD-10-CM | POA: Diagnosis not present

## 2018-03-28 DIAGNOSIS — Z7189 Other specified counseling: Secondary | ICD-10-CM | POA: Diagnosis not present

## 2018-03-28 DIAGNOSIS — E875 Hyperkalemia: Secondary | ICD-10-CM | POA: Diagnosis present

## 2018-03-28 DIAGNOSIS — N183 Chronic kidney disease, stage 3 (moderate): Secondary | ICD-10-CM | POA: Diagnosis present

## 2018-03-28 DIAGNOSIS — N19 Unspecified kidney failure: Secondary | ICD-10-CM

## 2018-03-28 LAB — CBC
HCT: 27.3 % — ABNORMAL LOW (ref 36.0–46.0)
Hemoglobin: 9.1 g/dL — ABNORMAL LOW (ref 12.0–15.0)
MCH: 33.8 pg (ref 26.0–34.0)
MCHC: 33.3 g/dL (ref 30.0–36.0)
MCV: 101.5 fL — AB (ref 80.0–100.0)
Platelets: 81 10*3/uL — ABNORMAL LOW (ref 150–400)
RBC: 2.69 MIL/uL — ABNORMAL LOW (ref 3.87–5.11)
RDW: 14.1 % (ref 11.5–15.5)
WBC: 4.9 10*3/uL (ref 4.0–10.5)
nRBC: 0 % (ref 0.0–0.2)

## 2018-03-28 LAB — COMPREHENSIVE METABOLIC PANEL
ALT: 31 U/L (ref 0–44)
AST: 43 U/L — ABNORMAL HIGH (ref 15–41)
Albumin: 2.3 g/dL — ABNORMAL LOW (ref 3.5–5.0)
Alkaline Phosphatase: 87 U/L (ref 38–126)
Anion gap: 7 (ref 5–15)
BUN: 45 mg/dL — ABNORMAL HIGH (ref 8–23)
CO2: 23 mmol/L (ref 22–32)
CREATININE: 2.32 mg/dL — AB (ref 0.44–1.00)
Calcium: 8.8 mg/dL — ABNORMAL LOW (ref 8.9–10.3)
Chloride: 103 mmol/L (ref 98–111)
GFR calc non Af Amer: 22 mL/min — ABNORMAL LOW (ref >60)
GFR, EST AFRICAN AMERICAN: 25 mL/min — AB (ref >60)
Glucose, Bld: 90 mg/dL (ref 70–99)
Potassium: 5.1 mmol/L (ref 3.5–5.1)
SODIUM: 133 mmol/L — AB (ref 135–145)
Total Bilirubin: 1.2 mg/dL (ref 0.3–1.2)
Total Protein: 5.9 g/dL — ABNORMAL LOW (ref 6.5–8.1)

## 2018-03-28 MED ORDER — PRIMIDONE 50 MG PO TABS
50.0000 mg | ORAL_TABLET | Freq: Two times a day (BID) | ORAL | Status: DC
  Administered 2018-03-28 – 2018-04-01 (×8): 50 mg via ORAL
  Filled 2018-03-28 (×8): qty 1

## 2018-03-28 MED ORDER — ONDANSETRON HCL 4 MG/2ML IJ SOLN
4.0000 mg | Freq: Four times a day (QID) | INTRAMUSCULAR | Status: DC | PRN
  Administered 2018-04-01 – 2018-04-07 (×6): 4 mg via INTRAVENOUS
  Filled 2018-03-28 (×6): qty 2

## 2018-03-28 MED ORDER — FAMOTIDINE 20 MG PO TABS
40.0000 mg | ORAL_TABLET | Freq: Every day | ORAL | Status: DC
  Administered 2018-03-28 – 2018-03-31 (×4): 40 mg via ORAL
  Filled 2018-03-28 (×5): qty 2

## 2018-03-28 MED ORDER — GABAPENTIN 100 MG PO CAPS
200.0000 mg | ORAL_CAPSULE | Freq: Every day | ORAL | Status: DC
  Administered 2018-03-28 – 2018-04-08 (×12): 200 mg via ORAL
  Filled 2018-03-28 (×12): qty 2

## 2018-03-28 MED ORDER — ACETAMINOPHEN 650 MG RE SUPP: 650.0000 mg | Freq: Four times a day (QID) | RECTAL | Status: DC | PRN

## 2018-03-28 MED ORDER — ONDANSETRON HCL 4 MG PO TABS
4.0000 mg | ORAL_TABLET | Freq: Four times a day (QID) | ORAL | Status: DC | PRN
  Administered 2018-04-09: 4 mg via ORAL
  Filled 2018-03-28 (×2): qty 1

## 2018-03-28 MED ORDER — DOCUSATE SODIUM 100 MG PO CAPS
100.0000 mg | ORAL_CAPSULE | Freq: Two times a day (BID) | ORAL | Status: DC
  Administered 2018-04-01: 100 mg via ORAL
  Filled 2018-03-28 (×7): qty 1

## 2018-03-28 MED ORDER — CITALOPRAM HYDROBROMIDE 20 MG PO TABS
20.0000 mg | ORAL_TABLET | Freq: Every day | ORAL | Status: DC
  Administered 2018-03-29 – 2018-04-09 (×12): 20 mg via ORAL
  Filled 2018-03-28 (×12): qty 1

## 2018-03-28 MED ORDER — FUROSEMIDE 20 MG PO TABS
20.0000 mg | ORAL_TABLET | Freq: Every day | ORAL | Status: DC
  Administered 2018-03-29: 20 mg via ORAL
  Filled 2018-03-28: qty 1

## 2018-03-28 MED ORDER — ACETAMINOPHEN 325 MG PO TABS: 650.0000 mg | ORAL_TABLET | Freq: Four times a day (QID) | ORAL | Status: DC | PRN

## 2018-03-28 MED ORDER — BUSPIRONE HCL 5 MG PO TABS
10.0000 mg | ORAL_TABLET | Freq: Three times a day (TID) | ORAL | Status: DC
  Administered 2018-03-28 – 2018-04-09 (×34): 10 mg via ORAL
  Filled 2018-03-28 (×34): qty 2

## 2018-03-28 MED ORDER — CALCIUM CARBONATE ANTACID 500 MG PO CHEW
2.0000 | CHEWABLE_TABLET | Freq: Two times a day (BID) | ORAL | Status: DC
  Administered 2018-04-01 – 2018-04-08 (×4): 400 mg via ORAL
  Filled 2018-03-28 (×21): qty 2

## 2018-03-28 MED ORDER — ALBUTEROL SULFATE (2.5 MG/3ML) 0.083% IN NEBU: 2.5000 mg | INHALATION_SOLUTION | Freq: Four times a day (QID) | RESPIRATORY_TRACT | Status: DC | PRN

## 2018-03-28 NOTE — Telephone Encounter (Signed)
Patient is currently in ED pending admission. Will await discharge to have patient scheduled. Nothing further is needed at this time.

## 2018-03-28 NOTE — Plan of Care (Signed)
Pt is being transferred to [facility] in [place] for a TIPS procedure.  She is going to [facility].  Called report to [place] at [phone].  IV will remain in R arm.  Pt has not c/o pain.  Has been up to [facility] w/assistance. Satting well at 99% on 2L after thoracentesis yesterday of L lung when they removed 2.2L.  [facility] has been arranged to transport.

## 2018-03-28 NOTE — Care Management (Addendum)
Called [facility] to notify them that the patient is discharging today, to be transferred to [facility]

## 2018-03-28 NOTE — Plan of Care (Signed)

## 2018-03-28 NOTE — Progress Notes (Signed)
[place] [facility] [place] NAME: Kelly Mclean    MR#:  [id number]  DATE OF BIRTH:  1955/07/31    CHIEF COMPLAINT:   Chief Complaint  Patient presents with  . Shortness of Breath  Patient is still waiting for bed at [facility] hospital, possible thoracentesis yesterday, remote 2.2 L of fluid from left lung due tore current left pleural effusion, patient feels better after they drained the fluid from left lung, she says she is feeling much better.  Eating better, resting better, more desaturation 100% on 2 L.  REVIEW OF SYSTEMS:    Review of Systems  Constitutional: Negative for chills and fever.  HENT: Negative for hearing loss.   Eyes: Negative for blurred vision, double vision and photophobia.  Respiratory: Negative for cough, hemoptysis and shortness of breath.   Cardiovascular: Negative for palpitations, orthopnea and leg swelling.  Gastrointestinal: Negative for abdominal pain, diarrhea and vomiting.  Genitourinary: Negative for dysuria and urgency.  Musculoskeletal: Negative for myalgias and neck pain.  Skin: Negative for rash.  Neurological: Negative for dizziness, focal weakness, seizures, weakness and headaches.  Psychiatric/Behavioral: Negative for memory loss. The patient does not have insomnia.      DRUG ALLERGIES:   Allergies  Allergen Reactions  . Biaxin [Clarithromycin] Shortness Of Breath and Rash  . Penicillins Anaphylaxis and Other (See Comments)    Did it involve swelling of the face/tongue/throat, SOB, or low BP? Unknown Did it involve sudden or severe rash/hives, skin peeling, or any reaction on the inside of your mouth or nose? Unknown Did you need to seek medical attention at a hospital or doctor's office? Yes When did it last happen?childhood If all above answers are "NO", may proceed with cephalosporin use.    Marland Kitchen Zoloft [Sertraline Hcl] Other (See Comments)    Reaction: "made crazy"  . Milk-Related  Compounds Diarrhea  . Lactose Intolerance (Gi)     VITALS:  Blood pressure (!) 116/57, pulse 68, temperature 98.3 F (36.8 C), resp. rate 17, height 5' 2"  (1.575 m), weight 69 kg, SpO2 99 %.  PHYSICAL EXAMINATION:   Physical Exam  GENERAL:  63 y.o.-year-old patient lying in the bed with no acute distress.  EYES: Pupils equal, round, reactive to light  No scleral icterus. Extraocular muscles intact.  HEENT: Head atraumatic, normocephalic. Oropharynx and nasopharynx clear.  NECK:  Supple, no jugular venous distention. No thyroid enlargement, no tenderness.  LUNGS diminished breath sounds bilaterally. CARDIOVASCULAR: S1, S2 normal. No murmurs, rubs, or gallops.  ABDOMEN: Soft, nontender, distended. Bowel sounds present. No organomegaly or mass.  EXTREMITIES: No cyanosis, clubbing or edema b/l.    NEUROLOGIC: Cranial nerves II through XII are intact. No focal Motor or sensory deficits b/l.   PSYCHIATRIC: The patient is alert and oriented x 3.  SKIN: No obvious rash, lesion, or ulcer.   LABORATORY PANEL:   CBC Recent Labs  Lab 03/26/18 2000  WBC 4.5  HGB 9.1*  HCT 28.7*  PLT 86*   ------------------------------------------------------------------------------------------------------------------ Chemistries  Recent Labs  Lab 03/26/18 2000  NA 136  K 5.4*  CL 105  CO2 23  GLUCOSE 97  BUN 39*  CREATININE 2.21*  CALCIUM 8.9  AST 43*  ALT 32  ALKPHOS 82  BILITOT 1.1   ------------------------------------------------------------------------------------------------------------------  Cardiac Enzymes No results for input(s): TROPONINI in the last 168 hours. ------------------------------------------------------------------------------------------------------------------  RADIOLOGY:  Dg Chest 1 View  Result Date: 03/26/2018 CLINICAL DATA:  Shortness of breath. EXAM: CHEST  1 VIEW COMPARISON:  Multiple prior exams most recent radiograph 03/24/2018. Most recent CT  03/14/2018 FINDINGS: Known left pleural effusion has increased in size over the past 2 days, now large in size. Associated compressive atelectasis throughout the left lung. Mild rightward mediastinal shift versus rotation. Only a small portion of aerated left perihilar lung. Hypoventilatory right lung without acute finding. No visualized pneumothorax. IMPRESSION: Increased size of known left pleural effusion, now large in size. This causes mild mass effect in the mediastinum with rightward mediastinal shift. Electronically Signed   By: Keith Rake M.D.   On: 03/26/2018 19:37   Dg Chest Port 1 View  Result Date: 03/27/2018 CLINICAL DATA:  History of hepatic hydrothorax post repeat large volume left-sided thoracentesis. EXAM: PORTABLE CHEST 1 VIEW COMPARISON:  Chest radiograph-03/26/2018 FINDINGS: Grossly unchanged cardiac silhouette and mediastinal contours. Interval reduction in persistent moderate size left-sided effusion post thoracentesis. No pneumothorax. Improved aeration of the left mid and lower lung with persistent left basilar heterogeneous/consolidative opacities. No new focal airspace opacities. No definite right-sided pleural effusion. No evidence of edema. No acute osseous abnormalities. IMPRESSION: 1. Interval reduction in persistent moderate size left-sided effusion post thoracentesis. No pneumothorax. 2. Improved aeration of the left lung base with persistent left basilar atelectasis. Electronically Signed   By: Sandi Mariscal M.D.   On: 03/27/2018 13:34   US Thoracentesis Asp Pleural Space W/img Guide  Result Date: 03/27/2018 INDICATION: History of left-sided hepatic hydrothorax now with recurrent symptomatic left-sided pleural effusion. Please perform ultrasound-guided thoracentesis for therapeutic purposes. EXAM: US THORACENTESIS ASP PLEURAL SPACE W/IMG GUIDE COMPARISON:  Chest radiograph-03/26/2018 MEDICATIONS: None. COMPLICATIONS: None immediate. TECHNIQUE: Informed written consent was  obtained from the patient after a discussion of the risks, benefits and alternatives to treatment. A timeout was performed prior to the initiation of the procedure. Initial ultrasound scanning demonstrates a large minimally complex though predominantly anechoic left-sided pleural effusion. The lower chest was prepped and draped in the usual sterile fashion. 1% lidocaine was used for local anesthesia. An ultrasound image was saved for documentation purposes. An 8 Fr Safe-T-Centesis catheter was introduced. The thoracentesis was performed. The catheter was removed and a dressing was applied. The patient tolerated the procedure well without immediate post procedural complication. The patient was escorted to have an upright chest radiograph. FINDINGS: A total of approximately 2.2 liters of serous fluid was removed. IMPRESSION: Successful ultrasound-guided left sided thoracentesis yielding 2.2 liters of pleural fluid. Electronically Signed   By: Sandi Mariscal M.D.   On: 03/27/2018 13:37     ASSESSMENT AND PLAN:   63 year old female patient with history of cirrhosis of liver currently under hospitalist service for dyspnea secondary to pleural effusion  Hepatic hydrothorax, had repeat paracentesis, thoracentesis on this admission, seen by gastroenterology, patient needs TIPS procedure to prevent recurrent accumulation and treatment of hepatic hydrothorax.    Recurrent  left pleural effusion, patient had thoracentesis 4 times this admission, last 1 was yesterday drained 2.2 L of fluid removed from left lung.  Cirrhosis of the liver, low MEL D score, needs a tips procedure which can be done at [facility], waiting for bed at [facility] discharge summary, spoke to hospitalist yesterday morning, discharge order also placed yesterday morning but patient is still here waiting for bed at [facility].  -History of depression Continue BuSpar and citalopram  -Cirrhosis of liver probably from  fatty liver disease, continue Lasix, Aldactone.  No evidence of spontaneous bacterial peritonitis, patient has hypotension today, adjust  the dose of Lasix, beta-blocker.  Aldactone, lisinopril has been stopped due to hyperkalemia Appreciate gastroenterology following.  Increased lymphocytes in pleural effusion likely reactive but flow cytometry, cytology, AFB are sent from thoracentesis fluid done yesterday. All the records are reviewed and case discussed with Care Management/Social Worker. Management plans discussed with the patient, family and they are in agreement.  CODE STATUS: DNR  DVT Prophylaxis: SCDs  TOTAL TIME TAKING CARE OF THIS PATIENT: 38 minutes.  More than 50% time spent in counseling, coordination of care.  Discussed the case with Dr. Rogue Bussing, ordered paracentesis tomorrow for cytology, flow cytometry.  Discussed with patient also. POSSIBLE D/C IN 2-3 DAYS, DEPENDING ON CLINICAL CONDITION.  Epifanio Lesches M.D on 03/28/2018 at 9:44 AM  Between 7am to 6pm - Pager - [phone]  After 6pm go to [email or web address] - password [email or web address] [place] [facility]  Office  [phone]  CC: Primary care physician; Marnee Guarneri, MD  Note: This dictation was prepared with Dragon dictation along with smaller phrase technology. Any transcriptional errors that result from this process are unintentional.

## 2018-03-28 NOTE — H&P (Signed)
History and Physical    Kelly Mclean BJY:[id number] DOB: January 22, 1956 DOA: 03/28/2018  PCP: Marnee Guarneri, MD  Patient coming from: [facility] transfer  I have personally briefly reviewed patient's old medical records in [place]  Chief Complaint: Recurrent hepatic hydrothorax  HPI: Kelly Mclean is a 63 y.o. female with medical history significant of HTN, liver cirrhosis.  Patient was admitted to [facility] due to SOB on Feb 21.  She was found to have recurrent Large L pleural effusion (had needed to have this drained previous to this hospitalization as well on 1/29, 2/13).  This hepatic hydrothorax was drained on 2/21, 2/26, 3/2 and 3/5, rapidly reoccurring after each drainage.  Patient has been transferred to [facility] for TIPS procedure.  At this time patient is breathing stable on 2L via [place], she suspects the pleural effusion has reoccured again though.  Review of Systems: As per HPI otherwise 10 point review of systems negative.   Past Medical History:  Diagnosis Date  . Anemia   . Asthma   . Cirrhosis ([place])   . Hypertension   . Pleural effusion     Past Surgical History:  Procedure Laterality Date  . TUBAL LIGATION       reports that she quit smoking about 30 years ago. She has never used smokeless tobacco. She reports previous alcohol use. She reports previous drug use.  Allergies  Allergen Reactions  . Biaxin [Clarithromycin] Shortness Of Breath and Rash  . Penicillins Anaphylaxis and Other (See Comments)    Did it involve swelling of the face/tongue/throat, SOB, or low BP? Unknown Did it involve sudden or severe rash/hives, skin peeling, or any reaction on the inside of your mouth or nose? Unknown Did you need to seek medical attention at a hospital or doctor's office? Yes When did it last happen?childhood If all above answers are "NO", may proceed with cephalosporin use.    Marland Kitchen Zoloft [Sertraline Hcl] Other (See Comments)    Reaction: "made crazy"  . Milk-Related  Compounds Diarrhea  . Lactose Intolerance (Gi)   . Other Other (See Comments)    Laughing gas pt turned gray,general anesthesia    Family History  Problem Relation Age of Onset  . Breast cancer Maternal Aunt 84     Prior to Admission medications   Medication Sig Start Date End Date Taking? Authorizing Provider  albuterol (PROVENTIL) (2.5 MG/3ML) 0.083% nebulizer solution Inhale 3 mLs (2.5 mg total) into the lungs every 6 (six) hours as needed for wheezing or shortness of breath. 03/27/18  Yes Epifanio Lesches, MD  busPIRone (BUSPAR) 10 MG tablet Take 10 mg by mouth 3 (three) times daily.   Yes [provider]  calcium carbonate (TUMS - DOSED IN MG ELEMENTAL CALCIUM) 500 MG chewable tablet Chew 2 tablets by mouth 2 (two) times daily.   Yes [provider]  citalopram (CELEXA) 20 MG tablet Take 20 mg by mouth daily.   Yes [provider]  docusate sodium (COLACE) 100 MG capsule Take 100 mg by mouth 2 (two) times daily.   Yes [provider]  famotidine (PEPCID) 40 MG tablet Take 40 mg by mouth at bedtime.   Yes [provider]  furosemide (LASIX) 20 MG tablet Take 1 tablet (20 mg total) by mouth daily. 03/27/18  Yes Epifanio Lesches, MD  gabapentin (NEURONTIN) 100 MG capsule Take 200 mg by mouth at bedtime.   Yes [provider]  primidone (MYSOLINE) 50 MG tablet Take 50 mg by mouth  2 (two) times daily.   Yes [provider]    Physical Exam: There were no vitals filed for this visit.  Constitutional: NAD, calm, comfortable Eyes: PERRL, lids and conjunctivae normal ENMT: Mucous membranes are moist. Posterior pharynx clear of any exudate or lesions.Normal dentition.  Neck: normal, supple, no masses, no thyromegaly Respiratory: Diminished on left Cardiovascular: Regular rate and rhythm, no murmurs / rubs / gallops. No extremity edema. 2+ pedal pulses. No carotid bruits.  Abdomen: Ascites Musculoskeletal: no clubbing /  cyanosis. No joint deformity upper and lower extremities. Good ROM, no contractures. Normal muscle tone.  Skin: no rashes, lesions, ulcers. No induration Neurologic: CN 2-12 grossly intact. Sensation intact, DTR normal. Strength 5/5 in all 4.  Psychiatric: Normal judgment and insight. Alert and oriented x 3. Normal mood.    Labs on Admission: I have personally reviewed following labs and imaging studies  CBC: Recent Labs  Lab 03/24/18 0458 03/26/18 2000  WBC 4.3 4.5  HGB 8.8* 9.1*  HCT 27.9* 28.7*  MCV 106.9* 106.7*  PLT 89* 86*   Basic Metabolic Panel: Recent Labs  Lab 03/22/18 0617 03/24/18 0458 03/25/18 1422 03/26/18 2000  NA 131* 134* 134* 136  K 5.1 5.5* 5.2* 5.4*  CL 104 107 105 105  CO2 23 24 24 23   GLUCOSE 88 79 107* 97  BUN 42* 42* 42* 39*  CREATININE 2.30* 2.37* 2.17* 2.21*  CALCIUM 8.5* 8.6* 8.6* 8.9   GFR: Estimated Creatinine Clearance: 24.3 mL/min (A) (by C-G formula based on SCr of 2.21 mg/dL (H)). Liver Function Tests: Recent Labs  Lab 03/26/18 2000  AST 43*  ALT 32  ALKPHOS 82  BILITOT 1.1  PROT 6.0*  ALBUMIN 2.6*   No results for input(s): LIPASE, AMYLASE in the last 168 hours. Recent Labs  Lab 03/26/18 2000  AMMONIA 28   Coagulation Profile: Recent Labs  Lab 03/26/18 2000  INR 1.4*   Cardiac Enzymes: No results for input(s): CKTOTAL, CKMB, CKMBINDEX, TROPONINI in the last 168 hours. BNP (last 3 results) No results for input(s): PROBNP in the last 8760 hours. HbA1C: No results for input(s): HGBA1C in the last 72 hours. CBG: No results for input(s): GLUCAP in the last 168 hours. Lipid Profile: No results for input(s): CHOL, HDL, LDLCALC, TRIG, CHOLHDL, LDLDIRECT in the last 72 hours. Thyroid Function Tests: No results for input(s): TSH, T4TOTAL, FREET4, T3FREE, THYROIDAB in the last 72 hours. Anemia Panel: No results for input(s): VITAMINB12, FOLATE, FERRITIN, TIBC, IRON, RETICCTPCT in the last 72 hours. Urine analysis: No  results found for: COLORURINE, APPEARANCEUR, LABSPEC, PHURINE, GLUCOSEU, HGBUR, BILIRUBINUR, KETONESUR, PROTEINUR, UROBILINOGEN, NITRITE, LEUKOCYTESUR  Radiological Exams on Admission: Dg Chest Port 1 View  Result Date: 03/27/2018 CLINICAL DATA:  History of hepatic hydrothorax post repeat large volume left-sided thoracentesis. EXAM: PORTABLE CHEST 1 VIEW COMPARISON:  Chest radiograph-03/26/2018 FINDINGS: Grossly unchanged cardiac silhouette and mediastinal contours. Interval reduction in persistent moderate size left-sided effusion post thoracentesis. No pneumothorax. Improved aeration of the left mid and lower lung with persistent left basilar heterogeneous/consolidative opacities. No new focal airspace opacities. No definite right-sided pleural effusion. No evidence of edema. No acute osseous abnormalities. IMPRESSION: 1. Interval reduction in persistent moderate size left-sided effusion post thoracentesis. No pneumothorax. 2. Improved aeration of the left lung base with persistent left basilar atelectasis. Electronically Signed   By: Sandi Mariscal M.D.   On: 03/27/2018 13:34   US Thoracentesis Asp Pleural Space W/img Guide  Result Date: 03/27/2018 INDICATION: History of left-sided hepatic  hydrothorax now with recurrent symptomatic left-sided pleural effusion. Please perform ultrasound-guided thoracentesis for therapeutic purposes. EXAM: US THORACENTESIS ASP PLEURAL SPACE W/IMG GUIDE COMPARISON:  Chest radiograph-03/26/2018 MEDICATIONS: None. COMPLICATIONS: None immediate. TECHNIQUE: Informed written consent was obtained from the patient after a discussion of the risks, benefits and alternatives to treatment. A timeout was performed prior to the initiation of the procedure. Initial ultrasound scanning demonstrates a large minimally complex though predominantly anechoic left-sided pleural effusion. The lower chest was prepped and draped in the usual sterile fashion. 1% lidocaine was used for local anesthesia.  An ultrasound image was saved for documentation purposes. An 8 Fr Safe-T-Centesis catheter was introduced. The thoracentesis was performed. The catheter was removed and a dressing was applied. The patient tolerated the procedure well without immediate post procedural complication. The patient was escorted to have an upright chest radiograph. FINDINGS: A total of approximately 2.2 liters of serous fluid was removed. IMPRESSION: Successful ultrasound-guided left sided thoracentesis yielding 2.2 liters of pleural fluid. Electronically Signed   By: Sandi Mariscal M.D.   On: 03/27/2018 13:37    EKG: Independently reviewed.  Assessment/Plan Principal Problem:   Hydrothorax Active Problems:   Ascites   Cirrhosis (HCC)   Acute kidney injury superimposed on chronic kidney disease ([place])    1. 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 Refractory to IR drainage (4 attempts during hospitalization) 2. Has already reoccurred since drainage yesterday. 3. Transferred to [facility] for TIPS procedure 4. NPO after MN 5. May also need drainage again with the TIPS procedure 2. Cirrhosis - 1. 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 Not currently drinking EtOH 3. Although she used to drink. 4. Continue lasix 71m daily for the moment. 5. Holding aldactone due to K 5.2 and 5.4 the past couple of days 6. CMP today is pending 3. AKI on CKD stage 3 - 1. Looks like baseline 1.6 (or was in Jan) 2. Has been running in the 2.2 range since hospital admission on 2/21. 3. Hepatorenal syndrome? 4. Also had been running hypotensive during hospital stay. 5[facility] 6. Strict intake and output  DVT prophylaxis: SCDs Code Status: DNR Family Communication: No family in room Disposition Plan: Home after admit Consults called: Order for IR Tips placed Admission status: Admit to inpatient  Severity of Illness: The appropriate patient status for this patient is INPATIENT. Inpatient status is judged  to be reasonable and necessary in order to provide the required intensity of service to ensure the patient's safety. The patient's presenting symptoms, physical exam findings, and initial radiographic and laboratory data in the context of their chronic comorbidities is felt to place them at high risk for further clinical deterioration. Furthermore, it is not anticipated that the patient will be medically stable for discharge from the hospital within 2 midnights of admission. The following factors support the patient status of inpatient.   Patient with decompensated cirrhosis, has failed multiple attempts to manage hepatic hydrothorax with drainage requiring drainage every couple of days now.  [facility]  Additionally patient has AKI on CKD, suspect hepatorenal syndrome.   * I certify that at the point of admission it is my clinical judgment that the patient will require inpatient hospital care spanning beyond 2 midnights from the point of admission due to high intensity of service, high risk for further deterioration and high frequency of surveillance required.*    Jobie Popp M. DO [facility]  How to contact the [facility]  or Consulting provider [place] or covering provider during after hours [place], for this patient?  1. Check the care team in [facility] and look for a) attending/consulting TR[facility] provider listed and b) the [facility] team listed 2. Log into [email or web address]  Amion Physician Scheduling and messaging for groups and whole hospitals  On call and physician scheduling software for group practices, residents, hospitalists and other medical providers for call, clinic, rotation and shift schedules. On[employer] is a hospital-wide system for scheduling doctors and paging doctors on call. EasyPlot is for scientific plotting and data analysis.  [email or web address]  and use [place]'s universal password to access. If you do not have the password, please contact  the hospital operator.  3. Locate the [facility] provider you are looking for under [facility] and page to a number that you can be directly reached. 4. If you still have difficulty reaching the provider, please page the [facility] (Director on Call) for the Hospitalists listed on amion for assistance.  03/28/2018, 9:40 PM

## 2018-03-28 NOTE — Progress Notes (Signed)
Patient arrived to unit at 1900. Placed patient on telemetry box 28. Performed skin assessment with night shift nurse. Checked chart to find no orders. Paged admitting Doctor.

## 2018-03-29 ENCOUNTER — Encounter (HOSPITAL_COMMUNITY): Payer: Self-pay | Admitting: *Deleted

## 2018-03-29 ENCOUNTER — Other Ambulatory Visit: Payer: Self-pay

## 2018-03-29 DIAGNOSIS — K769 Liver disease, unspecified: Secondary | ICD-10-CM

## 2018-03-29 DIAGNOSIS — J918 Pleural effusion in other conditions classified elsewhere: Secondary | ICD-10-CM

## 2018-03-29 LAB — URINALYSIS, ROUTINE W REFLEX MICROSCOPIC
Bilirubin Urine: NEGATIVE
Glucose, UA: NEGATIVE mg/dL
Ketones, ur: NEGATIVE mg/dL
Nitrite: NEGATIVE
Protein, ur: NEGATIVE mg/dL
SPECIFIC GRAVITY, URINE: 1.012 (ref 1.005–1.030)
pH: 5 (ref 5.0–8.0)

## 2018-03-29 LAB — SODIUM, URINE, RANDOM: Sodium, Ur: <10 mmol/L

## 2018-03-29 LAB — CREATININE, URINE, RANDOM: CREATININE, URINE: 145.28 mg/dL

## 2018-03-29 MED ORDER — SODIUM CHLORIDE 0.9 % IV SOLN
INTRAVENOUS | Status: DC
  Administered 2018-03-29: 22:00:00 via INTRAVENOUS

## 2018-03-29 MED ORDER — SODIUM CHLORIDE 0.9 % IV BOLUS
500.0000 mL | Freq: Once | INTRAVENOUS | Status: AC
  Administered 2018-03-29: 500 mL via INTRAVENOUS

## 2018-03-29 MED ORDER — ALBUMIN HUMAN 25 % IV SOLN
50.0000 g | Freq: Two times a day (BID) | INTRAVENOUS | Status: AC
  Administered 2018-03-29 – 2018-04-01 (×6): 50 g via INTRAVENOUS
  Filled 2018-03-29: qty 150
  Filled 2018-03-29: qty 50
  Filled 2018-03-29 (×2): qty 150
  Filled 2018-03-29: qty 50
  Filled 2018-03-29: qty 200
  Filled 2018-03-29: qty 50
  Filled 2018-03-29: qty 150
  Filled 2018-03-29: qty 200

## 2018-03-29 NOTE — Consult Note (Signed)
Chief Complaint: Patient was seen in consultation today for TIPS.  Referring Physician(s): Dr. Kevan Ny Gardner/Dr. Oren Binet  Supervising Physician: Daryll Brod  Patient Status: [facility] - In-pt  History of Present Illness: Kelly Mclean is a 63 y.o. 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 [name] was initially admitted to [facility] on 03/14/18 for acute hypoxic respiratory failure and large left pleural effusion. She has had a prolonged hospital course including thoracentesis on 2/21, 2/26, 3/2 for recurrent left hydrothorax, she also underwent nuclear medicine exam on 2/26 in IR which showed a connection between the peritoneal space and left pleural space suggestive of left hepatic hydrothorax. GI was consulted and followed patient throughout her stay at [facility], ultimately it was decided that this patient would likely benefit from TIPS procedure. Possible TIPS was discussed with Dr. Pascal Lux - at the time MELD score was 20. She was transferred to [facility] for formal consultation.  Patient seen today with Dr. Annamaria Boots, she is ordering lunch as she was NPO this morning due to possible procedure. She states she is aware that she is at [facility] now for the TIPS procedure and she has a general understanding of how the procedure is performed as well as the risks and benefits. She does not have any questions currently but her husband may have some questions when he arrives later today. Her only complaint is some shortness of breath and that she thinks she needs another thoracentesis soon.   Past Medical History:  Diagnosis Date  . Anemia   . Asthma   . Cirrhosis ([place])   . Hypertension   . Pleural effusion     Past Surgical History:  Procedure Laterality Date  . TUBAL LIGATION      Allergies: Biaxin [clarithromycin]; Penicillins; Zoloft  [sertraline hcl]; Milk-related compounds; Lactose intolerance (gi); and Other  Medications: Prior to Admission medications   Medication Sig Start Date End Date Taking? Authorizing Provider  albuterol (PROVENTIL) (2.5 MG/3ML) 0.083% nebulizer solution Inhale 3 mLs (2.5 mg total) into the lungs every 6 (six) hours as needed for wheezing or shortness of breath. 03/27/18  Yes Epifanio Lesches, MD  busPIRone (BUSPAR) 10 MG tablet Take 10 mg by mouth 3 (three) times daily.   Yes [provider]  calcium carbonate (TUMS - DOSED IN MG ELEMENTAL CALCIUM) 500 MG chewable tablet Chew 2 tablets by mouth 2 (two) times daily.   Yes [provider]  citalopram (CELEXA) 20 MG tablet Take 20 mg by mouth daily.   Yes [provider]  docusate sodium (COLACE) 100 MG capsule Take 100 mg by mouth 2 (two) times daily.   Yes [provider]  famotidine (PEPCID) 40 MG tablet Take 40 mg by mouth at bedtime.   Yes [provider]  furosemide (LASIX) 20 MG tablet Take 1 tablet (20 mg total) by mouth daily. 03/27/18  Yes Epifanio Lesches, MD  gabapentin (NEURONTIN) 100 MG capsule Take 200 mg by mouth at bedtime.   Yes [provider]  primidone (MYSOLINE) 50 MG tablet Take 50 mg by mouth 2 (two) times daily.   Yes [provider]     Family History  Problem Relation Age of Onset  . Breast cancer Maternal Aunt 84    Social History   Socioeconomic History  . Marital status: Married    Spouse name: Not on file  .  Number of children: Not on file  . Years of education: Not on file  . Highest education level: Not on file  Occupational History  . Not on file  Social Needs  . Financial resource strain: Not on file  . Food insecurity:    Worry: Not on file    Inability: Not on file  . Transportation needs:    Medical: Not on file    Non-medical: Not on file  Tobacco Use  . Smoking status: Former Smoker    Last attempt to quit: 1990    Years  since quitting: 30.2  . Smokeless tobacco: Never Used  Substance and Sexual Activity  . Alcohol use: Not Currently  . Drug use: Not Currently  . Sexual activity: Not Currently  Lifestyle  . Physical activity:    Days per week: Not on file    Minutes per session: Not on file  . Stress: Not on file  Relationships  . Social connections:    Talks on phone: Not on file    Gets together: Not on file    Attends religious service: Not on file    Active member of club or organization: Not on file    Attends meetings of clubs or organizations: Not on file    Relationship status: Not on file  Other Topics Concern  . Not on file  Social History Narrative   Patient moved from [place].  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  Lives with her husband in [place].  No alcohol abuse or smoking.     Review of Systems: A 12 point ROS discussed and pertinent positives are indicated in the HPI above.  All other systems are negative.  Review of Systems  Constitutional: Positive for fatigue. Negative for fever.  Respiratory: Positive for shortness of breath. Negative for cough.   Cardiovascular: Negative for chest pain.  Gastrointestinal: Negative for abdominal pain, blood in stool, diarrhea, nausea and vomiting.  All other systems reviewed and are negative.   Vital Signs: BP (!) 101/43 (BP Location: Left Arm)   Pulse 71   Temp 97.6 F (36.4 C) (Oral)   Resp 20   Ht 5' 2"  (1.575 m)   Wt 149 lb 14.6 oz (68 kg)   SpO2 95%   BMI 27.42 kg/m   Physical Exam Vitals signs and nursing note reviewed.  Constitutional:      General: She is not in acute distress. HENT:     Head: Normocephalic.  Eyes:     General: No scleral icterus. Cardiovascular:     Rate and Rhythm: Normal rate.  Pulmonary:     Effort: Pulmonary effort is normal.  Abdominal:     Palpations: Abdomen is soft.  Skin:    General: Skin is warm and dry.     Coloration: Skin is not jaundiced.  Neurological:     Mental Status:  She is alert and oriented to person, place, and time.  Psychiatric:        Mood and Affect: Mood normal.        Behavior: Behavior normal.        Thought Content: Thought content normal.        Judgment: Judgment normal.       Imaging: Ct Abdomen Pelvis Wo Contrast  Result Date: 03/20/2018 CLINICAL DATA:  History of cirrhosis of liver, dyspnea. Hepatic hydrothorax. Fluid with lymphocytes requiring rule out lymphoma. EXAM: CT ABDOMEN AND PELVIS WITHOUT CONTRAST TECHNIQUE: Multidetector CT imaging of the abdomen and  pelvis was performed following the standard protocol without IV contrast. COMPARISON:  CT abdomen dated 03/06/2018 FINDINGS: Lower chest: Again noted is a large LEFT pleural effusion, incompletely imaged. Lingular consolidation is likely associated atelectasis. Hepatobiliary: Cirrhotic appearing liver. Gallstones are present within the nondistended gallbladder. No bile duct dilatation seen. Pancreas: Unremarkable. No pancreatic ductal dilatation or surrounding inflammatory changes. Spleen: Normal in size without focal abnormality. Adrenals/Urinary Tract: Kidneys are unremarkable without mass, stone or hydronephrosis. No perinephric fluid. No ureteral or bladder calculi identified. Bladder is obscured by overlying ascites. Stomach/Bowel: No dilated large or small bowel loops. 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 Appendix is normal. Stomach is unremarkable. Vascular/Lymphatic: Aortic atherosclerosis. Enlarged lymph node adjacent to the pancreatic head, 1.6 cm, better seen on the recent contrast-enhanced CT of 03/06/2018. No other enlarged lymph nodes appreciated in the abdomen or pelvis. Reproductive: Uterus appears normal. Ascites obscures visualization of the bilateral adnexa. Other: Moderate amount of ascites within the abdomen and pelvis, largest component within the pelvis. No evidence of circumscribed fluid collection or abscess. No free intraperitoneal air. Musculoskeletal: No  acute or suspicious osseous finding. Superficial soft tissues are unremarkable. IMPRESSION: 1. Cirrhotic liver. 2. Moderate amount of ascites within the abdomen and pelvis, largest component within the pelvis. 3. Enlarged lymph node adjacent to the pancreatic head, measuring 1.6 cm, better seen on the recent contrast-enhanced CT of 03/06/2016, favor reactive over neoplastic etiology given the absence of additional lymphadenopathy. 4. Large LEFT pleural effusion, incompletely imaged. 5. Cholelithiasis without evidence of acute cholecystitis. Aortic Atherosclerosis (ICD10-I70.0). Electronically Signed   By: Franki Cabot M.D.   On: 03/20/2018 14:20   Dg Chest 1 View  Result Date: 03/26/2018 CLINICAL DATA:  Shortness of breath. EXAM: CHEST  1 VIEW COMPARISON:  Multiple prior exams most recent radiograph 03/24/2018. Most recent CT 03/14/2018 FINDINGS: Known left pleural effusion has increased in size over the past 2 days, now large in size. Associated compressive atelectasis throughout the left lung. Mild rightward mediastinal shift versus rotation. Only a small portion of aerated left perihilar lung. Hypoventilatory right lung without acute finding. No visualized pneumothorax. IMPRESSION: Increased size of known left pleural effusion, now large in size. This causes mild mass effect in the mediastinum with rightward mediastinal shift. Electronically Signed   By: Keith Rake M.D.   On: 03/26/2018 19:37   Dg Chest 1 View  Result Date: 03/14/2018 CLINICAL DATA:  Status post thoracentesis EXAM: CHEST  1 VIEW COMPARISON:  March 14, 2018 12:56 p.m. FINDINGS: The heart size and mediastinal contours are within normal limits. There is small left pleural effusion significantly decreased compared prior exam. Consolidation left lung base is noted. There is no pneumothorax. The right lung is clear. The visualized skeletal structures are stable. IMPRESSION: There is small left pleural effusion, significantly decreased  compared prior exam. Consolidation of left lung base is identified. There is no pneumothorax. Electronically Signed   By: Abelardo Diesel M.D.   On: 03/14/2018 16:19   Ct Chest Wo Contrast  Result Date: 03/14/2018 CLINICAL DATA:  Pleural effusion and dyspnea since yesterday. EXAM: CT CHEST WITHOUT CONTRAST TECHNIQUE: Multidetector CT imaging of the chest was performed following the standard protocol without IV contrast. COMPARISON:  03/14/2018 CXR FINDINGS: Cardiovascular: Common arterial branch of the right brachiocephalic left common carotid arteries. Atherosclerosis of left subclavian artery origin. Nonaneurysmal atherosclerotic aorta. The unenhanced pulmonary vessels are unremarkable. The heart size is normal with small anterior pericardial effusion without thickening noted. Mediastinum/Nodes: Small subcentimeter prevascular and paratracheal  lymph nodes. 1 cm short axis subcarinal lymph node. Patent trachea and mainstem bronchi. Unremarkable CT appearance of the esophagus. The thyroid gland is unremarkable without dominant mass. Lungs/Pleura: Moderate to large layering left effusion with adjacent atelectasis. The right lung is clear. No pneumothorax or dominant mass. Upper Abdomen: Cirrhotic appearance of the liver with moderate to large volume of ascites. Musculoskeletal: No chest wall mass or suspicious bone lesions identified. Mild thoracic spondylosis. IMPRESSION: 1. Moderate to large layering left pleural effusion with adjacent atelectasis. 2. Cirrhotic liver with moderate to large volume of ascites. Aortic Atherosclerosis (ICD10-I70.0). Electronically Signed   By: Ashley Royalty M.D.   On: 03/14/2018 22:17   US Abdomen Complete  Result Date: 03/04/2018 CLINICAL DATA:  63 year old with hepatic cirrhosis. EXAM: ABDOMEN ULTRASOUND COMPLETE COMPARISON:  Visualized upper abdomen on CT chest 02/18/2018. FINDINGS: Gallbladder: Numerous shadowing gallstones, the largest measuring approximately 9 mm. Echogenic  sludge. Mild gallbladder wall thickening up to approximately 4 mm. No pericholecystic fluid. Negative sonographic Murphy's sign according to the ultrasound technologist. Common bile duct: Diameter: Approximately 4 mm. No visible bile duct stones. Liver: Diffusely coarsened echotexture and irregular hepatic contour. No focal hepatic parenchymal abnormalities. Mildly increased echotexture diffusely. Portal vein is patent on color Doppler imaging with normal direction of blood flow towards the liver. IVC: Patent. Pancreas: Hypoechoic mass adjacent to or involving the pancreatic head or porta hepatis lymph node measuring approximately 2.4 x 1.4 x 2.8 cm. Visualized pancreas otherwise unremarkable. The tail is obscured by overlying bowel gas. The pancreas was not included on the recent CT chest. Spleen: Enlarged, measuring approximately 15.8 x 15.8 x 5.2 cm, giving a volume of approximately 681 mL. No focal parenchymal abnormality. Right Kidney: Length: Approximately 9.0 cm. No hydronephrosis. Well-preserved cortex. Mildly echogenic parenchyma. Approximate 1.2 x 1.1 x 2.0 cm mildly complex cyst with a thin internal septation involving the UPPER pole of the RIGHT kidney. No solid renal mass. Left Kidney: Length: Approximately 8.2 cm. No hydronephrosis. Well-preserved cortex. Mildly echogenic parenchyma. No focal parenchymal abnormalities. Abdominal aorta: Normal in caliber throughout its visualized course in the abdomen with evidence of atherosclerosis. Maximum diameter 2.4 cm. Other findings: Small amount of ascites. Large LEFT pleural effusion. IMPRESSION: 1. Hepatic cirrhosis.  No focal hepatic parenchymal abnormality. 2. Hypoechoic mass adjacent to or involving the pancreatic head versus enlarged porta hepatis lymph node. The pancreas was not included on the recent CT chest, so a CT of the abdomen and pelvis with contrast is recommended in further evaluation. 3. Cholelithiasis.  No sonographic evidence of acute  cholecystitis. 4. Splenomegaly without focal splenic parenchymal abnormality, indicating portal hypertension. 5. Patent portal vein with normal antegrade hepatopetal flow. 6. Small amount of ascites. 7. Large LEFT pleural effusion. Electronically Signed   By: Evangeline Dakin M.D.   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 Plan for nuclear medicine ascites - hydrothorax examination. Plan for an ultrasound-guided paracentesis and injection of radiopharmaceutical into the ascites. Left thoracentesis was performed prior to this procedure. EXAM: ULTRASOUND GUIDED PARACENTESIS INJECTION OF RADIOPHARMACEUTICAL INTO ASCITES MEDICATIONS: None. COMPLICATIONS: None immediate. PROCEDURE: Informed written consent was obtained from the patient after a discussion of the risks, benefits and alternatives to treatment. A timeout was performed prior to the initiation of the procedure. Initial ultrasound scanning demonstrates a small amount of ascites within the right upper abdominal quadrant. The right upper abdomen was prepped and draped in the usual sterile fashion.  1% lidocaine with was used for local anesthesia. Following this, a 6 Fr Safe-T-Centesis catheter was introduced. An ultrasound image was saved for documentation purposes. The paracentesis was performed. 60 mL of opaque yellow fluid was removed. The radiopharmaceutical was injected through the Safe-T-Centesis catheter. Catheter was flushed with sterile saline. The catheter was removed and a dressing was applied. The patient tolerated the procedure well without immediate post procedural complication. FINDINGS: Small amount of fluid around the liver. Small amount of fluid in the left lower quadrant. 60 mL of fluid was removed from the right upper quadrant. The radiopharmaceutical was injected into the right upper quadrant. IMPRESSION: Successful ultrasound-guided  paracentesis yielding 60 mL of ascites. Fluid was sent for labs. Radiopharmaceutical was injected through the paracentesis catheter for the nuclear medicine examination. Electronically Signed   By: Markus Daft M.D.   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 Patient is being evaluated for a hepatic hydrothorax. EXAM: NUCLEAR MEDICINE SULFUR COLLOID PERITONEAL SCINTIGRAPHY TECHNIQUE: Ultrasound-guided left thoracentesis was performed. 1.1 L of left pleural fluid was removed but not all of the fluid was removed. Subsequently, an ultrasound-guided paracentesis was performed in the right upper quadrant. 60 mL of peritoneal fluid was removed. Technetium 99 M sulfur colloid was injected through the paracentesis catheter. Paracentesis catheter was removed. RADIOPHARMACEUTICALS:  7 millicuries technetium 99 M sulfur colloid COMPARISON:  Chest CT 03/14/2018 FINDINGS: Imaging was obtained over 1 hour. The initial images demonstrated radiopharmaceutical throughout the peritoneal cavity. The largest peritoneal pockets are in the right upper quadrant and left lower quadrant. Over time, radiopharmaceutical starts to accumulate in the left hemithorax. Findings are compatible with a trans diaphragmatic connection between the peritoneal cavity and the left pleural space. IMPRESSION: Study is positive for connection between the peritoneal space and left pleural space. 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 Electronically Signed   By: Markus Daft M.D.   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 Plan for nuclear medicine ascites - hydrothorax examination. Plan for an ultrasound-guided paracentesis and injection of radiopharmaceutical into the ascites. Left thoracentesis was performed prior  to this procedure. EXAM: ULTRASOUND GUIDED PARACENTESIS INJECTION OF RADIOPHARMACEUTICAL INTO ASCITES MEDICATIONS: None. COMPLICATIONS: None immediate. PROCEDURE: Informed written consent was obtained from the patient after a discussion of the risks, benefits and alternatives to treatment. A timeout was performed prior to the initiation of the procedure. Initial ultrasound scanning demonstrates a small amount of ascites within the right upper abdominal quadrant. The right upper abdomen was prepped and draped in the usual sterile fashion. 1% lidocaine with was used for local anesthesia. Following this, a 6 Fr Safe-T-Centesis catheter was introduced. An ultrasound image was saved for documentation purposes. The paracentesis was performed. 60 mL of opaque yellow fluid was removed. The radiopharmaceutical was injected through the Safe-T-Centesis catheter. Catheter was flushed with sterile saline. The catheter was removed and a dressing was applied. The patient tolerated the procedure well without immediate post procedural complication. FINDINGS: Small amount of fluid around the liver. Small amount of fluid in the left lower quadrant. 60 mL of fluid was removed from the right upper quadrant. The radiopharmaceutical was injected into the right upper quadrant. IMPRESSION: Successful ultrasound-guided paracentesis yielding 60 mL of ascites. Fluid was sent for labs. Radiopharmaceutical was injected through the paracentesis catheter for the  nuclear medicine examination. Electronically Signed   By: Markus Daft M.D.   On: 03/19/2018 17:06   Ct Abdomen W Wo Contrast  Result Date: 03/06/2018 CLINICAL DATA:  Pancreatic mass seen on earlier [place]. Pancreatic mass workup . Cirrhosis. Epigastric and LUQ pain. 36m of Omni 300 used. Pt could only tolerate laying on her left side for scan.^10105mOMNIPAQUE IOHEXOL 300 MG/ML SOLNPancreatic mass EXAM: CT ABDOMEN WITHOUT AND WITH CONTRAST TECHNIQUE: Multidetector CT imaging of the abdomen  was performed following the standard protocol before and following the bolus administration of intravenous contrast. CONTRAST:  10028mMNIPAQUE IOHEXOL 300 MG/ML  SOLN COMPARISON:  Ultrasound 03/04/2010 FINDINGS: Lower chest:  Large LEFT pleural effusion with passive atelectasis. Hepatobiliary: Nodular liver. Caudate lobe is enlarged. Portal veins are patent. Multiple gallstones within lumen gallbladder. No enhancing hepatic lesion. No intrahepatic or extrahepatic biliary duct dilatation. Pancreas: No abnormality of the pancreatic head body or tail. No duct dilatation. Periampullary duodenum diverticulum noted. Lymph node position inferior to the pancreas adjacent to the portal vein measures 16 mm (image 49/4) Spleen: Spleen borderline enlarged. Adrenals/urinary tract: LEFT kidney is normal. RIGHT kidney normal. No adrenal abnormality Stomach/Bowel: Stomach and limited of the small bowel is unremarkable Vascular/Lymphatic: Abdominal aortic normal caliber. No retroperitoneal periportal lymphadenopathy. Musculoskeletal: No aggressive osseous lesion IMPRESSION: 1. No pancreatic lesion identified. 2. Peripancreatic lymph node is mildly enlarged. 3. Small periampullary duodenum diverticulum present. 4. Morphologic changes in liver consistent with cirrhosis. No biliary duct dilatation of the intrahepatic or extrahepatic bile ducts. 5. No enhancing hepatic lesion. 6. Cholelithiasis. 7. Moderate volume of intraperitoneal free fluid. 8. Large LEFT pleural effusion Electronically Signed   By: SteSuzy BouchardD.   On: 03/06/2018 16:31   Dg Bone Density (dxa)  Result Date: 03/11/2018 EXAM: DUAL X-RAY ABSORPTIOMETRY (DXA) FOR BONE MINERAL DENSITY IMPRESSION: Dear Dr, RosLutricia Feilour patient LesOnelia Cadmusmpleted a FRAX assessment on 03/11/2018 using the LunSalt Lakenalysis version: 14.10) manufactured by [employer] following summarizes the results of our evaluation. PATIENT BIOGRAPHICAL: Name:  LapLiana, Camerertient ID: [id number] Date: 02/1955-05-29ight:    62.5 in. Gender:     Female    [age]       Weight:    158.4 lbs. Ethnicity:  White                            Exam Date: 03/11/2018 FRAX* RESULTS:  (version: 3.5) 10-year Probability of Fracture1 Major Osteoporotic Fracture2 Hip Fracture 8.1% 0.7% Population: [place]) Risk Factors: None Based on Femur (Left) Neck BMD 1 -The 10-year probability of fracture may be lower than reported if the patient has received treatment. 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 The probability of a hip fracture is 0.7% within the next ten years. . Technologist: SCE PATIENT BIOGRAPHICAL: Name: LapLawanda, Holzheimertient ID: [id number] Date: [birth year]/59ight: 64.5 in. Gender: Female Exam Date: 03/11/2018 Weight: 158.4 lbs. Indications: Asthma, Caucasian, Cirrhosis, Early Menopause, Postmenopausal, Previous Smoker Fractures: Treatments: Gabapentin, primidone ASSESSMENT: The BMD measured at Femur Neck Left is 0.833 g/cm2 with a T-score of -1.5. This patient is considered osteopenic according to [facility]  criteria. The quality of the scan is good. Site Region Measured Measured WHO Young Adult BMD Date       Age      Classification T-score AP Spine L1-L4 03/11/2018 [age] Osteopenia -1.5 1.007 g/cm2 DualFemur Neck Left 03/11/2018 61.0 Osteopenia -1.5 0.833 g/cm2 World Health Organization [facility]) criteria for post-menopausal, Caucasian Women: Normal:       T-score at or above -1 SD Osteopenia:   T-score between -1 and -2.5 SD Osteoporosis: T-score at or below -2.5 SD RECOMMENDATIONS: 1. All patients should optimize calcium and vitamin D intake.  2. Consider FDA-approved medical therapies in postmenopausal women and men aged 93 years and older, based on the following: a. A hip or vertebral(clinical or morphometric) fracture b. T-score < -2.5 at the femoral neck or spine after appropriate evaluation to exclude secondary causes c. Low bone mass (T-score between -1.0 and -2.5 at the femoral neck or spine) and a 10-year probability of a hip fracture > 3% or a 10-year probability of a major osteoporosis-related fracture > 20% based on the [place]-adapted WHO algorithm d. Clinician judgment and/or patient preferences may indicate treatment for people with 10-year fracture probabilities above or below these levels FOLLOW-UP: People with diagnosed cases of osteoporosis or at high risk for fracture should have regular bone mineral density tests. For patients eligible for Medicare, routine testing is allowed once every 2 years. The testing frequency can be increased to one year for patients who have rapidly progressing disease, those who are receiving or discontinuing medical therapy to restore bone mass, or have additional risk factors. I have reviewed this report, and agree with the above findings. [facility] Electronically Signed   By: Lowella Grip III M.D.   On: 03/11/2018 11:23   Dg Chest Port 1 View  Result Date: 03/28/2018 CLINICAL DATA:  Recurrent effusion EXAM: PORTABLE CHEST 1 VIEW COMPARISON:  03/27/2018, 03/26/2018, 03/24/2018 FINDINGS: Interval diffuse hazy opacity left thorax likely due to layering effusion, moderate in size. This is increased compared to prior. Underlying edema not excluded. Stable cardiomediastinal silhouette. Left basilar airspace disease. IMPRESSION: Increased moderate left pleural effusion with development of diffuse hazy opacity in the left thorax which may reflect layering fluid and or edema. Increased airspace disease at the left base. Electronically Signed   By: Donavan Foil M.D.   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 EXAM: PORTABLE CHEST 1 VIEW COMPARISON:  Chest radiograph-03/26/2018 FINDINGS: Grossly unchanged cardiac silhouette and mediastinal contours. Interval reduction in persistent moderate size left-sided effusion post thoracentesis. No pneumothorax. Improved aeration of the left mid and lower lung with persistent left basilar heterogeneous/consolidative opacities. No new focal airspace opacities. No definite right-sided pleural effusion. No evidence of edema. No acute osseous abnormalities. IMPRESSION: 1. Interval reduction in persistent moderate size left-sided effusion post thoracentesis. No pneumothorax. 2. Improved aeration of the left lung base with persistent left basilar atelectasis. Electronically Signed   By: Sandi Mariscal M.D.   On: 03/27/2018 13:34   Dg Chest Port 1 View  Result Date: 03/24/2018 CLINICAL DATA:  Post left thoracentesis EXAM: PORTABLE CHEST 1 VIEW COMPARISON:  03/21/2018 FINDINGS: Moderate to large left pleural effusion minimally changed since prior study. No pneumothorax. Left basilar opacity, likely atelectasis. No confluent opacity on the right. Heart is upper limits normal in size. IMPRESSION: Moderate to large-sized left pleural effusion minimally changed. No pneumothorax. Continued left  base atelectasis or infiltrate. Electronically Signed   By: Rolm Baptise M.D.   On: 03/24/2018 12:46   Dg Chest Port 1 View  Result Date: 03/19/2018 CLINICAL DATA:  Status post thoracentesis. EXAM: PORTABLE CHEST 1 VIEW COMPARISON:  03/14/2018 FINDINGS: 1702 hours. Moderate to large left pleural effusion noted without evidence for pneumothorax. Left base collapse/consolidation. The cardio pericardial silhouette is enlarged. The visualized bony structures of the thorax are intact. Telemetry leads overlie the chest. IMPRESSION: 1. No evidence for pneumothorax status  post thoracentesis. 2. Moderate to large left pleural effusion. Electronically Signed   By: Misty Stanley M.D.   On: 03/19/2018 18:56   Dg Chest Portable 1 View  Result Date: 03/14/2018 CLINICAL DATA:  Shortness of breath. EXAM: PORTABLE CHEST 1 VIEW COMPARISON:  Chest x-ray 03/06/2018. FINDINGS: Mediastinum and hilar structures normal. Large left pleural effusion, increased in size from prior exam. Underlying left lung atelectasis/infiltrate can not be excluded. No pneumothorax. Heart size most likely stable. IMPRESSION: Large left pleural effusion, increased from prior exam. Electronically Signed   By: Marcello Moores  Register   On: 03/14/2018 13:19   Dg Chest Port 1 View  Result Date: 03/06/2018 CLINICAL DATA:  Post large volume left-sided thoracentesis. EXAM: PORTABLE CHEST 1 VIEW COMPARISON:  Chest radiograph-02/20/2018; CT abdomen pelvis-earlier same day FINDINGS: Interval reduction in persistent moderate size left-sided effusion post large volume thoracentesis. No pneumothorax. Improved aeration of left lung base with persistent left basilar heterogeneous/consolidative opacities. No definite evidence of right-sided pleural effusion. No new focal airspace opacities. Unchanged cardiac silhouette and mediastinal contours. No acute osseous abnormalities. IMPRESSION: Interval reduction in persistent moderate sized left-sided effusion post thoracentesis. No pneumothorax. Electronically Signed   By: Sandi Mariscal M.D.   On: 03/06/2018 16:02   Mm 3d Screen Breast Bilateral  Result Date: 03/12/2018 CLINICAL DATA:  Screening. EXAM: DIGITAL SCREENING BILATERAL MAMMOGRAM WITH TOMO AND CAD COMPARISON:  Previous exam(s). ACR Breast Density Category b: There are scattered areas of fibroglandular density. FINDINGS: There are no findings suspicious for malignancy. Images were processed with CAD. IMPRESSION: No mammographic evidence of malignancy. A result letter of this screening mammogram will be mailed directly to the  patient. RECOMMENDATION: Screening mammogram in one year. (Code:SM-B-01Y) BI-RADS CATEGORY  1: Negative. Electronically Signed   By: Dorise Bullion III M.D   On: 03/12/2018 14:44   Nm Hepato Biliary Leak  Result Date: 03/17/2018 CLINICAL DATA:  Cholelithiasis. Nausea and vomiting. Hepatic cirrhosis. EXAM: NUCLEAR MEDICINE HEPATOBILIARY IMAGING TECHNIQUE: Sequential images of the abdomen were obtained out to 60 minutes following intravenous administration of radiopharmaceutical. RADIOPHARMACEUTICALS:  5.1 mCi Tc-73m Choletec IV COMPARISON:  CT on 03/14/2018 FINDINGS: Delayed uptake and biliary excretion of activity by the liver is seen, consistent with known hepatic cirrhosis. Gallbladder activity is visualized, consistent with patency of cystic duct. Biliary activity passes into small bowel, consistent with patent common bile duct. IMPRESSION: No evidence of acute cholecystitis or biliary ductal dilatation. Hepatocellular dysfunction, consistent with hepatic cirrhosis. Electronically Signed   By: JEarle GellM.D.   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 Please perform ultrasound-guided thoracentesis for therapeutic purposes. EXAM: [place]OR[place] ASP PLEURAL SPACE W/IMG GUIDE COMPARISON:  Chest radiograph-03/26/2018 MEDICATIONS: None. COMPLICATIONS: None immediate. TECHNIQUE: Informed written consent was obtained from the patient after a discussion of the risks, benefits and alternatives to treatment. A timeout was performed prior  to the initiation of the procedure. Initial ultrasound scanning demonstrates a large minimally complex though predominantly anechoic left-sided pleural effusion. The lower chest was prepped and draped in the usual sterile fashion. 1% lidocaine was used for local anesthesia. An ultrasound image was saved for  documentation purposes. An 8 Fr Safe-T-Centesis catheter was introduced. The thoracentesis was performed. The catheter was removed and a dressing was applied. The patient tolerated the procedure well without immediate post procedural complication. The patient was escorted to have an upright chest radiograph. FINDINGS: A total of approximately 2.2 liters of serous fluid was removed. IMPRESSION: Successful ultrasound-guided left sided thoracentesis yielding 2.2 liters of pleural fluid. Electronically Signed   By: Sandi Mariscal M.D.   On: 03/27/2018 13:37   US Thoracentesis Asp Pleural Space W/img Guide  Result Date: 03/24/2018 INDICATION: Patient with history of hepatic hydrothorax, cirrhosis and recurrent left pleural effusion who presents today originally for paracentesis in order to obtain flow cytometry to r/o malignancy prior to possible TIPS procedure. Limited abdominal US shows scant peritoneal fluid not amenable to percutaneous drainage. Diagnostic and therapeutic thoracentesis performed in order to obtain requested labs in lieu of paracentesis given patient's history of hepatic hydrothorax. EXAM: ULTRASOUND GUIDED LEFT THORACENTESIS MEDICATIONS: 20 mL 1% lidocaine. COMPLICATIONS: None immediate. PROCEDURE: An ultrasound guided thoracentesis was thoroughly discussed with the patient and questions answered. The benefits, risks, alternatives and complications were also discussed. The patient understands and wishes to proceed with the procedure. Written consent was obtained. Ultrasound was performed to localize and mark an adequate pocket of fluid in the left chest. The area was then prepped and draped in the normal sterile fashion. 1% Lidocaine was used for local anesthesia. Under ultrasound guidance a 6 Fr Safe-T-Centesis catheter was introduced. Thoracentesis was performed. The catheter was removed and a dressing applied. FINDINGS: A total of approximately 1.1 L of pink milky fluid was removed. Samples were  sent to the laboratory as requested by the clinical team. IMPRESSION: Successful ultrasound guided left thoracentesis yielding 1.1 L of pleural fluid. Read by Candiss Norse, PA-C Electronically Signed   By: Lucrezia Europe M.D.   On: 03/24/2018 12:49   US Thoracentesis Asp Pleural Space W/img Guide  Result Date: 03/19/2018 INDICATION: 63 year old with cirrhosis and recurrent left hydrothorax. Patient is being evaluated for a connection between the peritoneal and left pleural space with a nuclear medicine examination. Patient has a large amount of left pleural fluid on ultrasound examination. Plan to remove some of the left pleural fluid prior to the nuclear medicine examination. EXAM: ULTRASOUND GUIDED LEFT THORACENTESIS MEDICATIONS: None. COMPLICATIONS: None immediate. PROCEDURE: An ultrasound guided thoracentesis was thoroughly discussed with the patient and questions answered. The benefits, risks, alternatives and complications were also discussed. The patient understands and wishes to proceed with the procedure. Written consent was obtained. Ultrasound was performed to localize and mark an adequate pocket of fluid in the left chest. The area was then prepped and draped in the normal sterile fashion. 1% Lidocaine was used for local anesthesia. Under ultrasound guidance a 6 Fr Safe-T-Centesis catheter was introduced. Thoracentesis was performed. The catheter was removed and a dressing applied. FINDINGS: A total of approximately 1.1 L of opaque yellow fluid was removed. Samples were sent to the laboratory as requested by the clinical team. IMPRESSION: Successful ultrasound guided left thoracentesis yielding 1.1 L of pleural fluid. Electronically Signed   By: Markus Daft M.D.   On: 03/19/2018 17:11   US Thoracentesis Asp Pleural Space W/img Guide  Result Date: 03/14/2018 CLINICAL DATA:  Recurrent left pleural effusion. EXAM: ULTRASOUND GUIDED LEFT THORACENTESIS COMPARISON:  None. PROCEDURE: An ultrasound  guided thoracentesis was thoroughly discussed with the patient and questions answered. The benefits, risks, alternatives and complications were also discussed. The patient understands and wishes to proceed with the procedure. Written consent was obtained. Ultrasound was performed to localize and mark an adequate pocket of fluid in the left chest. The area was then prepped and draped in the normal sterile fashion. 1% Lidocaine was used for local anesthesia. Under ultrasound guidance a 6 French Safe-T-Centesis catheter was introduced. Thoracentesis was performed. The catheter was removed and a dressing applied. COMPLICATIONS: None FINDINGS: A total of approximately 2.3 L of cloudy, yellowish-brown fluid was removed. A fluid sample was sent for laboratory analysis. IMPRESSION: Successful ultrasound guided left thoracentesis yielding 2.3 L of pleural fluid. Electronically Signed   By: Aletta Edouard M.D.   On: 03/14/2018 16:13   US Thoracentesis Asp Pleural Space W/img Guide  Result Date: 03/06/2018 INDICATION: History of cirrhosis now with recurrent symptomatic left-sided pleural effusion worrisome for hepatic hydrothorax. Please from ultrasound-guided thoracentesis for therapeutic purposes. EXAM: US THORACENTESIS ASP PLEURAL SPACE W/IMG GUIDE COMPARISON:  Ultrasound-guided thoracentesis-03/22/2018 (yielding 2.2 L of pleural fluid); CT abdomen pelvis-earlier same day MEDICATIONS: None. COMPLICATIONS: None immediate. TECHNIQUE: Informed written consent was obtained from the patient after a discussion of the risks, benefits and alternatives to treatment. A timeout was performed prior to the initiation of the procedure. Initial ultrasound scanning demonstrates a large anechoic left-sided pleural effusion. The lower chest was prepped and draped in the usual sterile fashion. 1% lidocaine was used for local anesthesia. An ultrasound image was saved for documentation purposes. An 8 Fr Safe-T-Centesis catheter was  introduced. The thoracentesis was performed. Despite residual fluid within the left pleural space, the patient wished to terminate the procedure given left-sided chest pain. As such, the catheter was removed and a dressing was applied. The patient tolerated the procedure well without immediate post procedural complication. The patient was escorted to have an upright chest radiograph. FINDINGS: A total of approximately 1.9 liters of amber colored serous fluid was removed. IMPRESSION: Successful ultrasound-guided left sided thoracentesis yielding 1.9 liters of pleural fluid. Electronically Signed   By: Sandi Mariscal M.D.   On: 03/06/2018 16:04    Labs:  CBC: Recent Labs    03/21/18 0507 03/24/18 0458 03/26/18 2000 03/28/18 2130  WBC 5.4 4.3 4.5 4.9  HGB 9.2* 8.8* 9.1* 9.1*  HCT 28.2* 27.9* 28.7* 27.3*  PLT 96* 89* 86* 81*    COAGS: Recent Labs    03/14/18 1301 03/26/18 2000  INR 1.33 1.4*  APTT 32  --     BMP: Recent Labs    03/24/18 0458 03/25/18 1422 03/26/18 2000 03/28/18 2130  NA 134* 134* 136 133*  K 5.5* 5.2* 5.4* 5.1  CL 107 105 105 103  CO2 24 24 23 23   GLUCOSE 79 107* 97 90  BUN 42* 42* 39* 45*  CALCIUM 8.6* 8.6* 8.9 8.8*  CREATININE 2.37* 2.17* 2.21* 2.32*  GFRNONAA 21* 24* 23* 22*  GFRAA 25* 28* 27* 25*    LIVER FUNCTION TESTS: Recent Labs    03/15/18 0451 03/17/18 0502 03/26/18 2000 03/28/18 2130  BILITOT 1.8* 2.0* 1.1 1.2  AST 29 33 43* 43*  ALT 20 22 32 31  ALKPHOS 51 67 82 87  PROT 5.5* 5.8* 6.0* 5.9*  ALBUMIN 2.4* 2.6* 2.6* 2.3*    TUMOR MARKERS: No results  for input(s): AFPTM, CEA, CA199, CHROMGRNA in the last 8760 hours.  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   Patient seen by myself and Dr. Annamaria Boots today - she is overall stable and does not require  emergent TIPS at this time. Upon review of her latest lab work her MELD has increased to 22, ideally this should be <20 prior to TIPS. MELD has increased due to increased creatinine, now 2.32 (previously ~1.6 at baseline).   Given patient does not require an urgent procedure we will consult nephrology to assist with possibly improving creatinine prior to TIPS in order to reduce morbidity/mortality as much as possible. We will continue to follow patient and plan for TIPS likely early next week.  We will also plan for left therapeutic thoracentesis tomorrow in [place].  Thank you for this interesting consult.  I greatly enjoyed meeting Hollie Wojahn and look forward to participating in their care.  A copy of this report was sent to the requesting provider on this date.  Electronically Signed: Joaquim Nam, PA-C 03/29/2018, 1:09 PM   I spent a total of 20 Minutes  in face to face in clinical consultation, greater than 50% of which was counseling/coordinating care for TIPS.

## 2018-03-29 NOTE — Consult Note (Signed)
Renal Service Consult Note [facility]  Ahmoni Edge 03/29/2018 Sol Blazing Requesting Physician:  Dr Sloan Leiter, Chauncey Cruel.   Reason for Consult:  Renal failure in cirrhotic HPI: The patient is a 63 y.o. year-old w/ recent onset ascites and L pleural effusions, hx of "scarred liver" years ago.  Recently diagnosed w/ ascites and hepatic hydrothorax treated w/ thoracentesis and paracentesis in the last few months.  She was at [facility] and had resurgence of the L effusion shortly after drainage and was sent to [facility] 3/6 for consideration of TIPS procedure.  Her creat in Jan was 1.6, in Feb was 1.6- 2.5 and now in March is 2.2.  IR would like to see if renal function can be improved before doing TIPS.  Asked to see in this regard.   Patient has no specific c/o's, poor historian.  Lives w/ her husband in [place], [place]. Uses a rollerator at home to get around.  No tob/ etoh.  No nsaids / OTC meds. No voiding issues.   ROS  denies CP  no joint pain   no HA  no blurry vision  no rash  no diarrhea  no nausea/ vomiting  no dysuria  no difficulty voiding  no change in urine color    Past Medical History  Past Medical History:  Diagnosis Date  . Anemia   . Asthma   . Cirrhosis ([place])   . Hypertension   . Pleural effusion    Past Surgical History  Past Surgical History:  Procedure Laterality Date  . TUBAL LIGATION     Family History  Family History  Problem Relation Age of Onset  . Breast cancer Maternal Aunt 84   Social History  reports that she quit smoking about 30 years ago. She has never used smokeless tobacco. She reports previous alcohol use. She reports previous drug use. Allergies  Allergies  Allergen Reactions  . Biaxin [Clarithromycin] Shortness Of Breath and Rash  . Penicillins Anaphylaxis and Other (See Comments)    Did it involve swelling of the face/tongue/throat, SOB, or low BP? Unknown Did it involve sudden or severe rash/hives, skin peeling, or any reaction  on the inside of your mouth or nose? Unknown Did you need to seek medical attention at a hospital or doctor's office? Yes When did it last happen?childhood If all above answers are "NO", may proceed with cephalosporin use.    Marland Kitchen Zoloft [Sertraline Hcl] Other (See Comments)    Reaction: "made crazy"  . Milk-Related Compounds Diarrhea  . Lactose Intolerance (Gi)   . Other Other (See Comments)    Laughing gas pt turned gray,general anesthesia   Home medications Prior to Admission medications   Medication Sig Start Date End Date Taking? Authorizing Provider  albuterol (PROVENTIL) (2.5 MG/3ML) 0.083% nebulizer solution Inhale 3 mLs (2.5 mg total) into the lungs every 6 (six) hours as needed for wheezing or shortness of breath. 03/27/18  Yes Epifanio Lesches, MD  busPIRone (BUSPAR) 10 MG tablet Take 10 mg by mouth 3 (three) times daily.   Yes [provider]  calcium carbonate (TUMS - DOSED IN MG ELEMENTAL CALCIUM) 500 MG chewable tablet Chew 2 tablets by mouth 2 (two) times daily.   Yes [provider]  citalopram (CELEXA) 20 MG tablet Take 20 mg by mouth daily.   Yes [provider]  docusate sodium (COLACE) 100 MG capsule Take 100 mg by mouth 2 (two) times daily.   Yes [provider]  famotidine (PEPCID) 40 MG  tablet Take 40 mg by mouth at bedtime.   Yes [provider]  furosemide (LASIX) 20 MG tablet Take 1 tablet (20 mg total) by mouth daily. 03/27/18  Yes Epifanio Lesches, MD  gabapentin (NEURONTIN) 100 MG capsule Take 200 mg by mouth at bedtime.   Yes [provider]  primidone (MYSOLINE) 50 MG tablet Take 50 mg by mouth 2 (two) times daily.   Yes [provider]   Liver Function Tests Recent Labs  Lab 03/26/18 2000 03/28/18 2130  AST 43* 43*  ALT 32 31  ALKPHOS 82 87  BILITOT 1.1 1.2  PROT 6.0* 5.9*  ALBUMIN 2.6* 2.3*   No results for input(s): LIPASE, AMYLASE in the last 168 hours. CBC Recent Labs  Lab  03/24/18 0458 03/26/18 2000 03/28/18 2130  WBC 4.3 4.5 4.9  HGB 8.8* 9.1* 9.1*  HCT 27.9* 28.7* 27.3*  MCV 106.9* 106.7* 101.5*  PLT 89* 86* 81*   Basic Metabolic Panel Recent Labs  Lab 03/24/18 0458 03/25/18 1422 03/26/18 2000 03/28/18 2130  NA 134* 134* 136 133*  K 5.5* 5.2* 5.4* 5.1  CL 107 105 105 103  CO2 24 24 23 23   GLUCOSE 79 107* 97 90  BUN 42* 42* 39* 45*  CREATININE 2.37* 2.17* 2.21* 2.32*  CALCIUM 8.6* 8.6* 8.9 8.8*   Iron/TIBC/Ferritin/ %Sat No results found for: IRON, TIBC, FERRITIN, IRONPCTSAT  Vitals:   03/29/18 0100 03/29/18 0500 03/29/18 0506 03/29/18 1455  BP:   (!) 101/43 115/60  Pulse:   71 75  Resp:   20 19  Temp:   97.6 F (36.4 C) 98 F (36.7 C)  TempSrc:   Oral Oral  SpO2:   95% 100%  Weight: 70.5 kg 68 kg    Height: 5' 2"  (1.575 m)      Exam Exam: Chron ill appearing small framed WF no distress, nasal O2 No jvd, flat neck veins Chest cta bilat to bases, no rales or wheezing Cor reg distant HS, no rub or gallop Abd mod obeseity, nontender, +bs, no sig ascites or hsm GU deferred MS no joint deformity or effusion Ext no edema LE's or UE's Neuro no asterixis, Ox 3, gen'd weakness  Home meds:  - buspirone 10 tid/ citalopram 20 qd/ gabapentin 200 hs/ primidone 50 bid  - furosemide 20 qd  - famotidine 20 qd  - prn's/ vitamins   CXR large L effusion layering, no edema  CT abd 03/06/18 - Adrenals/urinary tract: LEFT kidney is normal. RIGHT kidney normal. No adrenal abnormality  Creat Jan 2020 1.60 Creat Feb 2020 1.59- 2.57 Creat  03/24/18  2.37            03/25/18  2.17             03/26/18  2.21             03/28/18  2.32  Assessment: 1. 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 Creat up from baseline -1.6 in Jan, now 2.2.  K+up.  Alb 2.3.  The patient's weights are down, no ascites or edema on exam. May be intravasc dry due to lasix.  Will start IVF's and IV albumin.  Will consider midodrine/ octreotide  if doesn't improve w/ fluids. Get UA, urine lytes and renal US. Will follow.  2. NASH cirrhosis 3. Hyperkalemia - renal diet\ 4. Anemia - Hb 9.1 5. Low plts - due to #2    [facility] 03/29/2018, 4:46  PM

## 2018-03-29 NOTE — Progress Notes (Signed)
PROGRESS NOTE        PATIENT DETAILS Name: Kelly Mclean Age: 63 y.o. Sex: female Date of Birth: 1955/12/07 Admit Date: 03/28/2018 Admitting Physician Etta Quill, DO GYI:RSWN, Lemmie Evens, MD  Brief Narrative: Patient is a 63 y.o. 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  See below for further details  Subjective: Lying comfortably in bed denies any chest pain or shortness of breath.  No nausea vomiting.  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  Spoke with IR-tentatively scheduled for TIPS procedure on 3/8.  Note pleural fluid cytology x3- for malignancy.  Decompensated liver cirrhosis: Secondary to NASH.  Will increase Lasix to 60 mg daily given CKD stage III.  Not on spironolactone-as patient has had hyperkalemia in the past.  AKI on CKD stage III: I suspect this is hemodynamically mediated-check UA.  Hold Lasix-check urine sodium.  If worsens-we will consult nephrology.  Avoid nephrotoxic agents  Deconditioning: Obtain PT/OT evaluation  Depression/anxiety: Appears stable-continue BuSpar and Celexa  DVT Prophylaxis: SCD's  Code Status: Full code   Family Communication: None at bedside  Disposition Plan: Remain inpatient  Antimicrobial agents: Anti-infectives (From admission, onward)   None      Procedures: None  CONSULTS:  IR  Time spent: 25 minutes-Greater than 50% of this time was spent in counseling, explanation of diagnosis, planning of further management, and coordination of care.  MEDICATIONS: Scheduled Meds: . busPIRone  10 mg Oral TID  . calcium carbonate  2 tablet Oral BID    . citalopram  20 mg Oral Daily  . docusate sodium  100 mg Oral BID  . famotidine  40 mg Oral QHS  . furosemide  20 mg Oral Daily  . gabapentin  200 mg Oral QHS  . primidone  50 mg Oral BID   Continuous Infusions: PRN Meds:.albuterol, ondansetron **OR** ondansetron (ZOFRAN) IV   PHYSICAL EXAM: Vital signs: Vitals:   03/28/18 2210 03/29/18 0100 03/29/18 0500 03/29/18 0506  BP: (!) 114/55   (!) 101/43  Pulse: 69   71  Resp: 18   20  Temp: 98.4 F (36.9 C)   97.6 F (36.4 C)  TempSrc: Oral   Oral  SpO2: 100%   95%  Weight:  70.5 kg 68 kg   Height:  5' 2"  (1.575 m)     Filed Weights   03/29/18 0100 03/29/18 0500  Weight: 70.5 kg 68 kg   Body mass index is 27.42 kg/m.   General appearance :Awake, alert, not in any distress.  HEENT: Atraumatic and Normocephalic Resp: Decreased air entry in the left-but otherwise clear to auscultation CVS: S1 S2 regular, no murmurs.  GI: Bowel sounds present, Non tender and not distended  Extremities: B/L Lower Ext shows no edema, both legs are warm to touch Neurology:  speech clear,Non focal, sensation is grossly intact. Musculoskeletal:No digital cyanosis Skin:No Rash, warm and dry Wounds:N/A  I have personally reviewed following labs and imaging studies  LABORATORY DATA: CBC: Recent Labs  Lab 03/24/18 0458 03/26/18 2000 03/28/18 2130  WBC 4.3 4.5 4.9  HGB 8.8* 9.1* 9.1*  HCT 27.9* 28.7* 27.3*  MCV 106.9* 106.7*  101.5*  PLT 89* 86* 81*    Basic Metabolic Panel: Recent Labs  Lab 03/24/18 0458 03/25/18 1422 03/26/18 2000 03/28/18 2130  NA 134* 134* 136 133*  K 5.5* 5.2* 5.4* 5.1  CL 107 105 105 103  CO2 24 24 23 23   GLUCOSE 79 107* 97 90  BUN 42* 42* 39* 45*  CREATININE 2.37* 2.17* 2.21* 2.32*  CALCIUM 8.6* 8.6* 8.9 8.8*    GFR: Estimated Creatinine Clearance: 23 mL/min (A) (by C-G formula based on SCr of 2.32 mg/dL (H)).  Liver Function Tests: Recent Labs  Lab 03/26/18 2000 03/28/18 2130  AST 43* 43*   ALT 32 31  ALKPHOS 82 87  BILITOT 1.1 1.2  PROT 6.0* 5.9*  ALBUMIN 2.6* 2.3*   No results for input(s): LIPASE, AMYLASE in the last 168 hours. Recent Labs  Lab 03/26/18 2000  AMMONIA 28    Coagulation Profile: Recent Labs  Lab 03/26/18 2000  INR 1.4*    Cardiac Enzymes: No results for input(s): CKTOTAL, CKMB, CKMBINDEX, TROPONINI in the last 168 hours.  BNP (last 3 results) No results for input(s): PROBNP in the last 8760 hours.  HbA1C: No results for input(s): HGBA1C in the last 72 hours.  CBG: No results for input(s): GLUCAP in the last 168 hours.  Lipid Profile: No results for input(s): CHOL, HDL, LDLCALC, TRIG, CHOLHDL, LDLDIRECT in the last 72 hours.  Thyroid Function Tests: No results for input(s): TSH, T4TOTAL, FREET4, T3FREE, THYROIDAB in the last 72 hours.  Anemia Panel: No results for input(s): VITAMINB12, FOLATE, FERRITIN, TIBC, IRON, RETICCTPCT in the last 72 hours.  Urine analysis: No results found for: COLORURINE, APPEARANCEUR, LABSPEC, PHURINE, GLUCOSEU, HGBUR, BILIRUBINUR, KETONESUR, PROTEINUR, UROBILINOGEN, NITRITE, LEUKOCYTESUR  Sepsis Labs: Lactic Acid, Venous No results found for: LATICACIDVEN  MICROBIOLOGY: Recent Results (from the past 240 hour(s))  Body fluid culture     Status: None   Collection Time: 03/19/18  3:50 PM  Result Value Ref Range Status   Specimen Description   Final    PLEURAL Performed at [facility], [address], [place], [place] [address]    Special Requests   Final    NONE Performed at [facility], [address] [place], [place] [address]    Gram Stain   Final    FEW WBC PRESENT, PREDOMINANTLY MONONUCLEAR NO ORGANISMS SEEN    Culture   Final    NO GROWTH 3 DAYS Performed at [facility], [place] [address] [place], [place] [address]    Report Status 03/23/2018 FINAL  Final  Body fluid culture     Status: None   Collection Time: 03/19/18  4:00 PM  Result Value Ref Range  Status   Specimen Description   Final    PERITONEAL Performed at [facility], [address], [place], [place] [address]    Special Requests   Final    NONE Performed at [facility], [address] [place], [place] [address]    Gram Stain   Final    RARE WBC PRESENT, PREDOMINANTLY MONONUCLEAR NO ORGANISMS SEEN    Culture   Final    NO GROWTH 3 DAYS Performed at [facility], [place] [address], [place],  [address]    Report Status 03/22/2018 FINAL  Final    RADIOLOGY STUDIES/RESULTS: Ct Abdomen Pelvis Wo Contrast  Result Date: 03/20/2018 CLINICAL DATA:  History of cirrhosis of liver, dyspnea. Hepatic hydrothorax. Fluid with lymphocytes requiring rule out lymphoma.  EXAM: CT ABDOMEN AND PELVIS WITHOUT CONTRAST TECHNIQUE: Multidetector CT imaging of the abdomen and pelvis was performed following the standard protocol without IV contrast. COMPARISON:  CT abdomen dated 03/06/2018 FINDINGS: Lower chest: Again noted is a large LEFT pleural effusion, incompletely imaged. Lingular consolidation is likely associated atelectasis. Hepatobiliary: Cirrhotic appearing liver. Gallstones are present within the nondistended gallbladder. No bile duct dilatation seen. Pancreas: Unremarkable. No pancreatic ductal dilatation or surrounding inflammatory changes. Spleen: Normal in size without focal abnormality. Adrenals/Urinary Tract: Kidneys are unremarkable without mass, stone or hydronephrosis. No perinephric fluid. No ureteral or bladder calculi identified. Bladder is obscured by overlying ascites. Stomach/Bowel: No dilated large or small bowel loops. 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 Appendix is normal. Stomach is unremarkable. Vascular/Lymphatic: Aortic atherosclerosis. Enlarged lymph node adjacent to the pancreatic head, 1.6 cm, better seen on the recent contrast-enhanced CT of 03/06/2018. No other enlarged lymph nodes appreciated in the abdomen or pelvis.  Reproductive: Uterus appears normal. Ascites obscures visualization of the bilateral adnexa. Other: Moderate amount of ascites within the abdomen and pelvis, largest component within the pelvis. No evidence of circumscribed fluid collection or abscess. No free intraperitoneal air. Musculoskeletal: No acute or suspicious osseous finding. Superficial soft tissues are unremarkable. IMPRESSION: 1. Cirrhotic liver. 2. Moderate amount of ascites within the abdomen and pelvis, largest component within the pelvis. 3. Enlarged lymph node adjacent to the pancreatic head, measuring 1.6 cm, better seen on the recent contrast-enhanced CT of 03/06/2016, favor reactive over neoplastic etiology given the absence of additional lymphadenopathy. 4. Large LEFT pleural effusion, incompletely imaged. 5. Cholelithiasis without evidence of acute cholecystitis. Aortic Atherosclerosis (ICD10-I70.0). Electronically Signed   By: Franki Cabot M.D.   On: 03/20/2018 14:20   Dg Chest 1 View  Result Date: 03/26/2018 CLINICAL DATA:  Shortness of breath. EXAM: CHEST  1 VIEW COMPARISON:  Multiple prior exams most recent radiograph 03/24/2018. Most recent CT 03/14/2018 FINDINGS: Known left pleural effusion has increased in size over the past 2 days, now large in size. Associated compressive atelectasis throughout the left lung. Mild rightward mediastinal shift versus rotation. Only a small portion of aerated left perihilar lung. Hypoventilatory right lung without acute finding. No visualized pneumothorax. IMPRESSION: Increased size of known left pleural effusion, now large in size. This causes mild mass effect in the mediastinum with rightward mediastinal shift. Electronically Signed   By: Keith Rake M.D.   On: 03/26/2018 19:37   Dg Chest 1 View  Result Date: 03/14/2018 CLINICAL DATA:  Status post thoracentesis EXAM: CHEST  1 VIEW COMPARISON:  March 14, 2018 12:56 p.m. FINDINGS: The heart size and mediastinal contours are within normal  limits. There is small left pleural effusion significantly decreased compared prior exam. Consolidation left lung base is noted. There is no pneumothorax. The right lung is clear. The visualized skeletal structures are stable. IMPRESSION: There is small left pleural effusion, significantly decreased compared prior exam. Consolidation of left lung base is identified. There is no pneumothorax. Electronically Signed   By: Abelardo Diesel M.D.   On: 03/14/2018 16:19   Ct Chest Wo Contrast  Result Date: 03/14/2018 CLINICAL DATA:  Pleural effusion and dyspnea since yesterday. EXAM: CT CHEST WITHOUT CONTRAST TECHNIQUE: Multidetector CT imaging of the chest was performed following the standard protocol without IV contrast. COMPARISON:  03/14/2018 CXR FINDINGS: Cardiovascular: Common arterial branch of the right brachiocephalic left common carotid arteries. Atherosclerosis of left subclavian artery origin. Nonaneurysmal atherosclerotic aorta. The unenhanced pulmonary vessels are unremarkable. The heart size is  normal with small anterior pericardial effusion without thickening noted. Mediastinum/Nodes: Small subcentimeter prevascular and paratracheal lymph nodes. 1 cm short axis subcarinal lymph node. Patent trachea and mainstem bronchi. Unremarkable CT appearance of the esophagus. The thyroid gland is unremarkable without dominant mass. Lungs/Pleura: Moderate to large layering left effusion with adjacent atelectasis. The right lung is clear. No pneumothorax or dominant mass. Upper Abdomen: Cirrhotic appearance of the liver with moderate to large volume of ascites. Musculoskeletal: No chest wall mass or suspicious bone lesions identified. Mild thoracic spondylosis. IMPRESSION: 1. Moderate to large layering left pleural effusion with adjacent atelectasis. 2. Cirrhotic liver with moderate to large volume of ascites. Aortic Atherosclerosis (ICD10-I70.0). Electronically Signed   By: Ashley Royalty M.D.   On: 03/14/2018 22:17    US Abdomen Complete  Result Date: 03/04/2018 CLINICAL DATA:  62 year old with hepatic cirrhosis. EXAM: ABDOMEN ULTRASOUND COMPLETE COMPARISON:  Visualized upper abdomen on CT chest 02/18/2018. FINDINGS: Gallbladder: Numerous shadowing gallstones, the largest measuring approximately 9 mm. Echogenic sludge. Mild gallbladder wall thickening up to approximately 4 mm. No pericholecystic fluid. Negative sonographic Murphy's sign according to the ultrasound technologist. Common bile duct: Diameter: Approximately 4 mm. No visible bile duct stones. Liver: Diffusely coarsened echotexture and irregular hepatic contour. No focal hepatic parenchymal abnormalities. Mildly increased echotexture diffusely. Portal vein is patent on color Doppler imaging with normal direction of blood flow towards the liver. IVC: Patent. Pancreas: Hypoechoic mass adjacent to or involving the pancreatic head or porta hepatis lymph node measuring approximately 2.4 x 1.4 x 2.8 cm. Visualized pancreas otherwise unremarkable. The tail is obscured by overlying bowel gas. The pancreas was not included on the recent CT chest. Spleen: Enlarged, measuring approximately 15.8 x 15.8 x 5.2 cm, giving a volume of approximately 681 mL. No focal parenchymal abnormality. Right Kidney: Length: Approximately 9.0 cm. No hydronephrosis. Well-preserved cortex. Mildly echogenic parenchyma. Approximate 1.2 x 1.1 x 2.0 cm mildly complex cyst with a thin internal septation involving the UPPER pole of the RIGHT kidney. No solid renal mass. Left Kidney: Length: Approximately 8.2 cm. No hydronephrosis. Well-preserved cortex. Mildly echogenic parenchyma. No focal parenchymal abnormalities. Abdominal aorta: Normal in caliber throughout its visualized course in the abdomen with evidence of atherosclerosis. Maximum diameter 2.4 cm. Other findings: Small amount of ascites. Large LEFT pleural effusion. IMPRESSION: 1. Hepatic cirrhosis.  No focal hepatic parenchymal abnormality.  2. Hypoechoic mass adjacent to or involving the pancreatic head versus enlarged porta hepatis lymph node. The pancreas was not included on the recent CT chest, so a CT of the abdomen and pelvis with contrast is recommended in further evaluation. 3. Cholelithiasis.  No sonographic evidence of acute cholecystitis. 4. Splenomegaly without focal splenic parenchymal abnormality, indicating portal hypertension. 5. Patent portal vein with normal antegrade hepatopetal flow. 6. Small amount of ascites. 7. Large LEFT pleural effusion. Electronically Signed   By: Evangeline Dakin M.D.   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 Plan for nuclear medicine ascites - hydrothorax examination. Plan for an ultrasound-guided paracentesis and injection of radiopharmaceutical into the ascites. Left thoracentesis was performed prior to this procedure. EXAM: ULTRASOUND GUIDED PARACENTESIS INJECTION OF RADIOPHARMACEUTICAL INTO ASCITES MEDICATIONS: None. COMPLICATIONS: None immediate. PROCEDURE: Informed written consent was obtained from the patient after a discussion of the risks, benefits and alternatives to treatment. A timeout was performed prior to the initiation of the procedure. Initial ultrasound scanning demonstrates a small amount of ascites within the right upper  abdominal quadrant. The right upper abdomen was prepped and draped in the usual sterile fashion. 1% lidocaine with was used for local anesthesia. Following this, a 6 Fr Safe-T-Centesis catheter was introduced. An ultrasound image was saved for documentation purposes. The paracentesis was performed. 60 mL of opaque yellow fluid was removed. The radiopharmaceutical was injected through the Safe-T-Centesis catheter. Catheter was flushed with sterile saline. The catheter was removed and a dressing was applied. The patient tolerated the procedure well without immediate post  procedural complication. FINDINGS: Small amount of fluid around the liver. Small amount of fluid in the left lower quadrant. 60 mL of fluid was removed from the right upper quadrant. The radiopharmaceutical was injected into the right upper quadrant. IMPRESSION: Successful ultrasound-guided paracentesis yielding 60 mL of ascites. Fluid was sent for labs. Radiopharmaceutical was injected through the paracentesis catheter for the nuclear medicine examination. Electronically Signed   By: Markus Daft M.D.   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 Patient is being evaluated for a hepatic hydrothorax. EXAM: NUCLEAR MEDICINE SULFUR COLLOID PERITONEAL SCINTIGRAPHY TECHNIQUE: Ultrasound-guided left thoracentesis was performed. 1.1 L of left pleural fluid was removed but not all of the fluid was removed. Subsequently, an ultrasound-guided paracentesis was performed in the right upper quadrant. 60 mL of peritoneal fluid was removed. Technetium 99 M sulfur colloid was injected through the paracentesis catheter. Paracentesis catheter was removed. RADIOPHARMACEUTICALS:  7 millicuries technetium 99 M sulfur colloid COMPARISON:  Chest CT 03/14/2018 FINDINGS: Imaging was obtained over 1 hour. The initial images demonstrated radiopharmaceutical throughout the peritoneal cavity. The largest peritoneal pockets are in the right upper quadrant and left lower quadrant. Over time, radiopharmaceutical starts to accumulate in the left hemithorax. Findings are compatible with a trans diaphragmatic connection between the peritoneal cavity and the left pleural space. IMPRESSION: Study is positive for connection between the peritoneal space and left pleural space. 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 Electronically Signed   By: Markus Daft M.D.   On: 03/19/2018 17:24   US  Paracentesis  Result Date: 03/19/2018 INDICATION: 63 year old with cirrhosis and recurrent left hydrothorax. Plan for nuclear medicine ascites - hydrothorax examination. Plan for an ultrasound-guided paracentesis and injection of radiopharmaceutical into the ascites. Left thoracentesis was performed prior to this procedure. EXAM: ULTRASOUND GUIDED PARACENTESIS INJECTION OF RADIOPHARMACEUTICAL INTO ASCITES MEDICATIONS: None. COMPLICATIONS: None immediate. PROCEDURE: Informed written consent was obtained from the patient after a discussion of the risks, benefits and alternatives to treatment. A timeout was performed prior to the initiation of the procedure. Initial ultrasound scanning demonstrates a small amount of ascites within the right upper abdominal quadrant. The right upper abdomen was prepped and draped in the usual sterile fashion. 1% lidocaine with was used for local anesthesia. Following this, a 6 Fr Safe-T-Centesis catheter was introduced. An ultrasound image was saved for documentation purposes. The paracentesis was performed. 60 mL of opaque yellow fluid was removed. The radiopharmaceutical was injected through the Safe-T-Centesis catheter. Catheter was flushed with sterile saline. The catheter was removed and a dressing was applied. The patient tolerated the procedure well without immediate post procedural complication. FINDINGS: Small amount of fluid around the liver. Small amount of fluid in the left lower quadrant. 60 mL of fluid was removed from the right upper quadrant. The radiopharmaceutical was injected into the right upper quadrant. IMPRESSION: Successful ultrasound-guided paracentesis yielding 60 mL of  ascites. Fluid was sent for labs. Radiopharmaceutical was injected through the paracentesis catheter for the nuclear medicine examination. Electronically Signed   By: Markus Daft M.D.   On: 03/19/2018 17:06   Ct Abdomen W Wo Contrast  Result Date: 03/06/2018 CLINICAL DATA:  Pancreatic mass  seen on earlier [place]. Pancreatic mass workup . Cirrhosis. Epigastric and LUQ pain. 35m of Omni 300 used. Pt could only tolerate laying on her left side for scan.^1058mOMNIPAQUE IOHEXOL 300 MG/ML SOLNPancreatic mass EXAM: CT ABDOMEN WITHOUT AND WITH CONTRAST TECHNIQUE: Multidetector CT imaging of the abdomen was performed following the standard protocol before and following the bolus administration of intravenous contrast. CONTRAST:  10073mMNIPAQUE IOHEXOL 300 MG/ML  SOLN COMPARISON:  Ultrasound 03/04/2010 FINDINGS: Lower chest:  Large LEFT pleural effusion with passive atelectasis. Hepatobiliary: Nodular liver. Caudate lobe is enlarged. Portal veins are patent. Multiple gallstones within lumen gallbladder. No enhancing hepatic lesion. No intrahepatic or extrahepatic biliary duct dilatation. Pancreas: No abnormality of the pancreatic head body or tail. No duct dilatation. Periampullary duodenum diverticulum noted. Lymph node position inferior to the pancreas adjacent to the portal vein measures 16 mm (image 49/4) Spleen: Spleen borderline enlarged. Adrenals/urinary tract: LEFT kidney is normal. RIGHT kidney normal. No adrenal abnormality Stomach/Bowel: Stomach and limited of the small bowel is unremarkable Vascular/Lymphatic: Abdominal aortic normal caliber. No retroperitoneal periportal lymphadenopathy. Musculoskeletal: No aggressive osseous lesion IMPRESSION: 1. No pancreatic lesion identified. 2. Peripancreatic lymph node is mildly enlarged. 3. Small periampullary duodenum diverticulum present. 4. Morphologic changes in liver consistent with cirrhosis. No biliary duct dilatation of the intrahepatic or extrahepatic bile ducts. 5. No enhancing hepatic lesion. 6. Cholelithiasis. 7. Moderate volume of intraperitoneal free fluid. 8. Large LEFT pleural effusion Electronically Signed   By: SteSuzy BouchardD.   On: 03/06/2018 16:31   Dg Bone Density (dxa)  Result Date: 03/11/2018 EXAM: DUAL X-RAY ABSORPTIOMETRY  (DXA) FOR BONE MINERAL DENSITY IMPRESSION: Dear Dr, RosLutricia Feilour patient LesJaquel Coomermpleted a FRAX assessment on 03/11/2018 using the LunWalkernalysis version: 14.10) manufactured by [employer] following summarizes the results of our evaluation. PATIENT BIOGRAPHICAL: Name: LapJenifer, Struvetient ID: [id number] Date: 02/March 11, 1957ight:    62.5 in. Gender:     Female    [age]       Weight:    158.4 lbs. Ethnicity:  White                            Exam Date: 03/11/2018 FRAX* RESULTS:  (version: 3.5) 10-year Probability of Fracture1 Major Osteoporotic Fracture2 Hip Fracture 8.1% 0.7% Population: [place]) Risk Factors: None Based on Femur (Left) Neck BMD 1 -The 10-year probability of fracture may be lower than reported if the patient has received treatment. 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 The probability of a hip fracture is 0.7% within the next ten years. . Technologist: SCE PATIENT BIOGRAPHICAL: Name: LapMaxwell, Martoranotient ID: [id number] Date: [birth year]ight: 62.5 in. Gender: Female Exam Date: 03/11/2018 Weight: 158.4 lbs. Indications: Asthma, Caucasian, Cirrhosis, Early Menopause, Postmenopausal, Previous Smoker Fractures: Treatments: Gabapentin, primidone ASSESSMENT: The BMD measured at Femur Neck Left is 0.833 g/cm2 with  a T-score of -1.5. This patient is considered osteopenic according to [facility]) criteria. The quality of the scan is good. Site Region Measured Measured WHO Young Adult BMD Date       Age      Classification T-score AP Spine L1-L4 03/11/2018 [age] Osteopenia -1.5 1.007 g/cm2 DualFemur Neck Left 03/11/2018 63.0  Osteopenia -1.5 0.833 g/cm2 World Health Organization [facility]) criteria for post-menopausal, Caucasian Women: Normal:       T-score at or above -1 SD Osteopenia:   T-score between -1 and -2.5 SD Osteoporosis: T-score at or below -2.5 SD RECOMMENDATIONS: 1. All patients should optimize calcium and vitamin D intake. 2. Consider FDA-approved medical therapies in postmenopausal women and men aged 71 years and older, based on the following: a. A hip or vertebral(clinical or morphometric) fracture b. T-score < -2.5 at the femoral neck or spine after appropriate evaluation to exclude secondary causes c. Low bone mass (T-score between -1.0 and -2.5 at the femoral neck or spine) and a 10-year probability of a hip fracture > 3% or a 10-year probability of a major osteoporosis-related fracture > 20% based on the [place]-adapted WHO algorithm d. Clinician judgment and/or patient preferences may indicate treatment for people with 10-year fracture probabilities above or below these levels FOLLOW-UP: People with diagnosed cases of osteoporosis or at high risk for fracture should have regular bone mineral density tests. For patients eligible for Medicare, routine testing is allowed once every 2 years. The testing frequency can be increased to one year for patients who have rapidly progressing disease, those who are receiving or discontinuing medical therapy to restore bone mass, or have additional risk factors. I have reviewed this report, and agree with the above findings. [facility] Radiology Electronically Signed   By: Lowella Grip III M.D.   On: 03/11/2018 11:23   Dg Chest Port 1 View  Result Date: 03/28/2018 CLINICAL DATA:  Recurrent effusion EXAM: PORTABLE CHEST 1 VIEW COMPARISON:  03/27/2018, 03/26/2018, 03/24/2018 FINDINGS: Interval diffuse hazy opacity left thorax likely due to layering effusion, moderate in size. This is increased compared to prior. Underlying edema not excluded. Stable cardiomediastinal silhouette. Left  basilar airspace disease. IMPRESSION: Increased moderate left pleural effusion with development of diffuse hazy opacity in the left thorax which may reflect layering fluid and or edema. Increased airspace disease at the left base. Electronically Signed   By: Donavan Foil M.D.   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 EXAM: PORTABLE CHEST 1 VIEW COMPARISON:  Chest radiograph-03/26/2018 FINDINGS: Grossly unchanged cardiac silhouette and mediastinal contours. Interval reduction in persistent moderate size left-sided effusion post thoracentesis. No pneumothorax. Improved aeration of the left mid and lower lung with persistent left basilar heterogeneous/consolidative opacities. No new focal airspace opacities. No definite right-sided pleural effusion. No evidence of edema. No acute osseous abnormalities. IMPRESSION: 1. Interval reduction in persistent moderate size left-sided effusion post thoracentesis. No pneumothorax. 2. Improved aeration of the left lung base with persistent left basilar atelectasis. Electronically Signed   By: Sandi Mariscal M.D.   On: 03/27/2018 13:34   Dg Chest Port 1 View  Result Date: 03/24/2018 CLINICAL DATA:  Post left thoracentesis EXAM: PORTABLE CHEST 1 VIEW COMPARISON:  03/21/2018 FINDINGS: Moderate to large left pleural effusion minimally changed since prior study. No pneumothorax. Left basilar opacity, likely atelectasis. No confluent opacity on the right. Heart is upper limits normal  in size. IMPRESSION: Moderate to large-sized left pleural effusion minimally changed. No pneumothorax. Continued left base atelectasis or infiltrate. Electronically Signed   By: Rolm Baptise M.D.   On: 03/24/2018 12:46   Dg Chest Port 1 View  Result Date: 03/19/2018 CLINICAL DATA:  Status post thoracentesis. EXAM: PORTABLE CHEST 1 VIEW COMPARISON:  03/14/2018 FINDINGS: 1702  hours. Moderate to large left pleural effusion noted without evidence for pneumothorax. Left base collapse/consolidation. The cardio pericardial silhouette is enlarged. The visualized bony structures of the thorax are intact. Telemetry leads overlie the chest. IMPRESSION: 1. No evidence for pneumothorax status post thoracentesis. 2. Moderate to large left pleural effusion. Electronically Signed   By: Misty Stanley M.D.   On: 03/19/2018 18:56   Dg Chest Portable 1 View  Result Date: 03/14/2018 CLINICAL DATA:  Shortness of breath. EXAM: PORTABLE CHEST 1 VIEW COMPARISON:  Chest x-ray 03/06/2018. FINDINGS: Mediastinum and hilar structures normal. Large left pleural effusion, increased in size from prior exam. Underlying left lung atelectasis/infiltrate can not be excluded. No pneumothorax. Heart size most likely stable. IMPRESSION: Large left pleural effusion, increased from prior exam. Electronically Signed   By: Marcello Moores  Register   On: 03/14/2018 13:19   Dg Chest Port 1 View  Result Date: 03/06/2018 CLINICAL DATA:  Post large volume left-sided thoracentesis. EXAM: PORTABLE CHEST 1 VIEW COMPARISON:  Chest radiograph-02/20/2018; CT abdomen pelvis-earlier same day FINDINGS: Interval reduction in persistent moderate size left-sided effusion post large volume thoracentesis. No pneumothorax. Improved aeration of left lung base with persistent left basilar heterogeneous/consolidative opacities. No definite evidence of right-sided pleural effusion. No new focal airspace opacities. Unchanged cardiac silhouette and mediastinal contours. No acute osseous abnormalities. IMPRESSION: Interval reduction in persistent moderate sized left-sided effusion post thoracentesis. No pneumothorax. Electronically Signed   By: Sandi Mariscal M.D.   On: 03/06/2018 16:02   Mm 3d Screen Breast Bilateral  Result Date: 03/12/2018 CLINICAL DATA:  Screening. EXAM: DIGITAL SCREENING BILATERAL MAMMOGRAM WITH TOMO AND CAD COMPARISON:  Previous  exam(s). ACR Breast Density Category b: There are scattered areas of fibroglandular density. FINDINGS: There are no findings suspicious for malignancy. Images were processed with CAD. IMPRESSION: No mammographic evidence of malignancy. A result letter of this screening mammogram will be mailed directly to the patient. RECOMMENDATION: Screening mammogram in one year. (Code:SM-B-01Y) BI-RADS CATEGORY  1: Negative. Electronically Signed   By: Dorise Bullion III M.D   On: 03/12/2018 14:44   Nm Hepato Biliary Leak  Result Date: 03/17/2018 CLINICAL DATA:  Cholelithiasis. Nausea and vomiting. Hepatic cirrhosis. EXAM: NUCLEAR MEDICINE HEPATOBILIARY IMAGING TECHNIQUE: Sequential images of the abdomen were obtained out to 60 minutes following intravenous administration of radiopharmaceutical. RADIOPHARMACEUTICALS:  5.1 mCi Tc-63m Choletec IV COMPARISON:  CT on 03/14/2018 FINDINGS: Delayed uptake and biliary excretion of activity by the liver is seen, consistent with known hepatic cirrhosis. Gallbladder activity is visualized, consistent with patency of cystic duct. Biliary activity passes into small bowel, consistent with patent common bile duct. IMPRESSION: No evidence of acute cholecystitis or biliary ductal dilatation. Hepatocellular dysfunction, consistent with hepatic cirrhosis. Electronically Signed   By: JEarle GellM.D.   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 Please perform ultrasound-guided thoracentesis for therapeutic purposes. EXAM: [place]OR[place]IS ASP PLEURAL SPACE W/IMG GUIDE COMPARISON:  Chest radiograph-03/26/2018 MEDICATIONS: None. COMPLICATIONS: None immediate. TECHNIQUE: Informed written consent was obtained from the patient after  a discussion of the risks, benefits and alternatives to treatment. A timeout was performed prior to  the initiation of the procedure. Initial ultrasound scanning demonstrates a large minimally complex though predominantly anechoic left-sided pleural effusion. The lower chest was prepped and draped in the usual sterile fashion. 1% lidocaine was used for local anesthesia. An ultrasound image was saved for documentation purposes. An 8 Fr Safe-T-Centesis catheter was introduced. The thoracentesis was performed. The catheter was removed and a dressing was applied. The patient tolerated the procedure well without immediate post procedural complication. The patient was escorted to have an upright chest radiograph. FINDINGS: A total of approximately 2.2 liters of serous fluid was removed. IMPRESSION: Successful ultrasound-guided left sided thoracentesis yielding 2.2 liters of pleural fluid. Electronically Signed   By: Sandi Mariscal M.D.   On: 03/27/2018 13:37   US Thoracentesis Asp Pleural Space W/img Guide  Result Date: 03/24/2018 INDICATION: Patient with history of hepatic hydrothorax, cirrhosis and recurrent left pleural effusion who presents today originally for paracentesis in order to obtain flow cytometry to r/o malignancy prior to possible TIPS procedure. Limited abdominal US shows scant peritoneal fluid not amenable to percutaneous drainage. Diagnostic and therapeutic thoracentesis performed in order to obtain requested labs in lieu of paracentesis given patient's history of hepatic hydrothorax. EXAM: ULTRASOUND GUIDED LEFT THORACENTESIS MEDICATIONS: 20 mL 1% lidocaine. COMPLICATIONS: None immediate. PROCEDURE: An ultrasound guided thoracentesis was thoroughly discussed with the patient and questions answered. The benefits, risks, alternatives and complications were also discussed. The patient understands and wishes to proceed with the procedure. Written consent was obtained. Ultrasound was performed to localize and mark an adequate pocket of fluid in the left chest. The area was then prepped and draped in the  normal sterile fashion. 1% Lidocaine was used for local anesthesia. Under ultrasound guidance a 6 Fr Safe-T-Centesis catheter was introduced. Thoracentesis was performed. The catheter was removed and a dressing applied. FINDINGS: A total of approximately 1.1 L of pink milky fluid was removed. Samples were sent to the laboratory as requested by the clinical team. IMPRESSION: Successful ultrasound guided left thoracentesis yielding 1.1 L of pleural fluid. Read by Candiss Norse, PA-C Electronically Signed   By: Lucrezia Europe M.D.   On: 03/24/2018 12:49   US Thoracentesis Asp Pleural Space W/img Guide  Result Date: 03/19/2018 INDICATION: 63 year old with cirrhosis and recurrent left hydrothorax. Patient is being evaluated for a connection between the peritoneal and left pleural space with a nuclear medicine examination. Patient has a large amount of left pleural fluid on ultrasound examination. Plan to remove some of the left pleural fluid prior to the nuclear medicine examination. EXAM: ULTRASOUND GUIDED LEFT THORACENTESIS MEDICATIONS: None. COMPLICATIONS: None immediate. PROCEDURE: An ultrasound guided thoracentesis was thoroughly discussed with the patient and questions answered. The benefits, risks, alternatives and complications were also discussed. The patient understands and wishes to proceed with the procedure. Written consent was obtained. Ultrasound was performed to localize and mark an adequate pocket of fluid in the left chest. The area was then prepped and draped in the normal sterile fashion. 1% Lidocaine was used for local anesthesia. Under ultrasound guidance a 6 Fr Safe-T-Centesis catheter was introduced. Thoracentesis was performed. The catheter was removed and a dressing applied. FINDINGS: A total of approximately 1.1 L of opaque yellow fluid was removed. Samples were sent to the laboratory as requested by the clinical team. IMPRESSION: Successful ultrasound guided left thoracentesis yielding 1.1  L of pleural fluid. Electronically Signed   By: Markus Daft  M.D.   On: 03/19/2018 17:11   US Thoracentesis Asp Pleural Space W/img Guide  Result Date: 03/14/2018 CLINICAL DATA:  Recurrent left pleural effusion. EXAM: ULTRASOUND GUIDED LEFT THORACENTESIS COMPARISON:  None. PROCEDURE: An ultrasound guided thoracentesis was thoroughly discussed with the patient and questions answered. The benefits, risks, alternatives and complications were also discussed. The patient understands and wishes to proceed with the procedure. Written consent was obtained. Ultrasound was performed to localize and mark an adequate pocket of fluid in the left chest. The area was then prepped and draped in the normal sterile fashion. 1% Lidocaine was used for local anesthesia. Under ultrasound guidance a 6 French Safe-T-Centesis catheter was introduced. Thoracentesis was performed. The catheter was removed and a dressing applied. COMPLICATIONS: None FINDINGS: A total of approximately 2.3 L of cloudy, yellowish-brown fluid was removed. A fluid sample was sent for laboratory analysis. IMPRESSION: Successful ultrasound guided left thoracentesis yielding 2.3 L of pleural fluid. Electronically Signed   By: Aletta Edouard M.D.   On: 03/14/2018 16:13   US Thoracentesis Asp Pleural Space W/img Guide  Result Date: 03/06/2018 INDICATION: History of cirrhosis now with recurrent symptomatic left-sided pleural effusion worrisome for hepatic hydrothorax. Please from ultrasound-guided thoracentesis for therapeutic purposes. EXAM: US THORACENTESIS ASP PLEURAL SPACE W/IMG GUIDE COMPARISON:  Ultrasound-guided thoracentesis-03/22/2018 (yielding 2.2 L of pleural fluid); CT abdomen pelvis-earlier same day MEDICATIONS: None. COMPLICATIONS: None immediate. TECHNIQUE: Informed written consent was obtained from the patient after a discussion of the risks, benefits and alternatives to treatment. A timeout was performed prior to the initiation of the procedure.  Initial ultrasound scanning demonstrates a large anechoic left-sided pleural effusion. The lower chest was prepped and draped in the usual sterile fashion. 1% lidocaine was used for local anesthesia. An ultrasound image was saved for documentation purposes. An 8 Fr Safe-T-Centesis catheter was introduced. The thoracentesis was performed. Despite residual fluid within the left pleural space, the patient wished to terminate the procedure given left-sided chest pain. As such, the catheter was removed and a dressing was applied. The patient tolerated the procedure well without immediate post procedural complication. The patient was escorted to have an upright chest radiograph. FINDINGS: A total of approximately 1.9 liters of amber colored serous fluid was removed. IMPRESSION: Successful ultrasound-guided left sided thoracentesis yielding 1.9 liters of pleural fluid. Electronically Signed   By: Sandi Mariscal M.D.   On: 03/06/2018 16:04     LOS: 1 day   Oren Binet, MD  [facility]  If 7PM-7AM, please contact night-coverage  Please page via [email or web address]  Go to [email or web address] and use [facility]'s universal password to access. If you do not have the password, please contact the hospital operator.  Locate the [facility] provider you are looking for under [facility] and page to a number that you can be directly reached. If you still have difficulty reaching the provider, please page the [facility] (Director on Call) for the Hospitalists listed on amion for assistance.  03/29/2018, 11:24 AM

## 2018-03-30 ENCOUNTER — Inpatient Hospital Stay (HOSPITAL_COMMUNITY): Payer: Medicare (Managed Care)

## 2018-03-30 LAB — COMPREHENSIVE METABOLIC PANEL
ALBUMIN: 3 g/dL — AB (ref 3.5–5.0)
ALT: 24 U/L (ref 0–44)
AST: 30 U/L (ref 15–41)
Alkaline Phosphatase: 61 U/L (ref 38–126)
Anion gap: 4 — ABNORMAL LOW (ref 5–15)
BUN: 44 mg/dL — ABNORMAL HIGH (ref 8–23)
CO2: 24 mmol/L (ref 22–32)
Calcium: 8.8 mg/dL — ABNORMAL LOW (ref 8.9–10.3)
Chloride: 107 mmol/L (ref 98–111)
Creatinine, Ser: 2.06 mg/dL — ABNORMAL HIGH (ref 0.44–1.00)
GFR calc Af Amer: 29 mL/min — ABNORMAL LOW (ref >60)
GFR calc non Af Amer: 25 mL/min — ABNORMAL LOW (ref >60)
Glucose, Bld: 79 mg/dL (ref 70–99)
Potassium: 4.3 mmol/L (ref 3.5–5.1)
Sodium: 135 mmol/L (ref 135–145)
Total Bilirubin: 1.3 mg/dL — ABNORMAL HIGH (ref 0.3–1.2)
Total Protein: 5.5 g/dL — ABNORMAL LOW (ref 6.5–8.1)

## 2018-03-30 LAB — CBC
HCT: 22.3 % — ABNORMAL LOW (ref 36.0–46.0)
Hemoglobin: 7.4 g/dL — ABNORMAL LOW (ref 12.0–15.0)
MCH: 33.9 pg (ref 26.0–34.0)
MCHC: 33.2 g/dL (ref 30.0–36.0)
MCV: 102.3 fL — ABNORMAL HIGH (ref 80.0–100.0)
Platelets: 54 10*3/uL — ABNORMAL LOW (ref 150–400)
RBC: 2.18 MIL/uL — ABNORMAL LOW (ref 3.87–5.11)
RDW: 13.8 % (ref 11.5–15.5)
WBC: 2.4 10*3/uL — ABNORMAL LOW (ref 4.0–10.5)
nRBC: 0 % (ref 0.0–0.2)

## 2018-03-30 MED ORDER — MIDODRINE HCL 5 MG PO TABS
10.0000 mg | ORAL_TABLET | Freq: Three times a day (TID) | ORAL | Status: DC
  Administered 2018-03-30 – 2018-04-03 (×7): 10 mg via ORAL
  Filled 2018-03-30 (×10): qty 2

## 2018-03-30 MED ORDER — LIDOCAINE HCL (PF) 1 % IJ SOLN
INTRAMUSCULAR | Status: AC
  Filled 2018-03-30: qty 30

## 2018-03-30 MED ORDER — OCTREOTIDE ACETATE 100 MCG/ML IJ SOLN
150.0000 ug | Freq: Three times a day (TID) | INTRAMUSCULAR | Status: DC
  Administered 2018-03-30 – 2018-04-04 (×14): 150 ug via SUBCUTANEOUS
  Filled 2018-03-30 (×20): qty 1.5

## 2018-03-30 NOTE — Progress Notes (Signed)
PROGRESS NOTE        PATIENT DETAILS Name: Kelly Mclean Age: 63 y.o. Sex: female Date of Birth: 01/21/1956 Admit Date: 03/28/2018 Admitting Physician Etta Quill, DO YOV:ZCHY, Lemmie Evens, MD  Brief Narrative: Patient is a 63 y.o. 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  See below for further details  Subjective: Some mild shortness of breath-but denies any chest pain nausea vomiting.  Assessment/Plan: Recurrent left-sided pleural effusion secondary to hepatic hydrothorax: Required repeated thoracocentesis x 6 in the past 2 weeks.  Thought to have hepatic hydrothorax-IR following for TIPS procedure-tentatively planned on the next few days.Note pleural fluid cytology x3- for malignancy.  Decompensated liver cirrhosis: Secondary to NASH.  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  AKI on CKD stage III: Suspected to be hepatorenal syndrome-creatinine improved with IV fluids-but has developed worsening pleural effusion-nephrology starting on octreotide, midodrine and IV albumin.  Continue to avoid nephrotoxic agents  Deconditioning: Await PT/OT evaluation  Depression/anxiety: Appears stable-continue BuSpar and Celexa  Palliative care: Unfortunate 63 year old with [name] related liver cirrhosis-now with hepatic hydrothorax and worsening renal function.  Poor prognosis for nephrology-due to worsening renal function-we will go ahead and consult palliative care.  Reconfirmed DNR earlier this morning.  DVT Prophylaxis: SCD's  Code Status: DNR  Family Communication: None at bedside  Disposition Plan: Remain inpatient  Antimicrobial agents: Anti-infectives (From admission, onward)   None      Procedures: None  CONSULTS:  IR  Time spent: 25  minutes-Greater than 50% of this time was spent in counseling, explanation of diagnosis, planning of further management, and coordination of care.  MEDICATIONS: Scheduled Meds: . busPIRone  10 mg Oral TID  . calcium carbonate  2 tablet Oral BID  . citalopram  20 mg Oral Daily  . docusate sodium  100 mg Oral BID  . famotidine  40 mg Oral QHS  . gabapentin  200 mg Oral QHS  . midodrine  10 mg Oral TID WC  . octreotide  150 mcg Subcutaneous Q8H  . primidone  50 mg Oral BID   Continuous Infusions: . albumin human 50 g (03/30/18 1103)   PRN Meds:.albuterol, ondansetron **OR** ondansetron (ZOFRAN) IV   PHYSICAL EXAM: Vital signs: Vitals:   03/30/18 0500 03/30/18 0645 03/30/18 1212 03/30/18 1236  BP:   (!) 113/45 (!) 106/42  Pulse:      Resp:      Temp:      TempSrc:      SpO2:      Weight: 72.7 kg 72.7 kg    Height:       Filed Weights   03/29/18 0500 03/30/18 0500 03/30/18 0645  Weight: 68 kg 72.7 kg 72.7 kg   Body mass index is 29.3 kg/m.   General appearance:Awake, alert, not in any distress.  Frail-appearing Eyes:no scleral icterus. HEENT: Atraumatic and Normocephalic Neck: supple, no JVD. Resp: Decreased air entry on the left-but no added sounds CVS: S1 S2 regular, no murmurs.  GI: Bowel sounds present, nondistended but dull in the flanks. Extremities: B/L Lower Ext shows trace edema, both legs are warm to touch Neurology:  Non focal Psychiatric: Normal judgment and insight. Normal mood. Musculoskeletal:No digital cyanosis Skin:No Rash, warm and dry Wounds:N/A  I  have personally reviewed following labs and imaging studies  LABORATORY DATA: CBC: Recent Labs  Lab 03/24/18 0458 03/26/18 2000 03/28/18 2130 03/30/18 0550  WBC 4.3 4.5 4.9 2.4*  HGB 8.8* 9.1* 9.1* 7.4*  HCT 27.9* 28.7* 27.3* 22.3*  MCV 106.9* 106.7* 101.5* 102.3*  PLT 89* 86* 81* 54*    Basic Metabolic Panel: Recent Labs  Lab 03/24/18 0458 03/25/18 1422 03/26/18 2000 03/28/18 2130  03/30/18 0550  NA 134* 134* 136 133* 135  K 5.5* 5.2* 5.4* 5.1 4.3  CL 107 105 105 103 107  CO2 24 24 23 23 24   GLUCOSE 79 107* 97 90 79  BUN 42* 42* 39* 45* 44*  CREATININE 2.37* 2.17* 2.21* 2.32* 2.06*  CALCIUM 8.6* 8.6* 8.9 8.8* 8.8*    GFR: Estimated Creatinine Clearance: 26.8 mL/min (A) (by C-G formula based on SCr of 2.06 mg/dL (H)).  Liver Function Tests: Recent Labs  Lab 03/26/18 2000 03/28/18 2130 03/30/18 0550  AST 43* 43* 30  ALT 32 31 24  ALKPHOS 82 87 61  BILITOT 1.1 1.2 1.3*  PROT 6.0* 5.9* 5.5*  ALBUMIN 2.6* 2.3* 3.0*   No results for input(s): LIPASE, AMYLASE in the last 168 hours. Recent Labs  Lab 03/26/18 2000  AMMONIA 28    Coagulation Profile: Recent Labs  Lab 03/26/18 2000  INR 1.4*    Cardiac Enzymes: No results for input(s): CKTOTAL, CKMB, CKMBINDEX, TROPONINI in the last 168 hours.  BNP (last 3 results) No results for input(s): PROBNP in the last 8760 hours.  HbA1C: No results for input(s): HGBA1C in the last 72 hours.  CBG: No results for input(s): GLUCAP in the last 168 hours.  Lipid Profile: No results for input(s): CHOL, HDL, LDLCALC, TRIG, CHOLHDL, LDLDIRECT in the last 72 hours.  Thyroid Function Tests: No results for input(s): TSH, T4TOTAL, FREET4, T3FREE, THYROIDAB in the last 72 hours.  Anemia Panel: No results for input(s): VITAMINB12, FOLATE, FERRITIN, TIBC, IRON, RETICCTPCT in the last 72 hours.  Urine analysis:    Component Value Date/Time   COLORURINE YELLOW 03/29/2018 2058   APPEARANCEUR CLEAR 03/29/2018 2058   LABSPEC 1.012 03/29/2018 2058   PHURINE 5.0 03/29/2018 2058   GLUCOSEU NEGATIVE 03/29/2018 2058   HGBUR LARGE (A) 03/29/2018 2058   BILIRUBINUR NEGATIVE 03/29/2018 2058   [place] NEGATIVE 03/29/2018 2058   PROTEINUR NEGATIVE 03/29/2018 2058   NITRITE NEGATIVE 03/29/2018 2058   LEUKOCYTESUR TRACE (A) 03/29/2018 2058    Sepsis Labs: Lactic Acid, Venous No results found for:  LATICACIDVEN  MICROBIOLOGY: No results found for this or any previous visit (from the past 240 hour(s)).  RADIOLOGY STUDIES/RESULTS: Ct Abdomen Pelvis Wo Contrast  Result Date: 03/20/2018 CLINICAL DATA:  History of cirrhosis of liver, dyspnea. Hepatic hydrothorax. Fluid with lymphocytes requiring rule out lymphoma. EXAM: CT ABDOMEN AND PELVIS WITHOUT CONTRAST TECHNIQUE: Multidetector CT imaging of the abdomen and pelvis was performed following the standard protocol without IV contrast. COMPARISON:  CT abdomen dated 03/06/2018 FINDINGS: Lower chest: Again noted is a large LEFT pleural effusion, incompletely imaged. Lingular consolidation is likely associated atelectasis. Hepatobiliary: Cirrhotic appearing liver. Gallstones are present within the nondistended gallbladder. No bile duct dilatation seen. Pancreas: Unremarkable. No pancreatic ductal dilatation or surrounding inflammatory changes. Spleen: Normal in size without focal abnormality. Adrenals/Urinary Tract: Kidneys are unremarkable without mass, stone or hydronephrosis. No perinephric fluid. No ureteral or bladder calculi identified. Bladder is obscured by overlying ascites. Stomach/Bowel: No dilated large or small bowel loops. There is probable small bowel  malrotation. Appendix is normal. Stomach is unremarkable. Vascular/Lymphatic: Aortic atherosclerosis. Enlarged lymph node adjacent to the pancreatic head, 1.6 cm, better seen on the recent contrast-enhanced CT of 03/06/2018. No other enlarged lymph nodes appreciated in the abdomen or pelvis. Reproductive: Uterus appears normal. Ascites obscures visualization of the bilateral adnexa. Other: Moderate amount of ascites within the abdomen and pelvis, largest component within the pelvis. No evidence of circumscribed fluid collection or abscess. No free intraperitoneal air. Musculoskeletal: No acute or suspicious osseous finding. Superficial soft tissues are unremarkable. IMPRESSION: 1. Cirrhotic liver.  2. Moderate amount of ascites within the abdomen and pelvis, largest component within the pelvis. 3. Enlarged lymph node adjacent to the pancreatic head, measuring 1.6 cm, better seen on the recent contrast-enhanced CT of 03/06/2016, favor reactive over neoplastic etiology given the absence of additional lymphadenopathy. 4. Large LEFT pleural effusion, incompletely imaged. 5. Cholelithiasis without evidence of acute cholecystitis. Aortic Atherosclerosis (ICD10-I70.0). Electronically Signed   By: Franki Cabot M.D.   On: 03/20/2018 14:20   Dg Chest 1 View  Result Date: 03/30/2018 CLINICAL DATA:  Status post thoracentesis. EXAM: CHEST  1 VIEW COMPARISON:  03/27/2017 FINDINGS: Midline trachea. Normal heart size. Small right pleural effusion is suspected. The left-sided effusion has decreased. No pneumothorax. Improved aeration with decreased left-sided atelectasis. Mild subsegmental atelectasis remains at both lung bases. IMPRESSION: Decreased left pleural effusion, without pneumothorax. Improved aeration with mild bibasilar atelectasis remaining. Electronically Signed   By: Abigail Miyamoto M.D.   On: 03/30/2018 13:47   Dg Chest 1 View  Result Date: 03/26/2018 CLINICAL DATA:  Shortness of breath. EXAM: CHEST  1 VIEW COMPARISON:  Multiple prior exams most recent radiograph 03/24/2018. Most recent CT 03/14/2018 FINDINGS: Known left pleural effusion has increased in size over the past 2 days, now large in size. Associated compressive atelectasis throughout the left lung. Mild rightward mediastinal shift versus rotation. Only a small portion of aerated left perihilar lung. Hypoventilatory right lung without acute finding. No visualized pneumothorax. IMPRESSION: Increased size of known left pleural effusion, now large in size. This causes mild mass effect in the mediastinum with rightward mediastinal shift. Electronically Signed   By: Keith Rake M.D.   On: 03/26/2018 19:37   Dg Chest 1 View  Result Date:  03/14/2018 CLINICAL DATA:  Status post thoracentesis EXAM: CHEST  1 VIEW COMPARISON:  March 14, 2018 12:56 p.m. FINDINGS: The heart size and mediastinal contours are within normal limits. There is small left pleural effusion significantly decreased compared prior exam. Consolidation left lung base is noted. There is no pneumothorax. The right lung is clear. The visualized skeletal structures are stable. IMPRESSION: There is small left pleural effusion, significantly decreased compared prior exam. Consolidation of left lung base is identified. There is no pneumothorax. Electronically Signed   By: Abelardo Diesel M.D.   On: 03/14/2018 16:19   Ct Chest Wo Contrast  Result Date: 03/14/2018 CLINICAL DATA:  Pleural effusion and dyspnea since yesterday. EXAM: CT CHEST WITHOUT CONTRAST TECHNIQUE: Multidetector CT imaging of the chest was performed following the standard protocol without IV contrast. COMPARISON:  03/14/2018 CXR FINDINGS: Cardiovascular: Common arterial branch of the right brachiocephalic left common carotid arteries. Atherosclerosis of left subclavian artery origin. Nonaneurysmal atherosclerotic aorta. The unenhanced pulmonary vessels are unremarkable. The heart size is normal with small anterior pericardial effusion without thickening noted. Mediastinum/Nodes: Small subcentimeter prevascular and paratracheal lymph nodes. 1 cm short axis subcarinal lymph node. Patent trachea and mainstem bronchi. Unremarkable CT appearance of the esophagus. The  thyroid gland is unremarkable without dominant mass. Lungs/Pleura: Moderate to large layering left effusion with adjacent atelectasis. The right lung is clear. No pneumothorax or dominant mass. Upper Abdomen: Cirrhotic appearance of the liver with moderate to large volume of ascites. Musculoskeletal: No chest wall mass or suspicious bone lesions identified. Mild thoracic spondylosis. IMPRESSION: 1. Moderate to large layering left pleural effusion with adjacent  atelectasis. 2. Cirrhotic liver with moderate to large volume of ascites. Aortic Atherosclerosis (ICD10-I70.0). Electronically Signed   By: Ashley Royalty M.D.   On: 03/14/2018 22:17   US Abdomen Complete  Result Date: 03/04/2018 CLINICAL DATA:  63 year old with hepatic cirrhosis. EXAM: ABDOMEN ULTRASOUND COMPLETE COMPARISON:  Visualized upper abdomen on CT chest 02/18/2018. FINDINGS: Gallbladder: Numerous shadowing gallstones, the largest measuring approximately 9 mm. Echogenic sludge. Mild gallbladder wall thickening up to approximately 4 mm. No pericholecystic fluid. Negative sonographic Murphy's sign according to the ultrasound technologist. Common bile duct: Diameter: Approximately 4 mm. No visible bile duct stones. Liver: Diffusely coarsened echotexture and irregular hepatic contour. No focal hepatic parenchymal abnormalities. Mildly increased echotexture diffusely. Portal vein is patent on color Doppler imaging with normal direction of blood flow towards the liver. IVC: Patent. Pancreas: Hypoechoic mass adjacent to or involving the pancreatic head or porta hepatis lymph node measuring approximately 2.4 x 1.4 x 2.8 cm. Visualized pancreas otherwise unremarkable. The tail is obscured by overlying bowel gas. The pancreas was not included on the recent CT chest. Spleen: Enlarged, measuring approximately 15.8 x 15.8 x 5.2 cm, giving a volume of approximately 681 mL. No focal parenchymal abnormality. Right Kidney: Length: Approximately 9.0 cm. No hydronephrosis. Well-preserved cortex. Mildly echogenic parenchyma. Approximate 1.2 x 1.1 x 2.0 cm mildly complex cyst with a thin internal septation involving the UPPER pole of the RIGHT kidney. No solid renal mass. Left Kidney: Length: Approximately 8.2 cm. No hydronephrosis. Well-preserved cortex. Mildly echogenic parenchyma. No focal parenchymal abnormalities. Abdominal aorta: Normal in caliber throughout its visualized course in the abdomen with evidence of  atherosclerosis. Maximum diameter 2.4 cm. Other findings: Small amount of ascites. Large LEFT pleural effusion. IMPRESSION: 1. Hepatic cirrhosis.  No focal hepatic parenchymal abnormality. 2. Hypoechoic mass adjacent to or involving the pancreatic head versus enlarged porta hepatis lymph node. The pancreas was not included on the recent CT chest, so a CT of the abdomen and pelvis with contrast is recommended in further evaluation. 3. Cholelithiasis.  No sonographic evidence of acute cholecystitis. 4. Splenomegaly without focal splenic parenchymal abnormality, indicating portal hypertension. 5. Patent portal vein with normal antegrade hepatopetal flow. 6. Small amount of ascites. 7. Large LEFT pleural effusion. Electronically Signed   By: Evangeline Dakin M.D.   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 Plan for nuclear medicine ascites - hydrothorax examination. Plan for an ultrasound-guided paracentesis and injection of radiopharmaceutical into the ascites. Left thoracentesis was performed prior to this procedure. EXAM: ULTRASOUND GUIDED PARACENTESIS INJECTION OF RADIOPHARMACEUTICAL INTO ASCITES MEDICATIONS: None. COMPLICATIONS: None immediate. PROCEDURE: Informed written consent was obtained from the patient after a discussion of the risks, benefits and alternatives to treatment. A timeout was performed prior to the initiation of the procedure. Initial ultrasound scanning demonstrates a small amount of ascites within the right upper abdominal quadrant. The right upper abdomen was prepped and draped in the usual sterile fashion. 1% lidocaine with was used for local anesthesia. Following this, a 6 Fr Safe-T-Centesis catheter was introduced. An ultrasound image was  saved for documentation purposes. The paracentesis was performed. 60 mL of opaque yellow fluid was removed. The radiopharmaceutical was injected through the  Safe-T-Centesis catheter. Catheter was flushed with sterile saline. The catheter was removed and a dressing was applied. The patient tolerated the procedure well without immediate post procedural complication. FINDINGS: Small amount of fluid around the liver. Small amount of fluid in the left lower quadrant. 60 mL of fluid was removed from the right upper quadrant. The radiopharmaceutical was injected into the right upper quadrant. IMPRESSION: Successful ultrasound-guided paracentesis yielding 60 mL of ascites. Fluid was sent for labs. Radiopharmaceutical was injected through the paracentesis catheter for the nuclear medicine examination. Electronically Signed   By: Markus Daft M.D.   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 Patient is being evaluated for a hepatic hydrothorax. EXAM: NUCLEAR MEDICINE SULFUR COLLOID PERITONEAL SCINTIGRAPHY TECHNIQUE: Ultrasound-guided left thoracentesis was performed. 1.1 L of left pleural fluid was removed but not all of the fluid was removed. Subsequently, an ultrasound-guided paracentesis was performed in the right upper quadrant. 60 mL of peritoneal fluid was removed. Technetium 99 M sulfur colloid was injected through the paracentesis catheter. Paracentesis catheter was removed. RADIOPHARMACEUTICALS:  7 millicuries technetium 99 M sulfur colloid COMPARISON:  Chest CT 03/14/2018 FINDINGS: Imaging was obtained over 1 hour. The initial images demonstrated radiopharmaceutical throughout the peritoneal cavity. The largest peritoneal pockets are in the right upper quadrant and left lower quadrant. Over time, radiopharmaceutical starts to accumulate in the left hemithorax. Findings are compatible with a trans diaphragmatic connection between the peritoneal cavity and the left pleural space. IMPRESSION: Study is positive for connection between the peritoneal  space and left pleural space. 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 Electronically Signed   By: Markus Daft M.D.   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 Plan for nuclear medicine ascites - hydrothorax examination. Plan for an ultrasound-guided paracentesis and injection of radiopharmaceutical into the ascites. Left thoracentesis was performed prior to this procedure. EXAM: ULTRASOUND GUIDED PARACENTESIS INJECTION OF RADIOPHARMACEUTICAL INTO ASCITES MEDICATIONS: None. COMPLICATIONS: None immediate. PROCEDURE: Informed written consent was obtained from the patient after a discussion of the risks, benefits and alternatives to treatment. A timeout was performed prior to the initiation of the procedure. Initial ultrasound scanning demonstrates a small amount of ascites within the right upper abdominal quadrant. The right upper abdomen was prepped and draped in the usual sterile fashion. 1% lidocaine with was used for local anesthesia. Following this, a 6 Fr Safe-T-Centesis catheter was introduced. An ultrasound image was saved for documentation purposes. The paracentesis was performed. 60 mL of opaque yellow fluid was removed. The radiopharmaceutical was injected through the Safe-T-Centesis catheter. Catheter was flushed with sterile saline. The catheter was removed and a dressing was applied. The patient tolerated the procedure well without immediate post procedural complication. FINDINGS: Small amount of fluid around the liver. Small amount of fluid in the left lower quadrant. 60 mL of fluid was removed from the right upper quadrant. The radiopharmaceutical was injected into the right upper quadrant. IMPRESSION: Successful ultrasound-guided paracentesis yielding 60 mL of ascites. Fluid was sent for labs. Radiopharmaceutical was injected through the paracentesis catheter for the nuclear  medicine examination. Electronically Signed   By: Markus Daft M.D.   On: 03/19/2018 17:06   Ct  Abdomen W Wo Contrast  Result Date: 03/06/2018 CLINICAL DATA:  Pancreatic mass seen on earlier [place]. Pancreatic mass workup . Cirrhosis. Epigastric and LUQ pain. 19m of Omni 300 used. Pt could only tolerate laying on her left side for scan.^1070mOMNIPAQUE IOHEXOL 300 MG/ML SOLNPancreatic mass EXAM: CT ABDOMEN WITHOUT AND WITH CONTRAST TECHNIQUE: Multidetector CT imaging of the abdomen was performed following the standard protocol before and following the bolus administration of intravenous contrast. CONTRAST:  10029mMNIPAQUE IOHEXOL 300 MG/ML  SOLN COMPARISON:  Ultrasound 03/04/2010 FINDINGS: Lower chest:  Large LEFT pleural effusion with passive atelectasis. Hepatobiliary: Nodular liver. Caudate lobe is enlarged. Portal veins are patent. Multiple gallstones within lumen gallbladder. No enhancing hepatic lesion. No intrahepatic or extrahepatic biliary duct dilatation. Pancreas: No abnormality of the pancreatic head body or tail. No duct dilatation. Periampullary duodenum diverticulum noted. Lymph node position inferior to the pancreas adjacent to the portal vein measures 16 mm (image 49/4) Spleen: Spleen borderline enlarged. Adrenals/urinary tract: LEFT kidney is normal. RIGHT kidney normal. No adrenal abnormality Stomach/Bowel: Stomach and limited of the small bowel is unremarkable Vascular/Lymphatic: Abdominal aortic normal caliber. No retroperitoneal periportal lymphadenopathy. Musculoskeletal: No aggressive osseous lesion IMPRESSION: 1. No pancreatic lesion identified. 2. Peripancreatic lymph node is mildly enlarged. 3. Small periampullary duodenum diverticulum present. 4. Morphologic changes in liver consistent with cirrhosis. No biliary duct dilatation of the intrahepatic or extrahepatic bile ducts. 5. No enhancing hepatic lesion. 6. Cholelithiasis. 7. Moderate volume of intraperitoneal free fluid. 8. Large LEFT  pleural effusion Electronically Signed   By: SteSuzy BouchardD.   On: 03/06/2018 16:31   Dg Bone Density (dxa)  Result Date: 03/11/2018 EXAM: DUAL X-RAY ABSORPTIOMETRY (DXA) FOR BONE MINERAL DENSITY IMPRESSION: Dear Dr, RosLutricia Feilour patient LesLynnelle Mesmermpleted a FRAX assessment on 03/11/2018 using the LunMiddle Pointnalysis version: 14.10) manufactured by [employer]he following summarizes the results of our evaluation. PATIENT BIOGRAPHICAL: Name: LapArely, Tinnertient ID: [id number] Date: 02/Nov 05, 1957ight:    62.5 in. Gender:     Female    [age]       Weight:    158.4 lbs. Ethnicity:  White                            Exam Date: 03/11/2018 FRAX* RESULTS:  (version: 3.5) 10-year Probability of Fracture1 Major Osteoporotic Fracture2 Hip Fracture 8.1% 0.7% Population: [place]) Risk Factors: None Based on Femur (Left) Neck BMD 1 -The 10-year probability of fracture may be lower than reported if the patient has received treatment. 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 The probability of a hip fracture is 0.7% within the next ten years. . Technologist: SCE PATIENT BIOGRAPHICAL: Name: LapEudelia, Hiltunentient ID: [id number] Date: 02/04-Jan-1957ight: [age] in. Gender: Female Exam Date: 03/11/2018 Weight: 158.4 lbs. Indications: Asthma, Caucasian, Cirrhosis, Early Menopause, Postmenopausal, Previous Smoker Fractures: Treatments: Gabapentin, primidone ASSESSMENT: The BMD measured at Femur Neck Left is 0.833 g/cm2 with a T-score of -1.5. This patient is considered osteopenic according to Wor[facility]HP[facility]riteria. The quality of the scan is good. Site Region  Measured Measured WHO Young Adult BMD Date  Age      Classification T-score AP Spine L1-L4 03/11/2018 61.0 Osteopenia -1.5 1.007 g/cm2 DualFemur Neck Left 03/11/2018 61.0 Osteopenia -1.5 0.833 g/cm2 World Health Organization [facility]) criteria for post-menopausal, Caucasian Women: Normal:       T-score at or above -1 SD Osteopenia:   T-score between -1 and -2.5 SD Osteoporosis: T-score at or below -2.5 SD RECOMMENDATIONS: 1. All patients should optimize calcium and vitamin D intake. 2. Consider FDA-approved medical therapies in postmenopausal women and men aged 6 years and older, based on the following: a. A hip or vertebral(clinical or morphometric) fracture b. T-score < -2.5 at the femoral neck or spine after appropriate evaluation to exclude secondary causes c. Low bone mass (T-score between -1.0 and -2.5 at the femoral neck or spine) and a 10-year probability of a hip fracture > 3% or a 10-year probability of a major osteoporosis-related fracture > 20% based on the [place]-adapted WHO algorithm d. Clinician judgment and/or patient preferences may indicate treatment for people with 10-year fracture probabilities above or below these levels FOLLOW-UP: People with diagnosed cases of osteoporosis or at high risk for fracture should have regular bone mineral density tests. For patients eligible for Medicare, routine testing is allowed once every 2 years. The testing frequency can be increased to one year for patients who have rapidly progressing disease, those who are receiving or discontinuing medical therapy to restore bone mass, or have additional risk factors. I have reviewed this report, and agree with the above findings. [facility] Electronically Signed   By: Lowella Grip III M.D.   On: 03/11/2018 11:23   Dg Chest Port 1 View  Result Date: 03/28/2018 CLINICAL DATA:  Recurrent effusion EXAM: PORTABLE CHEST 1 VIEW COMPARISON:  03/27/2018, 03/26/2018, 03/24/2018 FINDINGS: Interval diffuse hazy  opacity left thorax likely due to layering effusion, moderate in size. This is increased compared to prior. Underlying edema not excluded. Stable cardiomediastinal silhouette. Left basilar airspace disease. IMPRESSION: Increased moderate left pleural effusion with development of diffuse hazy opacity in the left thorax which may reflect layering fluid and or edema. Increased airspace disease at the left base. Electronically Signed   By: Donavan Foil M.D.   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 EXAM: PORTABLE CHEST 1 VIEW COMPARISON:  Chest radiograph-03/26/2018 FINDINGS: Grossly unchanged cardiac silhouette and mediastinal contours. Interval reduction in persistent moderate size left-sided effusion post thoracentesis. No pneumothorax. Improved aeration of the left mid and lower lung with persistent left basilar heterogeneous/consolidative opacities. No new focal airspace opacities. No definite right-sided pleural effusion. No evidence of edema. No acute osseous abnormalities. IMPRESSION: 1. Interval reduction in persistent moderate size left-sided effusion post thoracentesis. No pneumothorax. 2. Improved aeration of the left lung base with persistent left basilar atelectasis. Electronically Signed   By: Sandi Mariscal M.D.   On: 03/27/2018 13:34   Dg Chest Port 1 View  Result Date: 03/24/2018 CLINICAL DATA:  Post left thoracentesis EXAM: PORTABLE CHEST 1 VIEW COMPARISON:  03/21/2018 FINDINGS: Moderate to large left pleural effusion minimally changed since prior study. No pneumothorax. Left basilar opacity, likely atelectasis. No confluent opacity on the right. Heart is upper limits normal in size. IMPRESSION: Moderate to large-sized left pleural effusion minimally changed. No pneumothorax. Continued left base atelectasis or infiltrate. Electronically Signed   By: Rolm Baptise M.D.   On:  03/24/2018 12:46   Dg Chest [facility]  1 View  Result Date: 03/19/2018 CLINICAL DATA:  Status post thoracentesis. EXAM: PORTABLE CHEST 1 VIEW COMPARISON:  03/14/2018 FINDINGS: 1702 hours. Moderate to large left pleural effusion noted without evidence for pneumothorax. Left base collapse/consolidation. The cardio pericardial silhouette is enlarged. The visualized bony structures of the thorax are intact. Telemetry leads overlie the chest. IMPRESSION: 1. No evidence for pneumothorax status post thoracentesis. 2. Moderate to large left pleural effusion. Electronically Signed   By: Misty Stanley M.D.   On: 03/19/2018 18:56   Dg Chest Portable 1 View  Result Date: 03/14/2018 CLINICAL DATA:  Shortness of breath. EXAM: PORTABLE CHEST 1 VIEW COMPARISON:  Chest x-ray 03/06/2018. FINDINGS: Mediastinum and hilar structures normal. Large left pleural effusion, increased in size from prior exam. Underlying left lung atelectasis/infiltrate can not be excluded. No pneumothorax. Heart size most likely stable. IMPRESSION: Large left pleural effusion, increased from prior exam. Electronically Signed   By: Marcello Moores  Register   On: 03/14/2018 13:19   Dg Chest Port 1 View  Result Date: 03/06/2018 CLINICAL DATA:  Post large volume left-sided thoracentesis. EXAM: PORTABLE CHEST 1 VIEW COMPARISON:  Chest radiograph-02/20/2018; CT abdomen pelvis-earlier same day FINDINGS: Interval reduction in persistent moderate size left-sided effusion post large volume thoracentesis. No pneumothorax. Improved aeration of left lung base with persistent left basilar heterogeneous/consolidative opacities. No definite evidence of right-sided pleural effusion. No new focal airspace opacities. Unchanged cardiac silhouette and mediastinal contours. No acute osseous abnormalities. IMPRESSION: Interval reduction in persistent moderate sized left-sided effusion post thoracentesis. No pneumothorax. Electronically Signed   By: Sandi Mariscal M.D.   On: 03/06/2018  16:02   Mm 3d Screen Breast Bilateral  Result Date: 03/12/2018 CLINICAL DATA:  Screening. EXAM: DIGITAL SCREENING BILATERAL MAMMOGRAM WITH TOMO AND CAD COMPARISON:  Previous exam(s). ACR Breast Density Category b: There are scattered areas of fibroglandular density. FINDINGS: There are no findings suspicious for malignancy. Images were processed with CAD. IMPRESSION: No mammographic evidence of malignancy. A result letter of this screening mammogram will be mailed directly to the patient. RECOMMENDATION: Screening mammogram in one year. (Code:SM-B-01Y) BI-RADS CATEGORY  1: Negative. Electronically Signed   By: Dorise Bullion III M.D   On: 03/12/2018 14:44   Nm Hepato Biliary Leak  Result Date: 03/17/2018 CLINICAL DATA:  Cholelithiasis. Nausea and vomiting. Hepatic cirrhosis. EXAM: NUCLEAR MEDICINE HEPATOBILIARY IMAGING TECHNIQUE: Sequential images of the abdomen were obtained out to 60 minutes following intravenous administration of radiopharmaceutical. RADIOPHARMACEUTICALS:  5.1 mCi Tc-58m Choletec IV COMPARISON:  CT on 03/14/2018 FINDINGS: Delayed uptake and biliary excretion of activity by the liver is seen, consistent with known hepatic cirrhosis. Gallbladder activity is visualized, consistent with patency of cystic duct. Biliary activity passes into small bowel, consistent with patent common bile duct. IMPRESSION: No evidence of acute cholecystitis or biliary ductal dilatation. Hepatocellular dysfunction, consistent with hepatic cirrhosis. Electronically Signed   By: JEarle GellM.D.   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 Request for therapeutic thoracentesis today in IR. EXAM: ULTRASOUND GUIDED LEFT THORACENTESIS MEDICATIONS: 10 mL 1% lidocaine. COMPLICATIONS: None immediate. PROCEDURE: An ultrasound guided thoracentesis was thoroughly  discussed with the patient and questions answered. The benefits, risks, alternatives and complications were also discussed. The patient understands and wishes to proceed with the procedure. Written consent was obtained. Ultrasound was performed to localize and mark an adequate pocket of fluid in the left  chest. The area was then prepped and draped in the normal sterile fashion. 1% Lidocaine was used for local anesthesia. Under ultrasound guidance a 6 Fr Safe-T-Centesis catheter was introduced. Thoracentesis was performed. The catheter was removed and a dressing applied. FINDINGS: A total of approximately 2.1 L of chylous fluid was removed. IMPRESSION: Successful ultrasound guided left thoracentesis yielding 2.1 L of pleural fluid. Read by Candiss Norse, PA-C Electronically Signed   By: Jerilynn Mages.  Shick M.D.   On: 03/30/2018 12:54   US Thoracentesis Asp Pleural Space W/img Guide  Result Date: 03/27/2018 INDICATION: History of left-sided hepatic hydrothorax now with recurrent symptomatic left-sided pleural effusion. Please perform ultrasound-guided thoracentesis for therapeutic purposes. EXAM: US THORACENTESIS ASP PLEURAL SPACE W/IMG GUIDE COMPARISON:  Chest radiograph-03/26/2018 MEDICATIONS: None. COMPLICATIONS: None immediate. TECHNIQUE: Informed written consent was obtained from the patient after a discussion of the risks, benefits and alternatives to treatment. A timeout was performed prior to the initiation of the procedure. Initial ultrasound scanning demonstrates a large minimally complex though predominantly anechoic left-sided pleural effusion. The lower chest was prepped and draped in the usual sterile fashion. 1% lidocaine was used for local anesthesia. An ultrasound image was saved for documentation purposes. An 8 Fr Safe-T-Centesis catheter was introduced. The thoracentesis was performed. The catheter was removed and a dressing was applied. The patient tolerated the procedure well without immediate post  procedural complication. The patient was escorted to have an upright chest radiograph. FINDINGS: A total of approximately 2.2 liters of serous fluid was removed. IMPRESSION: Successful ultrasound-guided left sided thoracentesis yielding 2.2 liters of pleural fluid. Electronically Signed   By: Sandi Mariscal M.D.   On: 03/27/2018 13:37   US Thoracentesis Asp Pleural Space W/img Guide  Result Date: 03/24/2018 INDICATION: Patient with history of hepatic hydrothorax, cirrhosis and recurrent left pleural effusion who presents today originally for paracentesis in order to obtain flow cytometry to r/o malignancy prior to possible TIPS procedure. Limited abdominal US shows scant peritoneal fluid not amenable to percutaneous drainage. Diagnostic and therapeutic thoracentesis performed in order to obtain requested labs in lieu of paracentesis given patient's history of hepatic hydrothorax. EXAM: ULTRASOUND GUIDED LEFT THORACENTESIS MEDICATIONS: 20 mL 1% lidocaine. COMPLICATIONS: None immediate. PROCEDURE: An ultrasound guided thoracentesis was thoroughly discussed with the patient and questions answered. The benefits, risks, alternatives and complications were also discussed. The patient understands and wishes to proceed with the procedure. Written consent was obtained. Ultrasound was performed to localize and mark an adequate pocket of fluid in the left chest. The area was then prepped and draped in the normal sterile fashion. 1% Lidocaine was used for local anesthesia. Under ultrasound guidance a 6 Fr Safe-T-Centesis catheter was introduced. Thoracentesis was performed. The catheter was removed and a dressing applied. FINDINGS: A total of approximately 1.1 L of pink milky fluid was removed. Samples were sent to the laboratory as requested by the clinical team. IMPRESSION: Successful ultrasound guided left thoracentesis yielding 1.1 L of pleural fluid. Read by Candiss Norse, PA-C Electronically Signed   By: Lucrezia Europe  M.D.   On: 03/24/2018 12:49   US Thoracentesis Asp Pleural Space W/img Guide  Result Date: 03/19/2018 INDICATION: 63 year old with cirrhosis and recurrent left hydrothorax. Patient is being evaluated for a connection between the peritoneal and left pleural space with a nuclear medicine examination. Patient has a large amount of left pleural fluid on ultrasound examination. Plan to remove some of the left pleural fluid prior to the nuclear medicine examination. EXAM: ULTRASOUND GUIDED LEFT THORACENTESIS  MEDICATIONS: None. COMPLICATIONS: None immediate. PROCEDURE: An ultrasound guided thoracentesis was thoroughly discussed with the patient and questions answered. The benefits, risks, alternatives and complications were also discussed. The patient understands and wishes to proceed with the procedure. Written consent was obtained. Ultrasound was performed to localize and mark an adequate pocket of fluid in the left chest. The area was then prepped and draped in the normal sterile fashion. 1% Lidocaine was used for local anesthesia. Under ultrasound guidance a 6 Fr Safe-T-Centesis catheter was introduced. Thoracentesis was performed. The catheter was removed and a dressing applied. FINDINGS: A total of approximately 1.1 L of opaque yellow fluid was removed. Samples were sent to the laboratory as requested by the clinical team. IMPRESSION: Successful ultrasound guided left thoracentesis yielding 1.1 L of pleural fluid. Electronically Signed   By: Markus Daft M.D.   On: 03/19/2018 17:11   US Thoracentesis Asp Pleural Space W/img Guide  Result Date: 03/14/2018 CLINICAL DATA:  Recurrent left pleural effusion. EXAM: ULTRASOUND GUIDED LEFT THORACENTESIS COMPARISON:  None. PROCEDURE: An ultrasound guided thoracentesis was thoroughly discussed with the patient and questions answered. The benefits, risks, alternatives and complications were also discussed. The patient understands and wishes to proceed with the procedure.  Written consent was obtained. Ultrasound was performed to localize and mark an adequate pocket of fluid in the left chest. The area was then prepped and draped in the normal sterile fashion. 1% Lidocaine was used for local anesthesia. Under ultrasound guidance a 6 French Safe-T-Centesis catheter was introduced. Thoracentesis was performed. The catheter was removed and a dressing applied. COMPLICATIONS: None FINDINGS: A total of approximately 2.3 L of cloudy, yellowish-brown fluid was removed. A fluid sample was sent for laboratory analysis. IMPRESSION: Successful ultrasound guided left thoracentesis yielding 2.3 L of pleural fluid. Electronically Signed   By: Aletta Edouard M.D.   On: 03/14/2018 16:13   US Thoracentesis Asp Pleural Space W/img Guide  Result Date: 03/06/2018 INDICATION: History of cirrhosis now with recurrent symptomatic left-sided pleural effusion worrisome for hepatic hydrothorax. Please from ultrasound-guided thoracentesis for therapeutic purposes. EXAM: US THORACENTESIS ASP PLEURAL SPACE W/IMG GUIDE COMPARISON:  Ultrasound-guided thoracentesis-03/22/2018 (yielding 2.2 L of pleural fluid); CT abdomen pelvis-earlier same day MEDICATIONS: None. COMPLICATIONS: None immediate. TECHNIQUE: Informed written consent was obtained from the patient after a discussion of the risks, benefits and alternatives to treatment. A timeout was performed prior to the initiation of the procedure. Initial ultrasound scanning demonstrates a large anechoic left-sided pleural effusion. The lower chest was prepped and draped in the usual sterile fashion. 1% lidocaine was used for local anesthesia. An ultrasound image was saved for documentation purposes. An 8 Fr Safe-T-Centesis catheter was introduced. The thoracentesis was performed. Despite residual fluid within the left pleural space, the patient wished to terminate the procedure given left-sided chest pain. As such, the catheter was removed and a dressing was  applied. The patient tolerated the procedure well without immediate post procedural complication. The patient was escorted to have an upright chest radiograph. FINDINGS: A total of approximately 1.9 liters of amber colored serous fluid was removed. IMPRESSION: Successful ultrasound-guided left sided thoracentesis yielding 1.9 liters of pleural fluid. Electronically Signed   By: Sandi Mariscal M.D.   On: 03/06/2018 16:04     LOS: 2 days   Oren Binet, MD  [facility]  If 7PM-7AM, please contact night-coverage  Please page via [email or web address]  Go to [email or web address] and use [facility]'s universal password to access. If you do not have the password, please  contact the hospital operator.  Locate the [facility] provider you are looking for under [facility] and page to a number that you can be directly reached. If you still have difficulty reaching the provider, please page the [facility] (Director on Call) for the Hospitalists listed on amion for assistance.  03/30/2018, 2:09 PM

## 2018-03-30 NOTE — Procedures (Signed)
PROCEDURE SUMMARY:  Successful image-guided left thoracentesis. Yielded 2.1 liters of tan chylous fluid. Patient tolerated procedure well. EBL: Zero No immediate complications.  Specimen was not sent for labs. Post procedure CXR shows no pneumothorax.  Please see imaging section of Epic for full dictation.  Joaquim Nam PA-C 03/30/2018 12:49 PM

## 2018-03-30 NOTE — Progress Notes (Signed)
[facility] Progress Note  Subjective: SOB/ orthopnea worse this am.  Creat down to 2.0  Vitals:   03/29/18 2113 03/30/18 0452 03/30/18 0500 03/30/18 0645  BP: (!) 124/49 (!) 117/54    Pulse: 76 76    Resp: 16 16    Temp: 98 F (36.7 C) 98.2 F (36.8 C)    TempSrc: Oral Oral    SpO2: 99% 95%    Weight:   72.7 kg 72.7 kg  Height:        Inpatient medications: . busPIRone  10 mg Oral TID  . calcium carbonate  2 tablet Oral BID  . citalopram  20 mg Oral Daily  . docusate sodium  100 mg Oral BID  . famotidine  40 mg Oral QHS  . gabapentin  200 mg Oral QHS  . lidocaine (PF)      . midodrine  10 mg Oral TID WC  . octreotide  150 mcg Subcutaneous Q8H  . primidone  50 mg Oral BID   . albumin human 50 g (03/30/18 1103)   albuterol, ondansetron **OR** ondansetron (ZOFRAN) IV  Iron/TIBC/Ferritin/ %Sat No results found for: IRON, TIBC, FERRITIN, IRONPCTSAT  Exam: Chron ill appearing, no distress, nasal O2 No jvd, flat neck veins Chest cta bilat to bases Cor reg distant HS, no rub or gallop Abd mod obeseity, nontender, +bs, no ascites noted Ext no edema LE's or UE's Neuro no asterixis, Ox 3, gen'd weakness  Home meds:  - buspirone 10 tid/ citalopram 20 qd/ gabapentin 200 hs/ primidone 50 bid  - furosemide 20 qd  - famotidine 20 qd  - prn's/ vitamins   CXR large L effusion layering, no edema  CT abd 03/06/18 - Adrenals/urinary tract: LEFT kidney is normal. RIGHT kidney normal. No adrenal abnormality  Creat Jan 2020 1.60 Creat Feb 2020 1.59- 2.57 Creat  03/24/18  2.37            03/25/18  2.17                  03/26/18  2.21                  03/28/18  2.32  UA 11-20 rbc, no wbc, neg protein UNa < 10,  UCr 145  Assessment/ Rec: 1. 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 Creat up from baseline 1.6 in Jan. Creat down a bit to 2.0 w/ IVF"s, however SOB is worse. 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  Will dc IVF's and start  on midodrine/ octreotide, cont IV albumin.  Poor prognosis, consider pall care input.  2. NASH cirrhosis 3. Recurrent L pleural effusion 4. Hyperkalemia - renal diet 5. Low plts - due to #2  [facility] 03/30/2018, 12:22 PM  Recent Labs  Lab 03/26/18 2000 03/28/18 2130 03/30/18 0550  NA 136 133* 135  K 5.4* 5.1 4.3  CL 105 103 107  CO2 23 23 24   GLUCOSE 97 90 79  BUN 39* 45* 44*  CREATININE 2.21* 2.32* 2.06*  CALCIUM 8.9 8.8* 8.8*  ALBUMIN 2.6* 2.3* 3.0*  INR 1.4*  --   --    Recent Labs  Lab 03/28/18 2130 03/30/18 0550  AST 43* 30  ALT 31 24  ALKPHOS 87 61  BILITOT 1.2 1.3*  PROT 5.9* 5.5*   Recent Labs  Lab 03/28/18 2130 03/30/18 0550  WBC 4.9 2.4*  HGB 9.1* 7.4*  HCT 27.3* 22.3*  MCV 101.5*  102.3*  PLT 81* 54*

## 2018-03-31 LAB — CBC
HCT: 21.2 % — ABNORMAL LOW (ref 36.0–46.0)
Hemoglobin: 6.9 g/dL — CL (ref 12.0–15.0)
MCH: 33.2 pg (ref 26.0–34.0)
MCHC: 32.5 g/dL (ref 30.0–36.0)
MCV: 101.9 fL — ABNORMAL HIGH (ref 80.0–100.0)
PLATELETS: 49 10*3/uL — AB (ref 150–400)
RBC: 2.08 MIL/uL — ABNORMAL LOW (ref 3.87–5.11)
RDW: 13.3 % (ref 11.5–15.5)
WBC: 1.9 10*3/uL — ABNORMAL LOW (ref 4.0–10.5)
nRBC: 0 % (ref 0.0–0.2)

## 2018-03-31 LAB — BASIC METABOLIC PANEL
Anion gap: 5 (ref 5–15)
BUN: 38 mg/dL — ABNORMAL HIGH (ref 8–23)
CO2: 22 mmol/L (ref 22–32)
Calcium: 9 mg/dL (ref 8.9–10.3)
Chloride: 107 mmol/L (ref 98–111)
Creatinine, Ser: 1.94 mg/dL — ABNORMAL HIGH (ref 0.44–1.00)
GFR calc Af Amer: 32 mL/min — ABNORMAL LOW (ref >60)
GFR, EST NON AFRICAN AMERICAN: 27 mL/min — AB (ref >60)
GLUCOSE: 132 mg/dL — AB (ref 70–99)
Potassium: 4.9 mmol/L (ref 3.5–5.1)
Sodium: 134 mmol/L — ABNORMAL LOW (ref 135–145)

## 2018-03-31 LAB — ABO/RH: ABO/RH(D): O NEG

## 2018-03-31 LAB — PREPARE RBC (CROSSMATCH)

## 2018-03-31 MED ORDER — SODIUM CHLORIDE 0.9% IV SOLUTION: Freq: Once | INTRAVENOUS | Status: DC

## 2018-03-31 NOTE — Significant Event (Addendum)
CRITICAL VALUE ALERT  Critical Value:  6.9 Hgb   Date & Time Notied:  03/31/2018 0350  Provider Notified: Bodenheimer  Orders Received/Actions taken: 1 unit of blood was ordered.

## 2018-03-31 NOTE — Progress Notes (Signed)
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  She is followed by [facility] and is considered for TIPS procedure.  She was evaluated by nephrology and was found to have acute on chronic kidney disease.  In January her baseline serum creatinine appears to be 1.6 mg/dL she was placed on Midrin octreotide and IV albumin.  Her prognosis was thought to be poor.  Blood pressure 119/46 pulse 67 temperature 98.6 O2 sats 99% 2 L oxygen  Urine output 1.1 L 03/30/2018  She is receiving blood transfusion 03/31/2018  Sodium 134 potassium 4.9 chloride 107 CO2 22 BUN 38 creatinine 1.94 CO2 22 calcium 9.0 WBC 1.9 hemoglobin 6.9 platelets 49   Medications Midrin 10 mg 3 times daily, BuSpar 10 mg 3 times daily, Celexa 20 mg daily, octreotide 150 mg subcu every 8 hours, famotidine 40 mg daily, albumin 25% 50 g every 12 hours, gabapentin 200 mg nightly, primidone 50 mg twice daily  Objective:  Vital signs in last 24 hours:  Temp:  [97.4 F (36.3 C)-98.6 F (37 C)] 98.6 F (37 C) (03/09 0705) Pulse Rate:  [67-84] 67 (03/09 0705) Resp:  [18-20] 18 (03/09 0631) BP: (106-138)/(41-86) 119/46 (03/09 0705) SpO2:  [98 %-100 %] 99 % (03/09 0705) Weight:  [68.3 kg] 68.3 kg (03/09 0450)  Weight change: -4.434 kg Filed Weights   03/30/18 0500 03/30/18 0645 03/31/18 0450  Weight: 72.7 kg 72.7 kg 68.3 kg    Intake/Output: I/O last 3 completed shifts: In: -  Out: 1100 [Urine:1100]   Intake/Output this shift:  Total I/O In: 330 [Blood:330] Out: -  Chronically ill requiring oxygen CVS- RRR no murmurs rubs or gallops RS- CTA no wheezes or rales ABD- BS present soft non-distended EXT- no edema   Basic Metabolic Panel: Recent Labs  Lab 03/25/18 1422 03/26/18 2000 03/28/18 2130 03/30/18 0550 03/31/18 0208  NA 134* 136  133* 135 134*  K 5.2* 5.4* 5.1 4.3 4.9  CL 105 105 103 107 107  CO2 24 23 23 24 22   GLUCOSE 107* 97 90 79 132*  BUN 42* 39* 45* 44* 38*  CREATININE 2.17* 2.21* 2.32* 2.06* 1.94*  CALCIUM 8.6* 8.9 8.8* 8.8* 9.0    Liver Function Tests: Recent Labs  Lab 03/26/18 2000 03/28/18 2130 03/30/18 0550  AST 43* 43* 30  ALT 32 31 24  ALKPHOS 82 87 61  BILITOT 1.1 1.2 1.3*  PROT 6.0* 5.9* 5.5*  ALBUMIN 2.6* 2.3* 3.0*   No results for input(s): LIPASE, AMYLASE in the last 168 hours. Recent Labs  Lab 03/26/18 2000  AMMONIA 28    CBC: Recent Labs  Lab 03/26/18 2000 03/28/18 2130 03/30/18 0550 03/31/18 0208  WBC 4.5 4.9 2.4* 1.9*  HGB 9.1* 9.1* 7.4* 6.9*  HCT 28.7* 27.3* 22.3* 21.2*  MCV 106.7* 101.5* 102.3* 101.9*  PLT 86* 81* 54* 49*    Cardiac Enzymes: No results for input(s): CKTOTAL, CKMB, CKMBINDEX, TROPONINI in the last 168 hours.  BNP: Invalid input(s): POCBNP  CBG: No results for input(s): GLUCAP in the last 168 hours.  Microbiology: Results for orders placed or performed during the hospital encounter of 03/14/18  Body fluid culture     Status: None   Collection Time: 03/14/18  3:50 PM  Result Value Ref Range Status   Specimen Description   Final    PLEURAL Performed at  [facility], [address], [place], [place] [address]    Special Requests   Final    NONE Performed at [facility], [place], [place] [address]    Gram Stain NO WBC SEEN NO ORGANISMS SEEN   Final   Culture   Final    NO GROWTH 3 DAYS Performed at [facility], [place] [address], [place], [place] [address]    Report Status 03/18/2018 FINAL  Final  Body fluid culture     Status: None   Collection Time: 03/19/18  3:50 PM  Result Value Ref Range Status   Specimen Description   Final    PLEURAL Performed at [facility], [address], [place], [place] [address]    Special Requests   Final    NONE Performed at [facility], [address] [place], [place] [address]    Gram Stain   Final    FEW WBC PRESENT, PREDOMINANTLY MONONUCLEAR NO ORGANISMS SEEN    Culture   Final    NO GROWTH 3 DAYS Performed at [facility], [place] [address], [place], [place] [address]    Report Status 03/23/2018 FINAL  Final  Body fluid culture     Status: None   Collection Time: 03/19/18  4:00 PM  Result Value Ref Range Status   Specimen Description   Final    PERITONEAL Performed at [facility], [address], [place], [place] [address]    Special Requests   Final    NONE Performed at [facility], [address] [place], [place] [address]    Gram Stain   Final    RARE WBC PRESENT, PREDOMINANTLY MONONUCLEAR NO ORGANISMS SEEN    Culture   Final    NO GROWTH 3 DAYS Performed at [facility], [place] [address], [place], [place] [address]    Report Status 03/22/2018 FINAL  Final    Coagulation Studies: No results for input(s): LABPROT, INR in the last 72 hours.  Urinalysis: Recent Labs    03/29/18 2058  COLORURINE YELLOW  LABSPEC 1.012  PHURINE 5.0  GLUCOSEU NEGATIVE  HGBUR LARGE*  BILIRUBINUR NEGATIVE  KETONESUR NEGATIVE  PROTEINUR NEGATIVE  NITRITE NEGATIVE  LEUKOCYTESUR TRACE*      Imaging: Dg Chest 1 View  Result Date: 03/30/2018 CLINICAL DATA:  Status post thoracentesis. EXAM: CHEST  1 VIEW COMPARISON:  03/27/2017 FINDINGS: Midline trachea. Normal heart size. Small right pleural effusion is suspected. The left-sided effusion has decreased. No pneumothorax. Improved aeration with decreased left-sided atelectasis. Mild subsegmental atelectasis remains at both lung bases. IMPRESSION: Decreased left pleural effusion, without pneumothorax. Improved aeration with mild bibasilar atelectasis remaining. Electronically Signed   By: Abigail Miyamoto M.D.   On: 03/30/2018 13:47   US Renal  Result Date: 03/30/2018 CLINICAL DATA:  Renal failure.  Cirrhosis. EXAM: RENAL / URINARY  TRACT ULTRASOUND COMPLETE COMPARISON:  CT scan March 20, 2018 FINDINGS: Right Kidney: Renal measurements: 9 x 4.6 x 5.3 cm = volume: 114 mL. Contain in upper pole levin mm complex cysts. Contains a 7 mm lower pole cyst. Increased cortical echogenicity. Left Kidney: Renal measurements: 7.9 x 3.8 x 4.7 cm = volume: 73 mL. Increased cortical echogenicity. Bladder: Appears normal for degree of bladder distention. IMPRESSION: 1. Two small cysts in the right kidney, 1 of which is mildly complex. Medical renal disease with increased cortical echogenicity. No hydronephrosis. 2. Nodular liver consistent with cirrhosis.  Ascites.  3. The spleen is prominent measuring up to 13.2 cm. However, this volume of 357 cc does not meet the criteria for splenomegaly. Electronically Signed   By: Dorise Bullion III M.D   On: 03/30/2018 16:13   US Thoracentesis Asp Pleural Space W/img Guide  Result Date: 03/30/2018 INDICATION: Patient with history of hepatic hydrothorax and recurrent symptomatic left pleural effusion. Request for therapeutic thoracentesis today in IR. EXAM: ULTRASOUND GUIDED LEFT THORACENTESIS MEDICATIONS: 10 mL 1% lidocaine. COMPLICATIONS: None immediate. PROCEDURE: An ultrasound guided thoracentesis was thoroughly discussed with the patient and questions answered. The benefits, risks, alternatives and complications were also discussed. The patient understands and wishes to proceed with the procedure. Written consent was obtained. Ultrasound was performed to localize and mark an adequate pocket of fluid in the left chest. The area was then prepped and draped in the normal sterile fashion. 1% Lidocaine was used for local anesthesia. Under ultrasound guidance a 6 Fr Safe-T-Centesis catheter was introduced. Thoracentesis was performed. The catheter was removed and a dressing applied. FINDINGS: A total of approximately 2.1 L of chylous fluid was removed. IMPRESSION: Successful ultrasound guided left thoracentesis  yielding 2.1 L of pleural fluid. Read by Candiss Norse, PA-C Electronically Signed   By: Jerilynn Mages.  Shick M.D.   On: 03/30/2018 12:54     Medications:   . albumin human 50 g (03/31/18 1114)   . sodium chloride   Intravenous Once  . busPIRone  10 mg Oral TID  . calcium carbonate  2 tablet Oral BID  . citalopram  20 mg Oral Daily  . docusate sodium  100 mg Oral BID  . famotidine  40 mg Oral QHS  . gabapentin  200 mg Oral QHS  . midodrine  10 mg Oral TID WC  . octreotide  150 mcg Subcutaneous Q8H  . primidone  50 mg Oral BID   albuterol, ondansetron **OR** ondansetron (ZOFRAN) IV  Assessment/ Plan:   Acute kidney injury on chronic kidney disease with possible consideration of hepatorenal syndrome she is currently being treated with Midrin 10 mg 3 times daily, octreotide 150 mcg every 8 hours and albumin.  Urine sodium less than 10.  Urinalysis did reveal 11-20 RBCs.  No protein renal ultrasound revealed 2 small cysts in the right kidney no hydronephrosis and nodular cirrhotic liver and a prominent spleen.  Positive for ascites creatinine seems to be improving slowly.  Hypertension volume appears adequately controlled does not appear to be any ascites at this particular time.  Recurrent left-sided pleural effusion with hepatic hydrothorax required repeated thoracenteses TIPS procedure planned  Decompensated liver cirrhosis possible hepatorenal syndrome  Palliative care reconfirmed DNR not a dialysis candidate continue to follow   LOS: [place] @TODAY @11 :47 AM

## 2018-03-31 NOTE — Progress Notes (Signed)
IR following for possible TIPS procedure due to recurrent hydrothorax.  Patient with worsening renal function but is not a dialysis candidate.  Her SCr is now 1.94, MELDNa down to 21. She is a DNR and Palliative medicine has been consulted for [place] discussion in light of poor prognosis. Will continue to follow for ongoing improvement in her SCr as well as changes to care plan, if any.   Brynda Greathouse, MS RD PA-C 1:54 PM

## 2018-03-31 NOTE — Evaluation (Signed)
Physical Therapy Evaluation Patient Details Name: Cissy Galbreath MRN: [id number] DOB: 08-02-1955 Today's Date: 03/31/2018   History of Present Illness  Patient is a 63 y/o female from [facility] due to recurrent hepatic hydrothorax. Past medical history significant of HTN, liver cirrhosis. Admitted for hepatic hydrothorax with possible TIPS procedure.     Clinical Impression  Ms. Weimann is a very pleasant 63 y/o female admitted with the above listed diagnosis. Patient reports Mod I with mobility prior to admission. Patient today requiring general min guard assist for all functional mobility for general safety with primary subjective reports of general weakness limiting. PT to recommend HHPT at discharge to continue to progress safe and independent functional mobility in the home environment. PT to continue to follow acutely.     Follow Up Recommendations Home health PT;Supervision - Intermittent    Equipment Recommendations  None recommended by PT    Recommendations for Other Services       Precautions / Restrictions Precautions Precautions: Fall Restrictions Weight Bearing Restrictions: No      Mobility  Bed Mobility Overal bed mobility: Modified Independent             General bed mobility comments: increased time and effort  Transfers Overall transfer level: Needs assistance Equipment used: None Transfers: Sit to/from Stand;Stand Pivot Transfers Sit to Stand: Min guard Stand pivot transfers: Min guard       General transfer comment: for safety and immediate standing balance  Ambulation/Gait Ambulation/Gait assistance: Min guard Gait Distance (Feet): 100 Feet Assistive device: None Gait Pattern/deviations: Step-through pattern;Decreased stride length Gait velocity: decreased   General Gait Details: slow steady pace of gait; patient reporting general fatigue limiting  Stairs            Wheelchair Mobility    Modified Rankin (Stroke Patients Only)        Balance Overall balance assessment: Mild deficits observed, not formally tested                                           Pertinent Vitals/Pain Pain Assessment: No/denies pain    Home Living Family/patient expects to be discharged to:: Private residence Living Arrangements: Spouse/significant other Available Help at Discharge: Family;Available PRN/intermittently Type of Home: Apartment Home Access: Level entry     Home Layout: One level Home Equipment: Walker - 4 wheels Additional Comments: does not use rollator currently    Prior Function Level of Independence: Independent         Comments: has an "aide" on Monday and Friday for 3-4 hours that assist with housecleaning, meals, and provides supervision for bathing     Hand Dominance        Extremity/Trunk Assessment   Upper Extremity Assessment Upper Extremity Assessment: Defer to OT evaluation    Lower Extremity Assessment Lower Extremity Assessment: Generalized weakness    Cervical / Trunk Assessment Cervical / Trunk Assessment: Normal  Communication   Communication: No difficulties  Cognition Arousal/Alertness: Awake/alert Behavior During Therapy: WFL for tasks assessed/performed Overall Cognitive Status: Within Functional Limits for tasks assessed                                        General Comments      Exercises     Assessment/Plan    PT Assessment  Patient needs continued PT services  PT Problem List Decreased strength;Decreased activity tolerance;Decreased balance;Decreased mobility;Decreased knowledge of use of DME;Decreased safety awareness       PT Treatment Interventions DME instruction;Gait training;Functional mobility training;Therapeutic activities;Therapeutic exercise;Balance training;Patient/family education    PT Goals (Current goals can be found in the Care Plan section)  Acute Rehab PT Goals Patient Stated Goal: regain strength PT  Goal Formulation: With patient Time For Goal Achievement: 04/14/18 Potential to Achieve Goals: Good    Frequency Min 3X/week   Barriers to discharge        Co-evaluation               AM-PAC PT "6 Clicks" Mobility  Outcome Measure Help needed turning from your back to your side while in a flat bed without using bedrails?: None Help needed moving from lying on your back to sitting on the side of a flat bed without using bedrails?: A Little Help needed moving to and from a bed to a chair (including a wheelchair)?: A Little Help needed standing up from a chair using your arms (e.g., wheelchair or bedside chair)?: A Little Help needed to walk in hospital room?: A Little Help needed climbing 3-5 steps with a railing? : A Little 6 Click Score: 19    End of Session Equipment Utilized During Treatment: Gait belt;Oxygen Activity Tolerance: Patient limited by fatigue Patient left: in bed;with call bell/phone within reach Nurse Communication: Mobility status PT Visit Diagnosis: Muscle weakness (generalized) (M62.81);Unsteadiness on feet (R26.81)    Time: 1125-1200 PT Time Calculation (min) (ACUTE ONLY): 35 min   Charges:   PT Evaluation $PT Eval Moderate Complexity: 1 Mod PT Treatments $Gait Training: 8-22 mins        Lanney Gins, PT, DPT Supplemental Physical Therapist 03/31/18 12:13 PM Pager: [phone] Office: [phone]

## 2018-03-31 NOTE — Progress Notes (Signed)
PROGRESS NOTE        PATIENT DETAILS Name: Kelly Mclean Age: 63 y.o. Sex: female Date of Birth: 06/25/1955 Admit Date: 03/28/2018 Admitting Physician Etta Quill, DO KAJ:GOTL, Lemmie Evens, MD  Brief Narrative: Patient is a 63 y.o. 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  See below for further details  Subjective: Some mild shortness of breath-but denies any chest pain nausea vomiting.  Assessment/Plan: Recurrent left-sided pleural effusion secondary to hepatic hydrothorax: Required repeated thoracocentesis x 6 in the past 2 weeks.  Thought to have hepatic hydrothorax-IR following for TIPS procedure-scheduled over the next few days.  Pleural fluid cytology x3- for malignancy.  Decompensated liver cirrhosis: Secondary to NASH.  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  AKI on CKD stage III: Suspected to be hepatorenal syndrome-creatinine improving with octreotide, midodrine and IV albumin.  Continue to avoid nephrotoxic agents.  Nephrology following.   Anemia: Probably has anemia secondary to liver cirrhosis/CKD-worsened due to acute illness.  No evidence of overt blood loss-denies melena or hematochezia.  Due to worsening hemoglobin-1 unit of PRBC ordered-follow.  Thrombocytopenia: Suspect related to liver cirrhosis/hypersplenism.  Follow.  Deconditioning: Await PT/OT evaluation-May require SNF on discharge.  Depression/anxiety: Appears stable-continue BuSpar and Celexa.  Palliative care: Unfortunate 63 year old with [name] related liver cirrhosis-now with hepatic hydrothorax and worsening renal function.  Poor prognosi-due to worsening renal function-we will go ahead and consult palliative care.  Reconfirmed DNR earlier this morning.  DVT  Prophylaxis: SCD's  Code Status: DNR  Family Communication: None at bedside  Disposition Plan: Remain inpatient  Antimicrobial agents: Anti-infectives (From admission, onward)   None      Procedures: None  CONSULTS:  IR  Time spent: 25 minutes-Greater than 50% of this time was spent in counseling, explanation of diagnosis, planning of further management, and coordination of care.  MEDICATIONS: Scheduled Meds: . sodium chloride   Intravenous Once  . busPIRone  10 mg Oral TID  . calcium carbonate  2 tablet Oral BID  . citalopram  20 mg Oral Daily  . docusate sodium  100 mg Oral BID  . famotidine  40 mg Oral QHS  . gabapentin  200 mg Oral QHS  . midodrine  10 mg Oral TID WC  . octreotide  150 mcg Subcutaneous Q8H  . primidone  50 mg Oral BID   Continuous Infusions: . albumin human 50 g (03/31/18 1114)   PRN Meds:.albuterol, ondansetron **OR** ondansetron (ZOFRAN) IV   PHYSICAL EXAM: Vital signs: Vitals:   03/31/18 0343 03/31/18 0450 03/31/18 0631 03/31/18 0705  BP: (!) 107/45  (!) 112/41 (!) 119/46  Pulse: 68  67 67  Resp:   18   Temp:   98.4 F (36.9 C) 98.6 F (37 C)  TempSrc:   Oral Oral  SpO2:   98% 99%  Weight:  68.3 kg    Height:       Filed Weights   03/30/18 0500 03/30/18 0645 03/31/18 0450  Weight: 72.7 kg 72.7 kg 68.3 kg   Body mass index is 27.53 kg/m.   General appearance:Awake, alert, not in any distress.  Eyes:no scleral icterus. HEENT: Atraumatic and Normocephalic Neck: supple, no JVD. Resp: Decreased air entry on the left-but no rales or rhonchi.  CVS: S1 S2 regular, no murmurs.  GI: Bowel sounds present, Non tender and not distended with no gaurding, rigidity or rebound. Extremities: B/L Lower Ext shows no edema, both legs are warm to touch Neurology:  Non focal Psychiatric: Normal judgment and insight. Normal mood. Musculoskeletal:No digital cyanosis Skin:No Rash, warm and dry Wounds:N/A  I have personally reviewed  following labs and imaging studies  LABORATORY DATA: CBC: Recent Labs  Lab 03/26/18 2000 03/28/18 2130 03/30/18 0550 03/31/18 0208  WBC 4.5 4.9 2.4* 1.9*  HGB 9.1* 9.1* 7.4* 6.9*  HCT 28.7* 27.3* 22.3* 21.2*  MCV 106.7* 101.5* 102.3* 101.9*  PLT 86* 81* 54* 49*    Basic Metabolic Panel: Recent Labs  Lab 03/25/18 1422 03/26/18 2000 03/28/18 2130 03/30/18 0550 03/31/18 0208  NA 134* 136 133* 135 134*  K 5.2* 5.4* 5.1 4.3 4.9  CL 105 105 103 107 107  CO2 24 23 23 24 22   GLUCOSE 107* 97 90 79 132*  BUN 42* 39* 45* 44* 38*  CREATININE 2.17* 2.21* 2.32* 2.06* 1.94*  CALCIUM 8.6* 8.9 8.8* 8.8* 9.0    GFR: Estimated Creatinine Clearance: 27.6 mL/min (A) (by C-G formula based on SCr of 1.94 mg/dL (H)).  Liver Function Tests: Recent Labs  Lab 03/26/18 2000 03/28/18 2130 03/30/18 0550  AST 43* 43* 30  ALT 32 31 24  ALKPHOS 82 87 61  BILITOT 1.1 1.2 1.3*  PROT 6.0* 5.9* 5.5*  ALBUMIN 2.6* 2.3* 3.0*   No results for input(s): LIPASE, AMYLASE in the last 168 hours. Recent Labs  Lab 03/26/18 2000  AMMONIA 28    Coagulation Profile: Recent Labs  Lab 03/26/18 2000  INR 1.4*    Cardiac Enzymes: No results for input(s): CKTOTAL, CKMB, CKMBINDEX, TROPONINI in the last 168 hours.  BNP (last 3 results) No results for input(s): PROBNP in the last 8760 hours.  HbA1C: No results for input(s): HGBA1C in the last 72 hours.  CBG: No results for input(s): GLUCAP in the last 168 hours.  Lipid Profile: No results for input(s): CHOL, HDL, LDLCALC, TRIG, CHOLHDL, LDLDIRECT in the last 72 hours.  Thyroid Function Tests: No results for input(s): TSH, T4TOTAL, FREET4, T3FREE, THYROIDAB in the last 72 hours.  Anemia Panel: No results for input(s): VITAMINB12, FOLATE, FERRITIN, TIBC, IRON, RETICCTPCT in the last 72 hours.  Urine analysis:    Component Value Date/Time   COLORURINE YELLOW 03/29/2018 2058   APPEARANCEUR CLEAR 03/29/2018 2058   LABSPEC 1.012  03/29/2018 2058   PHURINE 5.0 03/29/2018 2058   GLUCOSEU NEGATIVE 03/29/2018 2058   HGBUR LARGE (A) 03/29/2018 2058   BILIRUBINUR NEGATIVE 03/29/2018 2058   [place] NEGATIVE 03/29/2018 2058   PROTEINUR NEGATIVE 03/29/2018 2058   NITRITE NEGATIVE 03/29/2018 2058   LEUKOCYTESUR TRACE (A) 03/29/2018 2058    Sepsis Labs: Lactic Acid, Venous No results found for: LATICACIDVEN  MICROBIOLOGY: No results found for this or any previous visit (from the past 240 hour(s)).  RADIOLOGY STUDIES/RESULTS: Ct Abdomen Pelvis Wo Contrast  Result Date: 03/20/2018 CLINICAL DATA:  History of cirrhosis of liver, dyspnea. Hepatic hydrothorax. Fluid with lymphocytes requiring rule out lymphoma. EXAM: CT ABDOMEN AND PELVIS WITHOUT CONTRAST TECHNIQUE: Multidetector CT imaging of the abdomen and pelvis was performed following the standard protocol without IV contrast. COMPARISON:  CT abdomen dated 03/06/2018 FINDINGS: Lower chest: Again noted is a large LEFT pleural effusion, incompletely imaged. Lingular consolidation is likely associated atelectasis. Hepatobiliary: Cirrhotic appearing liver. Gallstones are present within the nondistended gallbladder. No bile duct  dilatation seen. Pancreas: Unremarkable. No pancreatic ductal dilatation or surrounding inflammatory changes. Spleen: Normal in size without focal abnormality. Adrenals/Urinary Tract: Kidneys are unremarkable without mass, stone or hydronephrosis. No perinephric fluid. No ureteral or bladder calculi identified. Bladder is obscured by overlying ascites. Stomach/Bowel: No dilated large or small bowel loops. 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 Appendix is normal. Stomach is unremarkable. Vascular/Lymphatic: Aortic atherosclerosis. Enlarged lymph node adjacent to the pancreatic head, 1.6 cm, better seen on the recent contrast-enhanced CT of 03/06/2018. No other enlarged lymph nodes appreciated in the abdomen or pelvis. Reproductive: Uterus appears normal.  Ascites obscures visualization of the bilateral adnexa. Other: Moderate amount of ascites within the abdomen and pelvis, largest component within the pelvis. No evidence of circumscribed fluid collection or abscess. No free intraperitoneal air. Musculoskeletal: No acute or suspicious osseous finding. Superficial soft tissues are unremarkable. IMPRESSION: 1. Cirrhotic liver. 2. Moderate amount of ascites within the abdomen and pelvis, largest component within the pelvis. 3. Enlarged lymph node adjacent to the pancreatic head, measuring 1.6 cm, better seen on the recent contrast-enhanced CT of 03/06/2016, favor reactive over neoplastic etiology given the absence of additional lymphadenopathy. 4. Large LEFT pleural effusion, incompletely imaged. 5. Cholelithiasis without evidence of acute cholecystitis. Aortic Atherosclerosis (ICD10-I70.0). Electronically Signed   By: Franki Cabot M.D.   On: 03/20/2018 14:20   Dg Chest 1 View  Result Date: 03/30/2018 CLINICAL DATA:  Status post thoracentesis. EXAM: CHEST  1 VIEW COMPARISON:  03/27/2017 FINDINGS: Midline trachea. Normal heart size. Small right pleural effusion is suspected. The left-sided effusion has decreased. No pneumothorax. Improved aeration with decreased left-sided atelectasis. Mild subsegmental atelectasis remains at both lung bases. IMPRESSION: Decreased left pleural effusion, without pneumothorax. Improved aeration with mild bibasilar atelectasis remaining. Electronically Signed   By: Abigail Miyamoto M.D.   On: 03/30/2018 13:47   Dg Chest 1 View  Result Date: 03/26/2018 CLINICAL DATA:  Shortness of breath. EXAM: CHEST  1 VIEW COMPARISON:  Multiple prior exams most recent radiograph 03/24/2018. Most recent CT 03/14/2018 FINDINGS: Known left pleural effusion has increased in size over the past 2 days, now large in size. Associated compressive atelectasis throughout the left lung. Mild rightward mediastinal shift versus rotation. Only a small portion of  aerated left perihilar lung. Hypoventilatory right lung without acute finding. No visualized pneumothorax. IMPRESSION: Increased size of known left pleural effusion, now large in size. This causes mild mass effect in the mediastinum with rightward mediastinal shift. Electronically Signed   By: Keith Rake M.D.   On: 03/26/2018 19:37   Dg Chest 1 View  Result Date: 03/14/2018 CLINICAL DATA:  Status post thoracentesis EXAM: CHEST  1 VIEW COMPARISON:  March 14, 2018 12:56 p.m. FINDINGS: The heart size and mediastinal contours are within normal limits. There is small left pleural effusion significantly decreased compared prior exam. Consolidation left lung base is noted. There is no pneumothorax. The right lung is clear. The visualized skeletal structures are stable. IMPRESSION: There is small left pleural effusion, significantly decreased compared prior exam. Consolidation of left lung base is identified. There is no pneumothorax. Electronically Signed   By: Abelardo Diesel M.D.   On: 03/14/2018 16:19   Ct Chest Wo Contrast  Result Date: 03/14/2018 CLINICAL DATA:  Pleural effusion and dyspnea since yesterday. EXAM: CT CHEST WITHOUT CONTRAST TECHNIQUE: Multidetector CT imaging of the chest was performed following the standard protocol without IV contrast. COMPARISON:  03/14/2018 CXR FINDINGS: Cardiovascular: Common arterial branch of the right brachiocephalic left common  carotid arteries. Atherosclerosis of left subclavian artery origin. Nonaneurysmal atherosclerotic aorta. The unenhanced pulmonary vessels are unremarkable. The heart size is normal with small anterior pericardial effusion without thickening noted. Mediastinum/Nodes: Small subcentimeter prevascular and paratracheal lymph nodes. 1 cm short axis subcarinal lymph node. Patent trachea and mainstem bronchi. Unremarkable CT appearance of the esophagus. The thyroid gland is unremarkable without dominant mass. Lungs/Pleura: Moderate to large  layering left effusion with adjacent atelectasis. The right lung is clear. No pneumothorax or dominant mass. Upper Abdomen: Cirrhotic appearance of the liver with moderate to large volume of ascites. Musculoskeletal: No chest wall mass or suspicious bone lesions identified. Mild thoracic spondylosis. IMPRESSION: 1. Moderate to large layering left pleural effusion with adjacent atelectasis. 2. Cirrhotic liver with moderate to large volume of ascites. Aortic Atherosclerosis (ICD10-I70.0). Electronically Signed   By: Ashley Royalty M.D.   On: 03/14/2018 22:17   US Abdomen Complete  Result Date: 03/04/2018 CLINICAL DATA:  63 year old with hepatic cirrhosis. EXAM: ABDOMEN ULTRASOUND COMPLETE COMPARISON:  Visualized upper abdomen on CT chest 02/18/2018. FINDINGS: Gallbladder: Numerous shadowing gallstones, the largest measuring approximately 9 mm. Echogenic sludge. Mild gallbladder wall thickening up to approximately 4 mm. No pericholecystic fluid. Negative sonographic Murphy's sign according to the ultrasound technologist. Common bile duct: Diameter: Approximately 4 mm. No visible bile duct stones. Liver: Diffusely coarsened echotexture and irregular hepatic contour. No focal hepatic parenchymal abnormalities. Mildly increased echotexture diffusely. Portal vein is patent on color Doppler imaging with normal direction of blood flow towards the liver. IVC: Patent. Pancreas: Hypoechoic mass adjacent to or involving the pancreatic head or porta hepatis lymph node measuring approximately 2.4 x 1.4 x 2.8 cm. Visualized pancreas otherwise unremarkable. The tail is obscured by overlying bowel gas. The pancreas was not included on the recent CT chest. Spleen: Enlarged, measuring approximately 15.8 x 15.8 x 5.2 cm, giving a volume of approximately 681 mL. No focal parenchymal abnormality. Right Kidney: Length: Approximately 9.0 cm. No hydronephrosis. Well-preserved cortex. Mildly echogenic parenchyma. Approximate 1.2 x 1.1 x 2.0  cm mildly complex cyst with a thin internal septation involving the UPPER pole of the RIGHT kidney. No solid renal mass. Left Kidney: Length: Approximately 8.2 cm. No hydronephrosis. Well-preserved cortex. Mildly echogenic parenchyma. No focal parenchymal abnormalities. Abdominal aorta: Normal in caliber throughout its visualized course in the abdomen with evidence of atherosclerosis. Maximum diameter 2.4 cm. Other findings: Small amount of ascites. Large LEFT pleural effusion. IMPRESSION: 1. Hepatic cirrhosis.  No focal hepatic parenchymal abnormality. 2. Hypoechoic mass adjacent to or involving the pancreatic head versus enlarged porta hepatis lymph node. The pancreas was not included on the recent CT chest, so a CT of the abdomen and pelvis with contrast is recommended in further evaluation. 3. Cholelithiasis.  No sonographic evidence of acute cholecystitis. 4. Splenomegaly without focal splenic parenchymal abnormality, indicating portal hypertension. 5. Patent portal vein with normal antegrade hepatopetal flow. 6. Small amount of ascites. 7. Large LEFT pleural effusion. Electronically Signed   By: Evangeline Dakin M.D.   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 Plan for nuclear medicine ascites - hydrothorax examination. Plan for an ultrasound-guided paracentesis and injection of radiopharmaceutical into the ascites. Left thoracentesis was performed prior to this procedure. EXAM: ULTRASOUND GUIDED PARACENTESIS INJECTION OF RADIOPHARMACEUTICAL INTO ASCITES MEDICATIONS: None. COMPLICATIONS: None immediate. PROCEDURE: Informed written consent was obtained from the patient after a discussion of the risks, benefits and alternatives to treatment. A timeout was  performed prior to the initiation of the procedure. Initial ultrasound scanning demonstrates a small amount of ascites within the right upper abdominal quadrant.  The right upper abdomen was prepped and draped in the usual sterile fashion. 1% lidocaine with was used for local anesthesia. Following this, a 6 Fr Safe-T-Centesis catheter was introduced. An ultrasound image was saved for documentation purposes. The paracentesis was performed. 60 mL of opaque yellow fluid was removed. The radiopharmaceutical was injected through the Safe-T-Centesis catheter. Catheter was flushed with sterile saline. The catheter was removed and a dressing was applied. The patient tolerated the procedure well without immediate post procedural complication. FINDINGS: Small amount of fluid around the liver. Small amount of fluid in the left lower quadrant. 60 mL of fluid was removed from the right upper quadrant. The radiopharmaceutical was injected into the right upper quadrant. IMPRESSION: Successful ultrasound-guided paracentesis yielding 60 mL of ascites. Fluid was sent for labs. Radiopharmaceutical was injected through the paracentesis catheter for the nuclear medicine examination. Electronically Signed   By: Markus Daft M.D.   On: 03/19/2018 17:06   US Renal  Result Date: 03/30/2018 CLINICAL DATA:  Renal failure.  Cirrhosis. EXAM: RENAL / URINARY TRACT ULTRASOUND COMPLETE COMPARISON:  CT scan March 20, 2018 FINDINGS: Right Kidney: Renal measurements: 9 x 4.6 x 5.3 cm = volume: 114 mL. Contain in upper pole levin mm complex cysts. Contains a 7 mm lower pole cyst. Increased cortical echogenicity. Left Kidney: Renal measurements: 7.9 x 3.8 x 4.7 cm = volume: 73 mL. Increased cortical echogenicity. Bladder: Appears normal for degree of bladder distention. IMPRESSION: 1. Two small cysts in the right kidney, 1 of which is mildly complex. Medical renal disease with increased cortical echogenicity. No hydronephrosis. 2. Nodular liver consistent with cirrhosis.  Ascites. 3. The spleen is prominent measuring up to 13.2 cm. However, this volume of 357 cc does not meet the criteria for splenomegaly.  Electronically Signed   By: Dorise Bullion III M.D   On: 03/30/2018 16:13   Nm Interstitial Rad Source Applic Complex  Result Date: 03/19/2018 CLINICAL DATA:  63 year old with cirrhosis and recurrent left hydrothorax. Patient is being evaluated for a hepatic hydrothorax. EXAM: NUCLEAR MEDICINE SULFUR COLLOID PERITONEAL SCINTIGRAPHY TECHNIQUE: Ultrasound-guided left thoracentesis was performed. 1.1 L of left pleural fluid was removed but not all of the fluid was removed. Subsequently, an ultrasound-guided paracentesis was performed in the right upper quadrant. 60 mL of peritoneal fluid was removed. Technetium 99 M sulfur colloid was injected through the paracentesis catheter. Paracentesis catheter was removed. RADIOPHARMACEUTICALS:  7 millicuries technetium 99 M sulfur colloid COMPARISON:  Chest CT 03/14/2018 FINDINGS: Imaging was obtained over 1 hour. The initial images demonstrated radiopharmaceutical throughout the peritoneal cavity. The largest peritoneal pockets are in the right upper quadrant and left lower quadrant. Over time, radiopharmaceutical starts to accumulate in the left hemithorax. Findings are compatible with a trans diaphragmatic connection between the peritoneal cavity and the left pleural space. IMPRESSION: Study is positive for connection between the peritoneal space and left pleural space. 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 Electronically Signed   By: Markus Daft M.D.   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 Plan for nuclear medicine ascites - hydrothorax examination. Plan for an ultrasound-guided paracentesis and injection of radiopharmaceutical into the ascites. Left thoracentesis was performed prior to this procedure. EXAM: ULTRASOUND GUIDED PARACENTESIS INJECTION OF RADIOPHARMACEUTICAL INTO ASCITES MEDICATIONS: None.  COMPLICATIONS: None immediate.  PROCEDURE: Informed written consent was obtained from the patient after a discussion of the risks, benefits and alternatives to treatment. A timeout was performed prior to the initiation of the procedure. Initial ultrasound scanning demonstrates a small amount of ascites within the right upper abdominal quadrant. The right upper abdomen was prepped and draped in the usual sterile fashion. 1% lidocaine with was used for local anesthesia. Following this, a 6 Fr Safe-T-Centesis catheter was introduced. An ultrasound image was saved for documentation purposes. The paracentesis was performed. 60 mL of opaque yellow fluid was removed. The radiopharmaceutical was injected through the Safe-T-Centesis catheter. Catheter was flushed with sterile saline. The catheter was removed and a dressing was applied. The patient tolerated the procedure well without immediate post procedural complication. FINDINGS: Small amount of fluid around the liver. Small amount of fluid in the left lower quadrant. 60 mL of fluid was removed from the right upper quadrant. The radiopharmaceutical was injected into the right upper quadrant. IMPRESSION: Successful ultrasound-guided paracentesis yielding 60 mL of ascites. Fluid was sent for labs. Radiopharmaceutical was injected through the paracentesis catheter for the nuclear medicine examination. Electronically Signed   By: Markus Daft M.D.   On: 03/19/2018 17:06   Ct Abdomen W Wo Contrast  Result Date: 03/06/2018 CLINICAL DATA:  Pancreatic mass seen on earlier [place]. Pancreatic mass workup . Cirrhosis. Epigastric and LUQ pain. 29m of Omni 300 used. Pt could only tolerate laying on her left side for scan.^1056mOMNIPAQUE IOHEXOL 300 MG/ML SOLNPancreatic mass EXAM: CT ABDOMEN WITHOUT AND WITH CONTRAST TECHNIQUE: Multidetector CT imaging of the abdomen was performed following the standard protocol before and following the bolus administration of intravenous contrast. CONTRAST:  10051mMNIPAQUE IOHEXOL  300 MG/ML  SOLN COMPARISON:  Ultrasound 03/04/2010 FINDINGS: Lower chest:  Large LEFT pleural effusion with passive atelectasis. Hepatobiliary: Nodular liver. Caudate lobe is enlarged. Portal veins are patent. Multiple gallstones within lumen gallbladder. No enhancing hepatic lesion. No intrahepatic or extrahepatic biliary duct dilatation. Pancreas: No abnormality of the pancreatic head body or tail. No duct dilatation. Periampullary duodenum diverticulum noted. Lymph node position inferior to the pancreas adjacent to the portal vein measures 16 mm (image 49/4) Spleen: Spleen borderline enlarged. Adrenals/urinary tract: LEFT kidney is normal. RIGHT kidney normal. No adrenal abnormality Stomach/Bowel: Stomach and limited of the small bowel is unremarkable Vascular/Lymphatic: Abdominal aortic normal caliber. No retroperitoneal periportal lymphadenopathy. Musculoskeletal: No aggressive osseous lesion IMPRESSION: 1. No pancreatic lesion identified. 2. Peripancreatic lymph node is mildly enlarged. 3. Small periampullary duodenum diverticulum present. 4. Morphologic changes in liver consistent with cirrhosis. No biliary duct dilatation of the intrahepatic or extrahepatic bile ducts. 5. No enhancing hepatic lesion. 6. Cholelithiasis. 7. Moderate volume of intraperitoneal free fluid. 8. Large LEFT pleural effusion Electronically Signed   By: SteSuzy BouchardD.   On: 03/06/2018 16:31   Dg Bone Density (dxa)  Result Date: 03/11/2018 EXAM: DUAL X-RAY ABSORPTIOMETRY (DXA) FOR BONE MINERAL DENSITY IMPRESSION: Dear Dr, RosLutricia Feilour patient LesNakyla Braccompleted a FRAX assessment on 03/11/2018 using the LunEllenboronalysis version: 14.10) manufactured by [employer]he following summarizes the results of our evaluation. PATIENT BIOGRAPHICAL: Name: LapDhani, Imeltient ID: [id number] Date: [birth year]ight:    62.5 in. Gender:     Female    [age]       Weight:    158.4 lbs.  Ethnicity:  White  Exam Date: 03/11/2018 FRAX* RESULTS:  (version: 3.5) 10-year Probability of Fracture1 Major Osteoporotic Fracture2 Hip Fracture 8.1% 0.7% Population: [place] (Caucasian) Risk Factors: None Based on Femur (Left) Neck BMD 1 -The 10-year probability of fracture may be lower than reported if the patient has received treatment. 2 -Major Osteoporotic Fracture: Clinical Spine, Forearm, Hip or Shoulder *FRAX is a Materials engineer of the [employer] for Metabolic Bone Disease, a [place] ([facility]) [facility]. ASSESSMENT: The probability of a major osteoporotic fracture is 8.1% within the next ten years. The probability of a hip fracture is 0.7% within the next ten years. . Technologist: SCE PATIENT BIOGRAPHICAL: Name: Landree, Fernholz Patient ID: [id number] Birth Date: October 23, 1955 Height: 62.5 in. Gender: Female Exam Date: 03/11/2018 Weight: 158.4 lbs. Indications: Asthma, Caucasian, Cirrhosis, Early Menopause, Postmenopausal, Previous Smoker Fractures: Treatments: Gabapentin, primidone ASSESSMENT: The BMD measured at Femur Neck Left is 0.833 g/cm2 with a T-score of -1.5. This patient is considered osteopenic according to [facility]) criteria. The quality of the scan is good. Site Region Measured Measured WHO Young Adult BMD Date       Age      Classification T-score AP Spine L1-L4 03/11/2018 63.0 Osteopenia -1.5 1.007 g/cm2 DualFemur Neck Left 03/11/2018 61.0 Osteopenia -1.5 0.833 g/cm2 World Health Organization [facility]) criteria for post-menopausal, Caucasian Women: Normal:       T-score at or above -1 SD Osteopenia:   T-score between -1 and -2.5 SD Osteoporosis: T-score at or below -2.5 SD RECOMMENDATIONS: 1. All patients should optimize calcium and vitamin D intake. 2. Consider FDA-approved medical therapies in postmenopausal women and men aged 44 years and older, based on the following: a. A hip or  vertebral(clinical or morphometric) fracture b. T-score < -2.5 at the femoral neck or spine after appropriate evaluation to exclude secondary causes c. Low bone mass (T-score between -1.0 and -2.5 at the femoral neck or spine) and a 10-year probability of a hip fracture > 3% or a 10-year probability of a major osteoporosis-related fracture > 20% based on the [place]-adapted WHO algorithm d. Clinician judgment and/or patient preferences may indicate treatment for people with 10-year fracture probabilities above or below these levels FOLLOW-UP: People with diagnosed cases of osteoporosis or at high risk for fracture should have regular bone mineral density tests. For patients eligible for Medicare, routine testing is allowed once every 2 years. The testing frequency can be increased to one year for patients who have rapidly progressing disease, those who are receiving or discontinuing medical therapy to restore bone mass, or have additional risk factors. I have reviewed this report, and agree with the above findings. [facility] Electronically Signed   By: Lowella Grip III M.D.   On: 03/11/2018 11:23   Dg Chest Port 1 View  Result Date: 03/28/2018 CLINICAL DATA:  Recurrent effusion EXAM: PORTABLE CHEST 1 VIEW COMPARISON:  03/27/2018, 03/26/2018, 03/24/2018 FINDINGS: Interval diffuse hazy opacity left thorax likely due to layering effusion, moderate in size. This is increased compared to prior. Underlying edema not excluded. Stable cardiomediastinal silhouette. Left basilar airspace disease. IMPRESSION: Increased moderate left pleural effusion with development of diffuse hazy opacity in the left thorax which may reflect layering fluid and or edema. Increased airspace disease at the left base. Electronically Signed   By: Donavan Foil M.D.   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  EXAM: PORTABLE CHEST 1 VIEW COMPARISON:  Chest radiograph-03/26/2018 FINDINGS: Grossly unchanged cardiac silhouette and mediastinal contours. Interval reduction in persistent moderate size left-sided effusion post thoracentesis. No pneumothorax. Improved aeration of the left mid and lower lung with persistent left basilar heterogeneous/consolidative opacities. No new focal airspace opacities. No definite right-sided pleural effusion. No evidence of edema. No acute osseous abnormalities. IMPRESSION: 1. Interval reduction in persistent moderate size left-sided effusion post thoracentesis. No pneumothorax. 2. Improved aeration of the left lung base with persistent left basilar atelectasis. Electronically Signed   By: Sandi Mariscal M.D.   On: 03/27/2018 13:34   Dg Chest Port 1 View  Result Date: 03/24/2018 CLINICAL DATA:  Post left thoracentesis EXAM: PORTABLE CHEST 1 VIEW COMPARISON:  03/21/2018 FINDINGS: Moderate to large left pleural effusion minimally changed since prior study. No pneumothorax. Left basilar opacity, likely atelectasis. No confluent opacity on the right. Heart is upper limits normal in size. IMPRESSION: Moderate to large-sized left pleural effusion minimally changed. No pneumothorax. Continued left base atelectasis or infiltrate. Electronically Signed   By: Rolm Baptise M.D.   On: 03/24/2018 12:46   Dg Chest Port 1 View  Result Date: 03/19/2018 CLINICAL DATA:  Status post thoracentesis. EXAM: PORTABLE CHEST 1 VIEW COMPARISON:  03/14/2018 FINDINGS: 1702 hours. Moderate to large left pleural effusion noted without evidence for pneumothorax. Left base collapse/consolidation. The cardio pericardial silhouette is enlarged. The visualized bony structures of the thorax are intact. Telemetry leads overlie the chest. IMPRESSION: 1. No evidence for pneumothorax status post thoracentesis. 2. Moderate to large left pleural effusion. Electronically Signed   By: Misty Stanley M.D.   On: 03/19/2018 18:56    Dg Chest Portable 1 View  Result Date: 03/14/2018 CLINICAL DATA:  Shortness of breath. EXAM: PORTABLE CHEST 1 VIEW COMPARISON:  Chest x-ray 03/06/2018. FINDINGS: Mediastinum and hilar structures normal. Large left pleural effusion, increased in size from prior exam. Underlying left lung atelectasis/infiltrate can not be excluded. No pneumothorax. Heart size most likely stable. IMPRESSION: Large left pleural effusion, increased from prior exam. Electronically Signed   By: Marcello Moores  Register   On: 03/14/2018 13:19   Dg Chest Port 1 View  Result Date: 03/06/2018 CLINICAL DATA:  Post large volume left-sided thoracentesis. EXAM: PORTABLE CHEST 1 VIEW COMPARISON:  Chest radiograph-02/20/2018; CT abdomen pelvis-earlier same day FINDINGS: Interval reduction in persistent moderate size left-sided effusion post large volume thoracentesis. No pneumothorax. Improved aeration of left lung base with persistent left basilar heterogeneous/consolidative opacities. No definite evidence of right-sided pleural effusion. No new focal airspace opacities. Unchanged cardiac silhouette and mediastinal contours. No acute osseous abnormalities. IMPRESSION: Interval reduction in persistent moderate sized left-sided effusion post thoracentesis. No pneumothorax. Electronically Signed   By: Sandi Mariscal M.D.   On: 03/06/2018 16:02   Mm 3d Screen Breast Bilateral  Result Date: 03/12/2018 CLINICAL DATA:  Screening. EXAM: DIGITAL SCREENING BILATERAL MAMMOGRAM WITH TOMO AND CAD COMPARISON:  Previous exam(s). ACR Breast Density Category b: There are scattered areas of fibroglandular density. FINDINGS: There are no findings suspicious for malignancy. Images were processed with CAD. IMPRESSION: No mammographic evidence of malignancy. A result letter of this screening mammogram will be mailed directly to the patient. RECOMMENDATION: Screening mammogram in one year. (Code:SM-B-01Y) BI-RADS CATEGORY  1: Negative. Electronically Signed   By:  Dorise Bullion III M.D   On: 03/12/2018 14:44   Nm Hepato Biliary Leak  Result Date: 03/17/2018 CLINICAL DATA:  Cholelithiasis. Nausea and vomiting. Hepatic cirrhosis. EXAM: NUCLEAR MEDICINE HEPATOBILIARY IMAGING TECHNIQUE: Sequential  images of the abdomen were obtained out to 60 minutes following intravenous administration of radiopharmaceutical. RADIOPHARMACEUTICALS:  5.1 mCi Tc-76m Choletec IV COMPARISON:  CT on 03/14/2018 FINDINGS: Delayed uptake and biliary excretion of activity by the liver is seen, consistent with known hepatic cirrhosis. Gallbladder activity is visualized, consistent with patency of cystic duct. Biliary activity passes into small bowel, consistent with patent common bile duct. IMPRESSION: No evidence of acute cholecystitis or biliary ductal dilatation. Hepatocellular dysfunction, consistent with hepatic cirrhosis. Electronically Signed   By: JEarle GellM.D.   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 Request for therapeutic thoracentesis today in IR. EXAM: ULTRASOUND GUIDED LEFT THORACENTESIS MEDICATIONS: 10 mL 1% lidocaine. COMPLICATIONS: None immediate. PROCEDURE: An ultrasound guided thoracentesis was thoroughly discussed with the patient and questions answered. The benefits, risks, alternatives and complications were also discussed. The patient understands and wishes to proceed with the procedure. Written consent was obtained. Ultrasound was performed to localize and mark an adequate pocket of fluid in the left chest. The area was then prepped and draped in the normal sterile fashion. 1% Lidocaine was used for local anesthesia. Under ultrasound guidance a 6 Fr Safe-T-Centesis catheter was introduced. Thoracentesis was performed. The catheter was removed and a dressing applied. FINDINGS: A total of approximately 2.1 L of  chylous fluid was removed. IMPRESSION: Successful ultrasound guided left thoracentesis yielding 2.1 L of pleural fluid. Read by SCandiss Norse PA-C Electronically Signed   By: MJerilynn Mages  Shick M.D.   On: 03/30/2018 12:54   [place]oracentesis Asp Pleural Space W/img Guide  Result Date: 03/27/2018 INDICATION: History of left-sided hepatic hydrothorax now with recurrent symptomatic left-sided pleural effusion. Please perform ultrasound-guided thoracentesis for therapeutic purposes. EXAM: [place]ORACENTESIS ASP PLEURAL SPACE W/IMG GUIDE COMPARISON:  Chest radiograph-03/26/2018 MEDICATIONS: None. COMPLICATIONS: None immediate. TECHNIQUE: Informed written consent was obtained from the patient after a discussion of the risks, benefits and alternatives to treatment. A timeout was performed prior to the initiation of the procedure. Initial ultrasound scanning demonstrates a large minimally complex though predominantly anechoic left-sided pleural effusion. The lower chest was prepped and draped in the usual sterile fashion. 1% lidocaine was used for local anesthesia. An ultrasound image was saved for documentation purposes. An 8 Fr Safe-T-Centesis catheter was introduced. The thoracentesis was performed. The catheter was removed and a dressing was applied. The patient tolerated the procedure well without immediate post procedural complication. The patient was escorted to have an upright chest radiograph. FINDINGS: A total of approximately 2.2 liters of serous fluid was removed. IMPRESSION: Successful ultrasound-guided left sided thoracentesis yielding 2.2 liters of pleural fluid. Electronically Signed   By: JSandi MariscalM.D.   On: 03/27/2018 13:37   [place]oracentesis Asp Pleural Space W/img Guide  Result Date: 03/24/2018 INDICATION: Patient with history of hepatic hydrothorax, cirrhosis and recurrent left pleural effusion who presents today originally for paracentesis in order to obtain flow cytometry to r/o malignancy prior to  possible TIPS procedure. Limited abdominal [place] scant peritoneal fluid not amenable to percutaneous drainage. Diagnostic and therapeutic thoracentesis performed in order to obtain requested labs in lieu of paracentesis given patient's history of hepatic hydrothorax. EXAM: ULTRASOUND GUIDED LEFT THORACENTESIS MEDICATIONS: 20 mL 1% lidocaine. COMPLICATIONS: None immediate. PROCEDURE: An ultrasound guided thoracentesis was thoroughly discussed with the patient and questions answered. The benefits, risks, alternatives and complications were also discussed. The patient  understands and wishes to proceed with the procedure. Written consent was obtained. Ultrasound was performed to localize and mark an adequate pocket of fluid in the left chest. The area was then prepped and draped in the normal sterile fashion. 1% Lidocaine was used for local anesthesia. Under ultrasound guidance a 6 Fr Safe-T-Centesis catheter was introduced. Thoracentesis was performed. The catheter was removed and a dressing applied. FINDINGS: A total of approximately 1.1 L of pink milky fluid was removed. Samples were sent to the laboratory as requested by the clinical team. IMPRESSION: Successful ultrasound guided left thoracentesis yielding 1.1 L of pleural fluid. Read by Candiss Norse, PA-C Electronically Signed   By: Lucrezia Europe M.D.   On: 03/24/2018 12:49   US Thoracentesis Asp Pleural Space W/img Guide  Result Date: 03/19/2018 INDICATION: 62 year old with cirrhosis and recurrent left hydrothorax. Patient is being evaluated for a connection between the peritoneal and left pleural space with a nuclear medicine examination. Patient has a large amount of left pleural fluid on ultrasound examination. Plan to remove some of the left pleural fluid prior to the nuclear medicine examination. EXAM: ULTRASOUND GUIDED LEFT THORACENTESIS MEDICATIONS: None. COMPLICATIONS: None immediate. PROCEDURE: An ultrasound guided thoracentesis was thoroughly  discussed with the patient and questions answered. The benefits, risks, alternatives and complications were also discussed. The patient understands and wishes to proceed with the procedure. Written consent was obtained. Ultrasound was performed to localize and mark an adequate pocket of fluid in the left chest. The area was then prepped and draped in the normal sterile fashion. 1% Lidocaine was used for local anesthesia. Under ultrasound guidance a 6 Fr Safe-T-Centesis catheter was introduced. Thoracentesis was performed. The catheter was removed and a dressing applied. FINDINGS: A total of approximately 1.1 L of opaque yellow fluid was removed. Samples were sent to the laboratory as requested by the clinical team. IMPRESSION: Successful ultrasound guided left thoracentesis yielding 1.1 L of pleural fluid. Electronically Signed   By: Markus Daft M.D.   On: 03/19/2018 17:11   US Thoracentesis Asp Pleural Space W/img Guide  Result Date: 03/14/2018 CLINICAL DATA:  Recurrent left pleural effusion. EXAM: ULTRASOUND GUIDED LEFT THORACENTESIS COMPARISON:  None. PROCEDURE: An ultrasound guided thoracentesis was thoroughly discussed with the patient and questions answered. The benefits, risks, alternatives and complications were also discussed. The patient understands and wishes to proceed with the procedure. Written consent was obtained. Ultrasound was performed to localize and mark an adequate pocket of fluid in the left chest. The area was then prepped and draped in the normal sterile fashion. 1% Lidocaine was used for local anesthesia. Under ultrasound guidance a 6 French Safe-T-Centesis catheter was introduced. Thoracentesis was performed. The catheter was removed and a dressing applied. COMPLICATIONS: None FINDINGS: A total of approximately 2.3 L of cloudy, yellowish-brown fluid was removed. A fluid sample was sent for laboratory analysis. IMPRESSION: Successful ultrasound guided left thoracentesis yielding 2.3 L of  pleural fluid. Electronically Signed   By: Aletta Edouard M.D.   On: 03/14/2018 16:13   US Thoracentesis Asp Pleural Space W/img Guide  Result Date: 03/06/2018 INDICATION: History of cirrhosis now with recurrent symptomatic left-sided pleural effusion worrisome for hepatic hydrothorax. Please from ultrasound-guided thoracentesis for therapeutic purposes. EXAM: US THORACENTESIS ASP PLEURAL SPACE W/IMG GUIDE COMPARISON:  Ultrasound-guided thoracentesis-03/22/2018 (yielding 2.2 L of pleural fluid); CT abdomen pelvis-earlier same day MEDICATIONS: None. COMPLICATIONS: None immediate. TECHNIQUE: Informed written consent was obtained from the patient after a discussion of the risks, benefits and alternatives to treatment.  A timeout was performed prior to the initiation of the procedure. Initial ultrasound scanning demonstrates a large anechoic left-sided pleural effusion. The lower chest was prepped and draped in the usual sterile fashion. 1% lidocaine was used for local anesthesia. An ultrasound image was saved for documentation purposes. An 8 Fr Safe-T-Centesis catheter was introduced. The thoracentesis was performed. Despite residual fluid within the left pleural space, the patient wished to terminate the procedure given left-sided chest pain. As such, the catheter was removed and a dressing was applied. The patient tolerated the procedure well without immediate post procedural complication. The patient was escorted to have an upright chest radiograph. FINDINGS: A total of approximately 1.9 liters of amber colored serous fluid was removed. IMPRESSION: Successful ultrasound-guided left sided thoracentesis yielding 1.9 liters of pleural fluid. Electronically Signed   By: Sandi Mariscal M.D.   On: 03/06/2018 16:04     LOS: 3 days   Oren Binet, MD  [facility]  If 7PM-7AM, please contact night-coverage  Please page via [email or web address]  Go to [email or web address] and use [place]'s universal password to  access. If you do not have the password, please contact the hospital operator.  Locate the [facility] provider you are looking for under [facility] and page to a number that you can be directly reached. If you still have difficulty reaching the provider, please page the [facility] (Director on Call) for the Hospitalists listed on amion for assistance.  03/31/2018, 11:47 AM

## 2018-04-01 DIAGNOSIS — K7210 Chronic hepatic failure without coma: Secondary | ICD-10-CM

## 2018-04-01 DIAGNOSIS — Z515 Encounter for palliative care: Secondary | ICD-10-CM

## 2018-04-01 DIAGNOSIS — Z7189 Other specified counseling: Secondary | ICD-10-CM

## 2018-04-01 LAB — COMPREHENSIVE METABOLIC PANEL
ALT: 18 U/L (ref 0–44)
AST: 28 U/L (ref 15–41)
Albumin: 5 g/dL (ref 3.5–5.0)
Alkaline Phosphatase: 34 U/L — ABNORMAL LOW (ref 38–126)
Anion gap: 7 (ref 5–15)
BUN: 33 mg/dL — ABNORMAL HIGH (ref 8–23)
CHLORIDE: 107 mmol/L (ref 98–111)
CO2: 24 mmol/L (ref 22–32)
Calcium: 9.8 mg/dL (ref 8.9–10.3)
Creatinine, Ser: 1.96 mg/dL — ABNORMAL HIGH (ref 0.44–1.00)
GFR calc Af Amer: 31 mL/min — ABNORMAL LOW (ref >60)
GFR calc non Af Amer: 27 mL/min — ABNORMAL LOW (ref >60)
Glucose, Bld: 105 mg/dL — ABNORMAL HIGH (ref 70–99)
Potassium: 4.6 mmol/L (ref 3.5–5.1)
Sodium: 138 mmol/L (ref 135–145)
Total Bilirubin: 2 mg/dL — ABNORMAL HIGH (ref 0.3–1.2)
Total Protein: 6.3 g/dL — ABNORMAL LOW (ref 6.5–8.1)

## 2018-04-01 LAB — CBC
HCT: 24.1 % — ABNORMAL LOW (ref 36.0–46.0)
Hemoglobin: 7.9 g/dL — ABNORMAL LOW (ref 12.0–15.0)
MCH: 33.2 pg (ref 26.0–34.0)
MCHC: 32.8 g/dL (ref 30.0–36.0)
MCV: 101.3 fL — ABNORMAL HIGH (ref 80.0–100.0)
Platelets: 48 10*3/uL — ABNORMAL LOW (ref 150–400)
RBC: 2.38 MIL/uL — ABNORMAL LOW (ref 3.87–5.11)
RDW: 15.9 % — ABNORMAL HIGH (ref 11.5–15.5)
WBC: 1.9 10*3/uL — ABNORMAL LOW (ref 4.0–10.5)
nRBC: 0 % (ref 0.0–0.2)

## 2018-04-01 LAB — TYPE AND SCREEN
ABO/RH(D): O NEG
Antibody Screen: NEGATIVE
Unit division: 0

## 2018-04-01 LAB — BPAM RBC
Blood Product Expiration Date: 202003202359
ISSUE DATE / TIME: 202003090636
Unit Type and Rh: 9500

## 2018-04-01 MED ORDER — DOCUSATE SODIUM 50 MG/5ML PO LIQD
100.0000 mg | Freq: Two times a day (BID) | ORAL | Status: DC
  Administered 2018-04-01 – 2018-04-08 (×8): 100 mg via ORAL
  Filled 2018-04-01 (×14): qty 10

## 2018-04-01 MED ORDER — FAMOTIDINE 20 MG PO TABS: 20.0000 mg | ORAL_TABLET | Freq: Every day | ORAL | Status: DC

## 2018-04-01 NOTE — Progress Notes (Signed)
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  She is followed by [facility] and is considered for TIPS procedure.  She was evaluated by nephrology and was found to have acute on chronic kidney disease.  In January her baseline serum creatinine appears to be 1.6 mg/dL she was placed on Midrin octreotide and IV albumin.  Her prognosis was thought to be poor.  She is resting comfortably this morning with no complaints.  She is evaluated by interventional radiology 03/31/2018 for the TIPS procedure  Blood pressure 130/54 pulse 67 temperature 98.2 O2 sats 97% 2 L nasal cannula  Urine output none recorded for 03/31/2018  She is receiving blood transfusion 03/31/2018  Sodium 138 potassium 4.6 chloride 107 CO2 24 BUN 33 creatinine 1.96 glucose 105 calcium 9.8 albumin 5.0 AST 20 ALT 18 bilirubin 2.0 WBC 1.9 hemoglobin 7.9 platelets 48   Medications Midrin 10 mg 3 times daily, BuSpar 10 mg 3 times daily, Celexa 20 mg daily, octreotide 150 mg subcu every 8 hours, famotidine 40 mg daily, albumin 25% 50 g every 12 hours, gabapentin 200 mg nightly, primidone 50 mg twice daily, Tums 400 mg twice daily  Objective:  Vital signs in last 24 hours:  Temp:  [97.9 F (36.6 C)-98.5 F (36.9 C)] 98.2 F (36.8 C) (03/10 0541) Pulse Rate:  [58-67] 67 (03/10 0541) Resp:  [15-18] 16 (03/10 0541) BP: (120-134)/(44-54) 130/54 (03/10 0541) SpO2:  [97 %-100 %] 97 % (03/10 0541) Weight:  [68.2 kg] 68.2 kg (03/10 0604)  Weight change: -0.066 kg Filed Weights   03/30/18 0645 03/31/18 0450 04/01/18 0604  Weight: 72.7 kg 68.3 kg 68.2 kg    Intake/Output: I/O last 3 completed shifts: In: 597 [P.O.:60; Blood:330; IV Piggyback:207] Out: 202 [Urine:202]   Intake/Output this shift:  No intake/output data recorded. Chronically ill  requiring oxygen CVS- RRR no murmurs rubs or gallops RS- CTA no wheezes or rales ABD- BS present soft non-distended EXT- no edema   Basic Metabolic Panel: Recent Labs  Lab 03/26/18 2000 03/28/18 2130 03/30/18 0550 03/31/18 0208 04/01/18 0322  NA 136 133* 135 134* 138  K 5.4* 5.1 4.3 4.9 4.6  CL 105 103 107 107 107  CO2 23 23 24 22 24   GLUCOSE 97 90 79 132* 105*  BUN 39* 45* 44* 38* 33*  CREATININE 2.21* 2.32* 2.06* 1.94* 1.96*  CALCIUM 8.9 8.8* 8.8* 9.0 9.8    Liver Function Tests: Recent Labs  Lab 03/26/18 2000 03/28/18 2130 03/30/18 0550 04/01/18 0322  AST 43* 43* 30 28  ALT 32 31 24 18   ALKPHOS 82 87 61 34*  BILITOT 1.1 1.2 1.3* 2.0*  PROT 6.0* 5.9* 5.5* 6.3*  ALBUMIN 2.6* 2.3* 3.0* 5.0   No results for input(s): LIPASE, AMYLASE in the last 168 hours. Recent Labs  Lab 03/26/18 2000  AMMONIA 28    CBC: Recent Labs  Lab 03/26/18 2000 03/28/18 2130 03/30/18 0550 03/31/18 0208 04/01/18 0322  WBC 4.5 4.9 2.4* 1.9* 1.9*  HGB 9.1* 9.1* 7.4* 6.9* 7.9*  HCT 28.7* 27.3* 22.3* 21.2* 24.1*  MCV 106.7* 101.5* 102.3* 101.9* 101.3*  PLT 86* 81* 54* 49* 48*    Cardiac Enzymes: No results for input(s): CKTOTAL, CKMB, CKMBINDEX, TROPONINI in the last 168 hours.  BNP: Invalid input(s): POCBNP  CBG: No results for input(s): GLUCAP in the last 168 hours.  Microbiology: Results for orders placed or performed during the hospital encounter of 03/14/18  Body fluid culture     Status: None   Collection Time: 03/14/18  3:50 PM  Result Value Ref Range Status   Specimen Description   Final    PLEURAL Performed at [facility], [address] [place], [place] [address]    Special Requests   Final    NONE Performed at [facility], [place], [place] [address]    Gram Stain NO WBC SEEN NO ORGANISMS SEEN   Final   Culture   Final    NO GROWTH 3 DAYS Performed at [facility], [place] [address] [place], [place]  [address]    Report Status 03/18/2018 FINAL  Final  Body fluid culture     Status: None   Collection Time: 03/19/18  3:50 PM  Result Value Ref Range Status   Specimen Description   Final    PLEURAL Performed at [facility], [address], [place], [place] [address]    Special Requests   Final    NONE Performed at [facility], [address] [place], [place] [address]    Gram Stain   Final    FEW WBC PRESENT, PREDOMINANTLY MONONUCLEAR NO ORGANISMS SEEN    Culture   Final    NO GROWTH 3 DAYS Performed at [facility], [place] [address] [place], [place] [address]    Report Status 03/23/2018 FINAL  Final  Body fluid culture     Status: None   Collection Time: 03/19/18  4:00 PM  Result Value Ref Range Status   Specimen Description   Final    PERITONEAL Performed at [facility], [address] [place], [place] [address]    Special Requests   Final    NONE Performed at [facility], [place] [place], [place] [address]    Gram Stain   Final    RARE WBC PRESENT, PREDOMINANTLY MONONUCLEAR NO ORGANISMS SEEN    Culture   Final    NO GROWTH 3 DAYS Performed at [facility], [place] [address] [place], [place] [address]    Report Status 03/22/2018 FINAL  Final  Acid Fast Smear (AFB)     Status: None (Preliminary result)   Collection Time: 03/24/18  1:22 PM  Result Value Ref Range Status   AFB Specimen Processing Concentration  Final   Acid Fast Smear Negative  Final    Comment: (NOTE) Performed At: [facility] [place], [place] [id number] Rush Farmer MD CB:[id number]    Source (AFB) PENDING  Incomplete    Coagulation Studies: No results for input(s): LABPROT, INR in the last 72 hours.  Urinalysis: Recent Labs    03/29/18 2058  COLORURINE YELLOW  LABSPEC 1.012  PHURINE 5.0  GLUCOSEU NEGATIVE  HGBUR LARGE*  BILIRUBINUR NEGATIVE  KETONESUR NEGATIVE  PROTEINUR NEGATIVE  NITRITE  NEGATIVE  LEUKOCYTESUR TRACE*      Imaging: Dg Chest 1 View  Result Date: 03/30/2018 CLINICAL DATA:  Status post thoracentesis. EXAM: CHEST  1 VIEW COMPARISON:  03/27/2017 FINDINGS: Midline trachea. Normal heart size. Small right pleural effusion is suspected. The left-sided effusion has decreased. No pneumothorax. Improved aeration with decreased left-sided atelectasis. Mild subsegmental atelectasis remains at both lung bases. IMPRESSION: Decreased left pleural effusion, without pneumothorax. Improved aeration with mild bibasilar atelectasis remaining. Electronically Signed   By: Abigail Miyamoto M.D.   On:  03/30/2018 13:47   US Renal  Result Date: 03/30/2018 CLINICAL DATA:  Renal failure.  Cirrhosis. EXAM: RENAL / URINARY TRACT ULTRASOUND COMPLETE COMPARISON:  CT scan March 20, 2018 FINDINGS: Right Kidney: Renal measurements: 9 x 4.6 x 5.3 cm = volume: 114 mL. Contain in upper pole levin mm complex cysts. Contains a 7 mm lower pole cyst. Increased cortical echogenicity. Left Kidney: Renal measurements: 7.9 x 3.8 x 4.7 cm = volume: 73 mL. Increased cortical echogenicity. Bladder: Appears normal for degree of bladder distention. IMPRESSION: 1. Two small cysts in the right kidney, 1 of which is mildly complex. Medical renal disease with increased cortical echogenicity. No hydronephrosis. 2. Nodular liver consistent with cirrhosis.  Ascites. 3. The spleen is prominent measuring up to 13.2 cm. However, this volume of 357 cc does not meet the criteria for splenomegaly. Electronically Signed   By: Dorise Bullion III M.D   On: 03/30/2018 16:13   US Thoracentesis Asp Pleural Space W/img Guide  Result Date: 03/30/2018 INDICATION: Patient with history of hepatic hydrothorax and recurrent symptomatic left pleural effusion. Request for therapeutic thoracentesis today in IR. EXAM: ULTRASOUND GUIDED LEFT THORACENTESIS MEDICATIONS: 10 mL 1% lidocaine. COMPLICATIONS: None immediate. PROCEDURE: An ultrasound guided  thoracentesis was thoroughly discussed with the patient and questions answered. The benefits, risks, alternatives and complications were also discussed. The patient understands and wishes to proceed with the procedure. Written consent was obtained. Ultrasound was performed to localize and mark an adequate pocket of fluid in the left chest. The area was then prepped and draped in the normal sterile fashion. 1% Lidocaine was used for local anesthesia. Under ultrasound guidance a 6 Fr Safe-T-Centesis catheter was introduced. Thoracentesis was performed. The catheter was removed and a dressing applied. FINDINGS: A total of approximately 2.1 L of chylous fluid was removed. IMPRESSION: Successful ultrasound guided left thoracentesis yielding 2.1 L of pleural fluid. Read by Candiss Norse, PA-C Electronically Signed   By: Jerilynn Mages.  Shick M.D.   On: 03/30/2018 12:54     Medications:    . sodium chloride   Intravenous Once  . busPIRone  10 mg Oral TID  . calcium carbonate  2 tablet Oral BID  . citalopram  20 mg Oral Daily  . docusate sodium  100 mg Oral BID  . famotidine  20 mg Oral QHS  . gabapentin  200 mg Oral QHS  . midodrine  10 mg Oral TID WC  . octreotide  150 mcg Subcutaneous Q8H  . primidone  50 mg Oral BID   albuterol, ondansetron **OR** ondansetron (ZOFRAN) IV  Assessment/ Plan:   Acute kidney injury on chronic kidney disease with possible consideration of hepatorenal syndrome she is currently being treated with Midrin 10 mg 3 times daily, octreotide 150 mcg every 8 hours and albumin.  Urine sodium less than 10.  Urinalysis did reveal 11-20 RBCs.  No protein renal ultrasound revealed 2 small cysts in the right kidney no hydronephrosis and nodular cirrhotic liver and a prominent spleen.  Positive for ascites .  Her creatinine appears to have stooled about 1.9.  We will continue to follow prognosis is poor.  Hypertension volume appears adequately controlled does not appear to be any ascites at  this particular time.  Recurrent left-sided pleural effusion with hepatic hydrothorax required repeated thoracenteses TIPS procedure planned  Decompensated liver cirrhosis possible hepatorenal syndrome  Pancytopenia noted pharmacy review has indicated that primidone has been associated with cytopenias will discontinue primidone at this time continue to follow CBC.  Palliative care reconfirmed DNR not a dialysis candidate continue to follow   LOS: [place] @TODAY @9 :28 AM

## 2018-04-01 NOTE — Progress Notes (Signed)
PROGRESS NOTE        PATIENT DETAILS Name: Kelly Mclean Age: 63 y.o. Sex: female Date of Birth: [birth year] Admit Date: 03/28/2018 Admitting Physician Etta Quill, DO [id number], Lemmie Evens, MD  Brief Narrative: Patient is a 63 y.o. 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  See below for further details  Subjective: Lying comfortably in bed-no chest pain or shortness of breath  Assessment/Plan: Recurrent left-sided pleural effusion secondary to hepatic hydrothorax: Required repeated thoracocentesis x 6 in the past 2 weeks.  Thought to have hepatic hydrothorax-IR following for TIPS procedure-IR following for possible TIPS procedure-awaiting further improvement in renal function before TIPS can be completed.  Pleural fluid cytology x3- for malignancy.  Decompensated liver cirrhosis: Secondary to NASH.  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  AKI on CKD stage III: Suspected to be hepatorenal syndrome-creatinine improved since admission but seems to have plateaued for the past few days-nephrology following-continue with octreotide, midodrine and IV albumin.  Continue to avoid nephrotoxic agents.  Nephrology following.  Anemia: Probably has anemia secondary to liver cirrhosis/CKD-worsened due to acute illness.  No evidence of overt blood loss-denies melena or hematochezia.  Has been transfused 1 unit of PRBC-hemoglobin up to 7.9 this morning.  Follow periodically.   Thrombocytopenia: Suspect related to liver cirrhosis/hypersplenism.  Follow.  Deconditioning: Await PT/OT evaluation-May require SNF on discharge.  Depression/anxiety: Appears stable-continue BuSpar and Celexa.  Palliative care: Unfortunate 63 year old with Kelly Mclean related liver cirrhosis-now with hepatic  hydrothorax and worsening renal function.  Poor prognosis-seen by palliative care-DNR in place-but patient and family wants to continue full scope of treatment otherwise.  DVT Prophylaxis: SCD's  Code Status: DNR  Family Communication: None at bedside  Disposition Plan: Remain inpatient  Antimicrobial agents: Anti-infectives (From admission, onward)   None      Procedures: None  CONSULTS:  IR  Time spent: 25 minutes-Greater than 50% of this time was spent in counseling, explanation of diagnosis, planning of further management, and coordination of care.  MEDICATIONS: Scheduled Meds: . sodium chloride   Intravenous Once  . busPIRone  10 mg Oral TID  . calcium carbonate  2 tablet Oral BID  . citalopram  20 mg Oral Daily  . docusate sodium  100 mg Oral BID  . gabapentin  200 mg Oral QHS  . midodrine  10 mg Oral TID WC  . octreotide  150 mcg Subcutaneous Q8H   Continuous Infusions:  PRN Meds:.albuterol, ondansetron **OR** ondansetron (ZOFRAN) IV   PHYSICAL EXAM: Vital signs: Vitals:   03/31/18 2239 04/01/18 0541 04/01/18 0604 04/01/18 1442  BP: (!) 120/49 (!) 130/54  (!) 156/56  Pulse: (!) 59 67  62  Resp: 15 16  18   Temp: 98.5 F (36.9 C) 98.2 F (36.8 C)  98.3 F (36.8 C)  TempSrc: Oral Oral  Oral  SpO2: 100% 97%  100%  Weight:   68.2 kg   Height:       Filed Weights   03/30/18 0645 03/31/18 0450 04/01/18 0604  Weight: 72.7 kg 68.3 kg 68.2 kg   Body mass index is 27.5 kg/m.   General appearance:Awake, alert, not in any distress.  Eyes:no scleral icterus. HEENT: Atraumatic and Normocephalic Neck: supple, no JVD. Resp: Decreased air  entry in the left-no rales or rhonchi. CVS: S1 S2 regular, no murmurs.  GI: Bowel sounds present, Non tender and not distended with no gaurding, rigidity or rebound. Extremities: B/L Lower Ext shows no edema, both legs are warm to touch Neurology: Moving all 4 extremities. Musculoskeletal:No digital cyanosis Skin:No  Rash, warm and dry Wounds:N/A  I have personally reviewed following labs and imaging studies  LABORATORY DATA: CBC: Recent Labs  Lab 03/26/18 2000 03/28/18 2130 03/30/18 0550 03/31/18 0208 04/01/18 0322  WBC 4.5 4.9 2.4* 1.9* 1.9*  HGB 9.1* 9.1* 7.4* 6.9* 7.9*  HCT 28.7* 27.3* 22.3* 21.2* 24.1*  MCV 106.7* 101.5* 102.3* 101.9* 101.3*  PLT 86* 81* 54* 49* 48*    Basic Metabolic Panel: Recent Labs  Lab 03/26/18 2000 03/28/18 2130 03/30/18 0550 03/31/18 0208 04/01/18 0322  NA 136 133* 135 134* 138  K 5.4* 5.1 4.3 4.9 4.6  CL 105 103 107 107 107  CO2 23 23 24 22 24   GLUCOSE 97 90 79 132* 105*  BUN 39* 45* 44* 38* 33*  CREATININE 2.21* 2.32* 2.06* 1.94* 1.96*  CALCIUM 8.9 8.8* 8.8* 9.0 9.8    GFR: Estimated Creatinine Clearance: 27.3 mL/min (A) (by C-G formula based on SCr of 1.96 mg/dL (H)).  Liver Function Tests: Recent Labs  Lab 03/26/18 2000 03/28/18 2130 03/30/18 0550 04/01/18 0322  AST 43* 43* 30 28  ALT 32 31 24 18   ALKPHOS 82 87 61 34*  BILITOT 1.1 1.2 1.3* 2.0*  PROT 6.0* 5.9* 5.5* 6.3*  ALBUMIN 2.6* 2.3* 3.0* 5.0   No results for input(s): LIPASE, AMYLASE in the last 168 hours. Recent Labs  Lab 03/26/18 2000  AMMONIA 28    Coagulation Profile: Recent Labs  Lab 03/26/18 2000  INR 1.4*    Cardiac Enzymes: No results for input(s): CKTOTAL, CKMB, CKMBINDEX, TROPONINI in the last 168 hours.  BNP (last 3 results) No results for input(s): PROBNP in the last 8760 hours.  HbA1C: No results for input(s): HGBA1C in the last 72 hours.  CBG: No results for input(s): GLUCAP in the last 168 hours.  Lipid Profile: No results for input(s): CHOL, HDL, LDLCALC, TRIG, CHOLHDL, LDLDIRECT in the last 72 hours.  Thyroid Function Tests: No results for input(s): TSH, T4TOTAL, FREET4, T3FREE, THYROIDAB in the last 72 hours.  Anemia Panel: No results for input(s): VITAMINB12, FOLATE, FERRITIN, TIBC, IRON, RETICCTPCT in the last 72 hours.  Urine  analysis:    Component Value Date/Time   COLORURINE YELLOW 03/29/2018 2058   APPEARANCEUR CLEAR 03/29/2018 2058   LABSPEC 1.012 03/29/2018 2058   PHURINE 5.0 03/29/2018 2058   GLUCOSEU NEGATIVE 03/29/2018 2058   HGBUR LARGE (A) 03/29/2018 2058   BILIRUBINUR NEGATIVE 03/29/2018 2058   KETONESUR NEGATIVE 03/29/2018 2058   PROTEINUR NEGATIVE 03/29/2018 2058   NITRITE NEGATIVE 03/29/2018 2058   LEUKOCYTESUR TRACE (A) 03/29/2018 2058    Sepsis Labs: Lactic Acid, Venous No results found for: LATICACIDVEN  MICROBIOLOGY: Recent Results (from the past 240 hour(s))  Acid Fast Smear (AFB)     Status: None (Preliminary result)   Collection Time: 03/24/18  1:22 PM  Result Value Ref Range Status   AFB Specimen Processing Concentration  Final   Acid Fast Smear Negative  Final    Comment: (NOTE) Performed At: [facility] [place], [place] [id number] Rush Farmer MD [id number]    Source (AFB) PENDING  Incomplete    RADIOLOGY STUDIES/RESULTS: Ct Abdomen Pelvis Wo Contrast  Result Date: 03/20/2018  CLINICAL DATA:  History of cirrhosis of liver, dyspnea. Hepatic hydrothorax. Fluid with lymphocytes requiring rule out lymphoma. EXAM: CT ABDOMEN AND PELVIS WITHOUT CONTRAST TECHNIQUE: Multidetector CT imaging of the abdomen and pelvis was performed following the standard protocol without IV contrast. COMPARISON:  CT abdomen dated 03/06/2018 FINDINGS: Lower chest: Again noted is a large LEFT pleural effusion, incompletely imaged. Lingular consolidation is likely associated atelectasis. Hepatobiliary: Cirrhotic appearing liver. Gallstones are present within the nondistended gallbladder. No bile duct dilatation seen. Pancreas: Unremarkable. No pancreatic ductal dilatation or surrounding inflammatory changes. Spleen: Normal in size without focal abnormality. Adrenals/Urinary Tract: Kidneys are unremarkable without mass, stone or hydronephrosis. No perinephric fluid. No ureteral  or bladder calculi identified. Bladder is obscured by overlying ascites. Stomach/Bowel: No dilated large or small bowel loops. 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 Appendix is normal. Stomach is unremarkable. Vascular/Lymphatic: Aortic atherosclerosis. Enlarged lymph node adjacent to the pancreatic head, 1.6 cm, better seen on the recent contrast-enhanced CT of 03/06/2018. No other enlarged lymph nodes appreciated in the abdomen or pelvis. Reproductive: Uterus appears normal. Ascites obscures visualization of the bilateral adnexa. Other: Moderate amount of ascites within the abdomen and pelvis, largest component within the pelvis. No evidence of circumscribed fluid collection or abscess. No free intraperitoneal air. Musculoskeletal: No acute or suspicious osseous finding. Superficial soft tissues are unremarkable. IMPRESSION: 1. Cirrhotic liver. 2. Moderate amount of ascites within the abdomen and pelvis, largest component within the pelvis. 3. Enlarged lymph node adjacent to the pancreatic head, measuring 1.6 cm, better seen on the recent contrast-enhanced CT of 03/06/2016, favor reactive over neoplastic etiology given the absence of additional lymphadenopathy. 4. Large LEFT pleural effusion, incompletely imaged. 5. Cholelithiasis without evidence of acute cholecystitis. Aortic Atherosclerosis (ICD10-I70.0). Electronically Signed   By: Franki Cabot M.D.   On: 03/20/2018 14:20   Dg Chest 1 View  Result Date: 03/30/2018 CLINICAL DATA:  Status post thoracentesis. EXAM: CHEST  1 VIEW COMPARISON:  03/27/2017 FINDINGS: Midline trachea. Normal heart size. Small right pleural effusion is suspected. The left-sided effusion has decreased. No pneumothorax. Improved aeration with decreased left-sided atelectasis. Mild subsegmental atelectasis remains at both lung bases. IMPRESSION: Decreased left pleural effusion, without pneumothorax. Improved aeration with mild bibasilar atelectasis remaining. Electronically Signed    By: Abigail Miyamoto M.D.   On: 03/30/2018 13:47   Dg Chest 1 View  Result Date: 03/26/2018 CLINICAL DATA:  Shortness of breath. EXAM: CHEST  1 VIEW COMPARISON:  Multiple prior exams most recent radiograph 03/24/2018. Most recent CT 03/14/2018 FINDINGS: Known left pleural effusion has increased in size over the past 2 days, now large in size. Associated compressive atelectasis throughout the left lung. Mild rightward mediastinal shift versus rotation. Only a small portion of aerated left perihilar lung. Hypoventilatory right lung without acute finding. No visualized pneumothorax. IMPRESSION: Increased size of known left pleural effusion, now large in size. This causes mild mass effect in the mediastinum with rightward mediastinal shift. Electronically Signed   By: Keith Rake M.D.   On: 03/26/2018 19:37   Dg Chest 1 View  Result Date: 03/14/2018 CLINICAL DATA:  Status post thoracentesis EXAM: CHEST  1 VIEW COMPARISON:  March 14, 2018 12:56 p.m. FINDINGS: The heart size and mediastinal contours are within normal limits. There is small left pleural effusion significantly decreased compared prior exam. Consolidation left lung base is noted. There is no pneumothorax. The right lung is clear. The visualized skeletal structures are stable. IMPRESSION: There is small left pleural effusion, significantly decreased compared  prior exam. Consolidation of left lung base is identified. There is no pneumothorax. Electronically Signed   By: Abelardo Diesel M.D.   On: 03/14/2018 16:19   Ct Chest Wo Contrast  Result Date: 03/14/2018 CLINICAL DATA:  Pleural effusion and dyspnea since yesterday. EXAM: CT CHEST WITHOUT CONTRAST TECHNIQUE: Multidetector CT imaging of the chest was performed following the standard protocol without IV contrast. COMPARISON:  03/14/2018 CXR FINDINGS: Cardiovascular: Common arterial branch of the right brachiocephalic left common carotid arteries. Atherosclerosis of left subclavian artery  origin. Nonaneurysmal atherosclerotic aorta. The unenhanced pulmonary vessels are unremarkable. The heart size is normal with small anterior pericardial effusion without thickening noted. Mediastinum/Nodes: Small subcentimeter prevascular and paratracheal lymph nodes. 1 cm short axis subcarinal lymph node. Patent trachea and mainstem bronchi. Unremarkable CT appearance of the esophagus. The thyroid gland is unremarkable without dominant mass. Lungs/Pleura: Moderate to large layering left effusion with adjacent atelectasis. The right lung is clear. No pneumothorax or dominant mass. Upper Abdomen: Cirrhotic appearance of the liver with moderate to large volume of ascites. Musculoskeletal: No chest wall mass or suspicious bone lesions identified. Mild thoracic spondylosis. IMPRESSION: 1. Moderate to large layering left pleural effusion with adjacent atelectasis. 2. Cirrhotic liver with moderate to large volume of ascites. Aortic Atherosclerosis (ICD10-I70.0). Electronically Signed   By: Ashley Royalty M.D.   On: 03/14/2018 22:17   US Abdomen Complete  Result Date: 03/04/2018 CLINICAL DATA:  63 year old with hepatic cirrhosis. EXAM: ABDOMEN ULTRASOUND COMPLETE COMPARISON:  Visualized upper abdomen on CT chest 02/18/2018. FINDINGS: Gallbladder: Numerous shadowing gallstones, the largest measuring approximately 9 mm. Echogenic sludge. Mild gallbladder wall thickening up to approximately 4 mm. No pericholecystic fluid. Negative sonographic Murphy's sign according to the ultrasound technologist. Common bile duct: Diameter: Approximately 4 mm. No visible bile duct stones. Liver: Diffusely coarsened echotexture and irregular hepatic contour. No focal hepatic parenchymal abnormalities. Mildly increased echotexture diffusely. Portal vein is patent on color Doppler imaging with normal direction of blood flow towards the liver. IVC: Patent. Pancreas: Hypoechoic mass adjacent to or involving the pancreatic head or porta hepatis  lymph node measuring approximately 2.4 x 1.4 x 2.8 cm. Visualized pancreas otherwise unremarkable. The tail is obscured by overlying bowel gas. The pancreas was not included on the recent CT chest. Spleen: Enlarged, measuring approximately 15.8 x 15.8 x 5.2 cm, giving a volume of approximately 681 mL. No focal parenchymal abnormality. Right Kidney: Length: Approximately 9.0 cm. No hydronephrosis. Well-preserved cortex. Mildly echogenic parenchyma. Approximate 1.2 x 1.1 x 2.0 cm mildly complex cyst with a thin internal septation involving the UPPER pole of the RIGHT kidney. No solid renal mass. Left Kidney: Length: Approximately 8.2 cm. No hydronephrosis. Well-preserved cortex. Mildly echogenic parenchyma. No focal parenchymal abnormalities. Abdominal aorta: Normal in caliber throughout its visualized course in the abdomen with evidence of atherosclerosis. Maximum diameter 2.4 cm. Other findings: Small amount of ascites. Large LEFT pleural effusion. IMPRESSION: 1. Hepatic cirrhosis.  No focal hepatic parenchymal abnormality. 2. Hypoechoic mass adjacent to or involving the pancreatic head versus enlarged porta hepatis lymph node. The pancreas was not included on the recent CT chest, so a CT of the abdomen and pelvis with contrast is recommended in further evaluation. 3. Cholelithiasis.  No sonographic evidence of acute cholecystitis. 4. Splenomegaly without focal splenic parenchymal abnormality, indicating portal hypertension. 5. Patent portal vein with normal antegrade hepatopetal flow. 6. Small amount of ascites. 7. Large LEFT pleural effusion. Electronically Signed   By: Evangeline Dakin M.D.   On: 03/04/2018  17:04   [place] Intraoperative  Result Date: 03/19/2018 INDICATION: 63 year old with cirrhosis and recurrent left hydrothorax. Plan for nuclear medicine ascites - hydrothorax examination. Plan for an ultrasound-guided paracentesis and injection of radiopharmaceutical into the ascites. Left thoracentesis was  performed prior to this procedure. EXAM: ULTRASOUND GUIDED PARACENTESIS INJECTION OF RADIOPHARMACEUTICAL INTO ASCITES MEDICATIONS: None. COMPLICATIONS: None immediate. PROCEDURE: Informed written consent was obtained from the patient after a discussion of the risks, benefits and alternatives to treatment. A timeout was performed prior to the initiation of the procedure. Initial ultrasound scanning demonstrates a small amount of ascites within the right upper abdominal quadrant. The right upper abdomen was prepped and draped in the usual sterile fashion. 1% lidocaine with was used for local anesthesia. Following this, a 6 Fr Safe-T-Centesis catheter was introduced. An ultrasound image was saved for documentation purposes. The paracentesis was performed. 60 mL of opaque yellow fluid was removed. The radiopharmaceutical was injected through the Safe-T-Centesis catheter. Catheter was flushed with sterile saline. The catheter was removed and a dressing was applied. The patient tolerated the procedure well without immediate post procedural complication. FINDINGS: Small amount of fluid around the liver. Small amount of fluid in the left lower quadrant. 60 mL of fluid was removed from the right upper quadrant. The radiopharmaceutical was injected into the right upper quadrant. IMPRESSION: Successful ultrasound-guided paracentesis yielding 60 mL of ascites. Fluid was sent for labs. Radiopharmaceutical was injected through the paracentesis catheter for the nuclear medicine examination. Electronically Signed   By: Markus Daft M.D.   On: 03/19/2018 17:06   US Renal  Result Date: 03/30/2018 CLINICAL DATA:  Renal failure.  Cirrhosis. EXAM: RENAL / URINARY TRACT ULTRASOUND COMPLETE COMPARISON:  CT scan March 20, 2018 FINDINGS: Right Kidney: Renal measurements: 9 x 4.6 x 5.3 cm = volume: 114 mL. Contain in upper pole levin mm complex cysts. Contains a 7 mm lower pole cyst. Increased cortical echogenicity. Left Kidney: Renal  measurements: 7.9 x 3.8 x 4.7 cm = volume: 73 mL. Increased cortical echogenicity. Bladder: Appears normal for degree of bladder distention. IMPRESSION: 1. Two small cysts in the right kidney, 1 of which is mildly complex. Medical renal disease with increased cortical echogenicity. No hydronephrosis. 2. Nodular liver consistent with cirrhosis.  Ascites. 3. The spleen is prominent measuring up to 13.2 cm. However, this volume of 357 cc does not meet the criteria for splenomegaly. Electronically Signed   By: Dorise Bullion III M.D   On: 03/30/2018 16:13   Nm Interstitial Rad Source Applic Complex  Result Date: 03/19/2018 CLINICAL DATA:  63 year old with cirrhosis and recurrent left hydrothorax. Patient is being evaluated for a hepatic hydrothorax. EXAM: NUCLEAR MEDICINE SULFUR COLLOID PERITONEAL SCINTIGRAPHY TECHNIQUE: Ultrasound-guided left thoracentesis was performed. 1.1 L of left pleural fluid was removed but not all of the fluid was removed. Subsequently, an ultrasound-guided paracentesis was performed in the right upper quadrant. 60 mL of peritoneal fluid was removed. Technetium 99 M sulfur colloid was injected through the paracentesis catheter. Paracentesis catheter was removed. RADIOPHARMACEUTICALS:  7 millicuries technetium 99 M sulfur colloid COMPARISON:  Chest CT 03/14/2018 FINDINGS: Imaging was obtained over 1 hour. The initial images demonstrated radiopharmaceutical throughout the peritoneal cavity. The largest peritoneal pockets are in the right upper quadrant and left lower quadrant. Over time, radiopharmaceutical starts to accumulate in the left hemithorax. Findings are compatible with a trans diaphragmatic connection between the peritoneal cavity and the left pleural space. IMPRESSION: Study is positive for connection between the peritoneal space and left  pleural space. 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 Electronically Signed   By: Markus Daft M.D.   On: 03/19/2018  17:24   US Paracentesis  Result Date: 03/19/2018 INDICATION: 63 year old with cirrhosis and recurrent left hydrothorax. Plan for nuclear medicine ascites - hydrothorax examination. Plan for an ultrasound-guided paracentesis and injection of radiopharmaceutical into the ascites. Left thoracentesis was performed prior to this procedure. EXAM: ULTRASOUND GUIDED PARACENTESIS INJECTION OF RADIOPHARMACEUTICAL INTO ASCITES MEDICATIONS: None. COMPLICATIONS: None immediate. PROCEDURE: Informed written consent was obtained from the patient after a discussion of the risks, benefits and alternatives to treatment. A timeout was performed prior to the initiation of the procedure. Initial ultrasound scanning demonstrates a small amount of ascites within the right upper abdominal quadrant. The right upper abdomen was prepped and draped in the usual sterile fashion. 1% lidocaine with was used for local anesthesia. Following this, a 6 Fr Safe-T-Centesis catheter was introduced. An ultrasound image was saved for documentation purposes. The paracentesis was performed. 60 mL of opaque yellow fluid was removed. The radiopharmaceutical was injected through the Safe-T-Centesis catheter. Catheter was flushed with sterile saline. The catheter was removed and a dressing was applied. The patient tolerated the procedure well without immediate post procedural complication. FINDINGS: Small amount of fluid around the liver. Small amount of fluid in the left lower quadrant. 60 mL of fluid was removed from the right upper quadrant. The radiopharmaceutical was injected into the right upper quadrant. IMPRESSION: Successful ultrasound-guided paracentesis yielding 60 mL of ascites. Fluid was sent for labs. Radiopharmaceutical was injected through the paracentesis catheter for the nuclear medicine examination. Electronically Signed   By: Markus Daft M.D.   On: 03/19/2018 17:06   Ct Abdomen W Wo Contrast  Result Date: 03/06/2018 CLINICAL DATA:   Pancreatic mass seen on earlier [place]. Pancreatic mass workup . Cirrhosis. Epigastric and LUQ pain. 39m of Omni 300 used. Pt could only tolerate laying on her left side for scan.^1057mOMNIPAQUE IOHEXOL 300 MG/ML SOLNPancreatic mass EXAM: CT ABDOMEN WITHOUT AND WITH CONTRAST TECHNIQUE: Multidetector CT imaging of the abdomen was performed following the standard protocol before and following the bolus administration of intravenous contrast. CONTRAST:  10025mMNIPAQUE IOHEXOL 300 MG/ML  SOLN COMPARISON:  Ultrasound 03/04/2010 FINDINGS: Lower chest:  Large LEFT pleural effusion with passive atelectasis. Hepatobiliary: Nodular liver. Caudate lobe is enlarged. Portal veins are patent. Multiple gallstones within lumen gallbladder. No enhancing hepatic lesion. No intrahepatic or extrahepatic biliary duct dilatation. Pancreas: No abnormality of the pancreatic head body or tail. No duct dilatation. Periampullary duodenum diverticulum noted. Lymph node position inferior to the pancreas adjacent to the portal vein measures 16 mm (image 49/4) Spleen: Spleen borderline enlarged. Adrenals/urinary tract: LEFT kidney is normal. RIGHT kidney normal. No adrenal abnormality Stomach/Bowel: Stomach and limited of the small bowel is unremarkable Vascular/Lymphatic: Abdominal aortic normal caliber. No retroperitoneal periportal lymphadenopathy. Musculoskeletal: No aggressive osseous lesion IMPRESSION: 1. No pancreatic lesion identified. 2. Peripancreatic lymph node is mildly enlarged. 3. Small periampullary duodenum diverticulum present. 4. Morphologic changes in liver consistent with cirrhosis. No biliary duct dilatation of the intrahepatic or extrahepatic bile ducts. 5. No enhancing hepatic lesion. 6. Cholelithiasis. 7. Moderate volume of intraperitoneal free fluid. 8. Large LEFT pleural effusion Electronically Signed   By: SteSuzy BouchardD.   On: 03/06/2018 16:31   Dg Bone Density (dxa)  Result Date: 03/11/2018 EXAM: DUAL X-RAY  ABSORPTIOMETRY (DXA) FOR BONE MINERAL DENSITY IMPRESSION: Dear Dr, RosLutricia Feilour patient LesTevis Congermpleted a  FRAX assessment on 03/11/2018 using the [place] (analysis version: 14.10) manufactured by [employer]. The following summarizes the results of our evaluation. PATIENT BIOGRAPHICAL: Name: [place], Oborn Patient ID: [id number] Birth Date: 1955/05/02 Height:    62.5 in. Gender:     Female    [age]       Weight:    158.4 lbs. Ethnicity:  White                            Exam Date: 03/11/2018 FRAX* RESULTS:  (version: 3.5) 10-year Probability of Fracture1 Major Osteoporotic Fracture2 Hip Fracture 8.1% 0.7% Population: [place] (Caucasian) Risk Factors: None Based on Femur (Left) Neck BMD 1 -The 10-year probability of fracture may be lower than reported if the patient has received treatment. 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 ASSESSMENT: The probability of a major osteoporotic fracture is 8.1% within the next ten years. The probability of a hip fracture is 0.7% within the next ten years. . Technologist: SCE PATIENT BIOGRAPHICAL: Name: Ciin, Brazzel Patient ID: [id number] Birth Date: [birth year] Height: 62.5 in. Gender: Female Exam Date: 03/11/2018 Weight: 158.4 lbs. Indications: Asthma, Caucasian, Cirrhosis, Early Menopause, Postmenopausal, Previous Smoker Fractures: Treatments: Gabapentin, primidone ASSESSMENT: The BMD measured at Femur Neck Left is 0.833 g/cm2 with a T-score of -1.5. This patient is considered osteopenic according to [facility]) criteria. The quality of the scan is good. Site Region Measured Measured WHO Young Adult BMD Date       Age      Classification T-score AP Spine L1-L4 03/11/2018 63.0 Osteopenia -1.5 1.007 g/cm2 DualFemur Neck Left  03/11/2018 61.0 Osteopenia -1.5 0.833 g/cm2 World Health Organization [facility]) criteria for post-menopausal, Caucasian Women: Normal:       T-score at or above -1 SD Osteopenia:   T-score between -1 and -2.5 SD Osteoporosis: T-score at or below -2.5 SD RECOMMENDATIONS: 1. All patients should optimize calcium and vitamin D intake. 2. Consider FDA-approved medical therapies in postmenopausal women and men aged 23 years and older, based on the following: a. A hip or vertebral(clinical or morphometric) fracture b. T-score < -2.5 at the femoral neck or spine after appropriate evaluation to exclude secondary causes c. Low bone mass (T-score between -1.0 and -2.5 at the femoral neck or spine) and a 10-year probability of a hip fracture > 3% or a 10-year probability of a major osteoporosis-related fracture > 20% based on the [place]-adapted WHO algorithm d. Clinician judgment and/or patient preferences may indicate treatment for people with 10-year fracture probabilities above or below these levels FOLLOW-UP: People with diagnosed cases of osteoporosis or at high risk for fracture should have regular bone mineral density tests. For patients eligible for Medicare, routine testing is allowed once every 2 years. The testing frequency can be increased to one year for patients who have rapidly progressing disease, those who are receiving or discontinuing medical therapy to restore bone mass, or have additional risk factors. I have reviewed this report, and agree with the above findings. [facility] Electronically Signed   By: Lowella Grip III M.D.   On: 03/11/2018 11:23   Dg Chest Port 1 View  Result Date: 03/28/2018 CLINICAL DATA:  Recurrent effusion EXAM: PORTABLE CHEST 1 VIEW COMPARISON:  03/27/2018, 03/26/2018,  03/24/2018 FINDINGS: Interval diffuse hazy opacity left thorax likely due to layering effusion, moderate in size. This is increased compared to prior. Underlying edema not excluded. Stable cardiomediastinal  silhouette. Left basilar airspace disease. IMPRESSION: Increased moderate left pleural effusion with development of diffuse hazy opacity in the left thorax which may reflect layering fluid and or edema. Increased airspace disease at the left base. Electronically Signed   By: Donavan Foil M.D.   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 EXAM: PORTABLE CHEST 1 VIEW COMPARISON:  Chest radiograph-03/26/2018 FINDINGS: Grossly unchanged cardiac silhouette and mediastinal contours. Interval reduction in persistent moderate size left-sided effusion post thoracentesis. No pneumothorax. Improved aeration of the left mid and lower lung with persistent left basilar heterogeneous/consolidative opacities. No new focal airspace opacities. No definite right-sided pleural effusion. No evidence of edema. No acute osseous abnormalities. IMPRESSION: 1. Interval reduction in persistent moderate size left-sided effusion post thoracentesis. No pneumothorax. 2. Improved aeration of the left lung base with persistent left basilar atelectasis. Electronically Signed   By: Sandi Mariscal M.D.   On: 03/27/2018 13:34   Dg Chest Port 1 View  Result Date: 03/24/2018 CLINICAL DATA:  Post left thoracentesis EXAM: PORTABLE CHEST 1 VIEW COMPARISON:  03/21/2018 FINDINGS: Moderate to large left pleural effusion minimally changed since prior study. No pneumothorax. Left basilar opacity, likely atelectasis. No confluent opacity on the right. Heart is upper limits normal in size. IMPRESSION: Moderate to large-sized left pleural effusion minimally changed. No pneumothorax. Continued left base atelectasis or infiltrate. Electronically Signed   By: Rolm Baptise M.D.   On: 03/24/2018 12:46   Dg Chest Port 1 View  Result Date: 03/19/2018 CLINICAL DATA:  Status post thoracentesis. EXAM: PORTABLE CHEST 1 VIEW COMPARISON:  03/14/2018  FINDINGS: 1702 hours. Moderate to large left pleural effusion noted without evidence for pneumothorax. Left base collapse/consolidation. The cardio pericardial silhouette is enlarged. The visualized bony structures of the thorax are intact. Telemetry leads overlie the chest. IMPRESSION: 1. No evidence for pneumothorax status post thoracentesis. 2. Moderate to large left pleural effusion. Electronically Signed   By: Misty Stanley M.D.   On: 03/19/2018 18:56   Dg Chest Portable 1 View  Result Date: 03/14/2018 CLINICAL DATA:  Shortness of breath. EXAM: PORTABLE CHEST 1 VIEW COMPARISON:  Chest x-ray 03/06/2018. FINDINGS: Mediastinum and hilar structures normal. Large left pleural effusion, increased in size from prior exam. Underlying left lung atelectasis/infiltrate can not be excluded. No pneumothorax. Heart size most likely stable. IMPRESSION: Large left pleural effusion, increased from prior exam. Electronically Signed   By: Marcello Moores  Register   On: 03/14/2018 13:19   Dg Chest Port 1 View  Result Date: 03/06/2018 CLINICAL DATA:  Post large volume left-sided thoracentesis. EXAM: PORTABLE CHEST 1 VIEW COMPARISON:  Chest radiograph-02/20/2018; CT abdomen pelvis-earlier same day FINDINGS: Interval reduction in persistent moderate size left-sided effusion post large volume thoracentesis. No pneumothorax. Improved aeration of left lung base with persistent left basilar heterogeneous/consolidative opacities. No definite evidence of right-sided pleural effusion. No new focal airspace opacities. Unchanged cardiac silhouette and mediastinal contours. No acute osseous abnormalities. IMPRESSION: Interval reduction in persistent moderate sized left-sided effusion post thoracentesis. No pneumothorax. Electronically Signed   By: Sandi Mariscal M.D.   On: 03/06/2018 16:02   Mm 3d Screen Breast Bilateral  Result Date: 03/12/2018 CLINICAL DATA:  Screening. EXAM: DIGITAL SCREENING BILATERAL MAMMOGRAM WITH TOMO AND CAD  COMPARISON:  Previous exam(s). ACR Breast Density Category b: There are scattered areas of fibroglandular density. FINDINGS: There are no findings suspicious for malignancy. Images were processed with CAD. IMPRESSION: No mammographic evidence of malignancy. A result letter of this screening mammogram will be mailed directly to the patient. RECOMMENDATION: Screening mammogram in one year. (Code:SM-B-01Y) BI-RADS CATEGORY  1: Negative. Electronically Signed   By: Dorise Bullion III M.D   On: 03/12/2018 14:44   Nm Hepato Biliary Leak  Result Date: 03/17/2018 CLINICAL DATA:  Cholelithiasis. Nausea and vomiting. Hepatic cirrhosis. EXAM: NUCLEAR MEDICINE HEPATOBILIARY IMAGING TECHNIQUE: Sequential images of the abdomen were obtained out to 60 minutes following intravenous administration of radiopharmaceutical. RADIOPHARMACEUTICALS:  5.1 mCi Tc-61m Choletec IV COMPARISON:  CT on 03/14/2018 FINDINGS: Delayed uptake and biliary excretion of activity by the liver is seen, consistent with known hepatic cirrhosis. Gallbladder activity is visualized, consistent with patency of cystic duct. Biliary activity passes into small bowel, consistent with patent common bile duct. IMPRESSION: No evidence of acute cholecystitis or biliary ductal dilatation. Hepatocellular dysfunction, consistent with hepatic cirrhosis. Electronically Signed   By: JEarle GellM.D.   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 Request for therapeutic thoracentesis today in IR. EXAM: ULTRASOUND GUIDED LEFT THORACENTESIS MEDICATIONS: 10 mL 1% lidocaine. COMPLICATIONS: None immediate. PROCEDURE: An ultrasound guided thoracentesis was thoroughly discussed with the patient and questions answered. The benefits, risks, alternatives and complications were also discussed. The patient understands and  wishes to proceed with the procedure. Written consent was obtained. Ultrasound was performed to localize and mark an adequate pocket of fluid in the left chest. The area was then prepped and draped in the normal sterile fashion. 1% Lidocaine was used for local anesthesia. Under ultrasound guidance a 6 Fr Safe-T-Centesis catheter was introduced. Thoracentesis was performed. The catheter was removed and a dressing applied. FINDINGS: A total of approximately 2.1 L of chylous fluid was removed. IMPRESSION: Successful ultrasound guided left thoracentesis yielding 2.1 L of pleural fluid. Read by SCandiss Norse PA-C Electronically Signed   By: MJerilynn Mages  Shick M.D.   On: 03/30/2018 12:54   [place]oracentesis Asp Pleural Space W/img Guide  Result Date: 03/27/2018 INDICATION: History of left-sided hepatic hydrothorax now with recurrent symptomatic left-sided pleural effusion. Please perform ultrasound-guided thoracentesis for therapeutic purposes. EXAM: [place]ORAC[place]ESIS ASP PLEURAL SPACE W/IMG GUIDE COMPARISON:  Chest radiograph-03/26/2018 MEDICATIONS: None. COMPLICATIONS: None immediate. TECHNIQUE: Informed written consent was obtained from the patient after a discussion of the risks, benefits and alternatives to treatment. A timeout was performed prior to the initiation of the procedure. Initial ultrasound scanning demonstrates a large minimally complex though predominantly anechoic left-sided pleural effusion. The lower chest was prepped and draped in the usual sterile fashion. 1% lidocaine was used for local anesthesia. An ultrasound image was saved for documentation purposes. An 8 Fr Safe-T-Centesis catheter was introduced. The thoracentesis was performed. The catheter was removed and a dressing was applied. The patient tolerated the procedure well without immediate post procedural complication. The patient was escorted to have an upright chest radiograph. FINDINGS: A total of approximately 2.2 liters of serous fluid was  removed. IMPRESSION: Successful ultrasound-guided left sided thoracentesis yielding 2.2 liters of pleural fluid. Electronically Signed   By: JSandi MariscalM.D.   On: 03/27/2018 13:37   [place]oracentesis Asp Pleural Space W/img Guide  Result Date: 03/24/2018 INDICATION: Patient  with history of hepatic hydrothorax, cirrhosis and recurrent left pleural effusion who presents today originally for paracentesis in order to obtain flow cytometry to r/o malignancy prior to possible TIPS procedure. Limited abdominal US shows scant peritoneal fluid not amenable to percutaneous drainage. Diagnostic and therapeutic thoracentesis performed in order to obtain requested labs in lieu of paracentesis given patient's history of hepatic hydrothorax. EXAM: ULTRASOUND GUIDED LEFT THORACENTESIS MEDICATIONS: 20 mL 1% lidocaine. COMPLICATIONS: None immediate. PROCEDURE: An ultrasound guided thoracentesis was thoroughly discussed with the patient and questions answered. The benefits, risks, alternatives and complications were also discussed. The patient understands and wishes to proceed with the procedure. Written consent was obtained. Ultrasound was performed to localize and mark an adequate pocket of fluid in the left chest. The area was then prepped and draped in the normal sterile fashion. 1% Lidocaine was used for local anesthesia. Under ultrasound guidance a 6 Fr Safe-T-Centesis catheter was introduced. Thoracentesis was performed. The catheter was removed and a dressing applied. FINDINGS: A total of approximately 1.1 L of pink milky fluid was removed. Samples were sent to the laboratory as requested by the clinical team. IMPRESSION: Successful ultrasound guided left thoracentesis yielding 1.1 L of pleural fluid. Read by Candiss Norse, PA-C Electronically Signed   By: Lucrezia Europe M.D.   On: 03/24/2018 12:49   US Thoracentesis Asp Pleural Space W/img Guide  Result Date: 03/19/2018 INDICATION: 63 year old with cirrhosis and  recurrent left hydrothorax. Patient is being evaluated for a connection between the peritoneal and left pleural space with a nuclear medicine examination. Patient has a large amount of left pleural fluid on ultrasound examination. Plan to remove some of the left pleural fluid prior to the nuclear medicine examination. EXAM: ULTRASOUND GUIDED LEFT THORACENTESIS MEDICATIONS: None. COMPLICATIONS: None immediate. PROCEDURE: An ultrasound guided thoracentesis was thoroughly discussed with the patient and questions answered. The benefits, risks, alternatives and complications were also discussed. The patient understands and wishes to proceed with the procedure. Written consent was obtained. Ultrasound was performed to localize and mark an adequate pocket of fluid in the left chest. The area was then prepped and draped in the normal sterile fashion. 1% Lidocaine was used for local anesthesia. Under ultrasound guidance a 6 Fr Safe-T-Centesis catheter was introduced. Thoracentesis was performed. The catheter was removed and a dressing applied. FINDINGS: A total of approximately 1.1 L of opaque yellow fluid was removed. Samples were sent to the laboratory as requested by the clinical team. IMPRESSION: Successful ultrasound guided left thoracentesis yielding 1.1 L of pleural fluid. Electronically Signed   By: Markus Daft M.D.   On: 03/19/2018 17:11   US Thoracentesis Asp Pleural Space W/img Guide  Result Date: 03/14/2018 CLINICAL DATA:  Recurrent left pleural effusion. EXAM: ULTRASOUND GUIDED LEFT THORACENTESIS COMPARISON:  None. PROCEDURE: An ultrasound guided thoracentesis was thoroughly discussed with the patient and questions answered. The benefits, risks, alternatives and complications were also discussed. The patient understands and wishes to proceed with the procedure. Written consent was obtained. Ultrasound was performed to localize and mark an adequate pocket of fluid in the left chest. The area was then prepped  and draped in the normal sterile fashion. 1% Lidocaine was used for local anesthesia. Under ultrasound guidance a 6 French Safe-T-Centesis catheter was introduced. Thoracentesis was performed. The catheter was removed and a dressing applied. COMPLICATIONS: None FINDINGS: A total of approximately 2.3 L of cloudy, yellowish-brown fluid was removed. A fluid sample was sent for laboratory analysis. IMPRESSION: Successful ultrasound guided left thoracentesis yielding  2.3 L of pleural fluid. Electronically Signed   By: Aletta Edouard M.D.   On: 03/14/2018 16:13   US Thoracentesis Asp Pleural Space W/img Guide  Result Date: 03/06/2018 INDICATION: History of cirrhosis now with recurrent symptomatic left-sided pleural effusion worrisome for hepatic hydrothorax. Please from ultrasound-guided thoracentesis for therapeutic purposes. EXAM: US THORACENTESIS ASP PLEURAL SPACE W/IMG GUIDE COMPARISON:  Ultrasound-guided thoracentesis-03/22/2018 (yielding 2.2 L of pleural fluid); CT abdomen pelvis-earlier same day MEDICATIONS: None. COMPLICATIONS: None immediate. TECHNIQUE: Informed written consent was obtained from the patient after a discussion of the risks, benefits and alternatives to treatment. A timeout was performed prior to the initiation of the procedure. Initial ultrasound scanning demonstrates a large anechoic left-sided pleural effusion. The lower chest was prepped and draped in the usual sterile fashion. 1% lidocaine was used for local anesthesia. An ultrasound image was saved for documentation purposes. An 8 Fr Safe-T-Centesis catheter was introduced. The thoracentesis was performed. Despite residual fluid within the left pleural space, the patient wished to terminate the procedure given left-sided chest pain. As such, the catheter was removed and a dressing was applied. The patient tolerated the procedure well without immediate post procedural complication. The patient was escorted to have an upright chest  radiograph. FINDINGS: A total of approximately 1.9 liters of amber colored serous fluid was removed. IMPRESSION: Successful ultrasound-guided left sided thoracentesis yielding 1.9 liters of pleural fluid. Electronically Signed   By: Sandi Mariscal M.D.   On: 03/06/2018 16:04     LOS: 4 days   Oren Binet, MD  [facility]  If 7PM-7AM, please contact night-coverage  Please page via [email or web address]  Go to [email or web address] and use [place]'s universal password to access. If you do not have the password, please contact the hospital operator.  Locate the [facility] provider you are looking for under [facility] and page to a number that you can be directly reached. If you still have difficulty reaching the provider, please page the [facility] (Director on Call) for the Hospitalists listed on amion for assistance.  04/01/2018, 2:52 PM

## 2018-04-01 NOTE — Progress Notes (Signed)
IR continues to follow for possible TIPS procedure.  Ideally MELD score would be less than 20 prior to proceeding as MELD >/=20 is associated with increased risk of mortality. Patient's MELD score today is 20. Her creatinine is 1.96 today, essentially unchanged from yesterday which may represent a new baseline.  Palliative medicine has consulted with the patient. She remains DNR with goal to improve quality of life. If kidney function has plateaued and proves to be the greatest barrier to improvement in MELD score, then TIPS may not be in her best interest.  Will re-evaluate daily. Have ordered a repeat lab work for tomorrow.   Brynda Greathouse, MS RD PA-C

## 2018-04-01 NOTE — Consult Note (Signed)
Consultation Note Date: 04/01/2018   Patient Name: Kelly Mclean  DOB: 08-May-1955  MRN: [id number]  Age / Sex: 63 y.o., female  PCP: Marnee Guarneri, MD Referring Physician: Jonetta Osgood, MD  Reason for Consultation: Establishing goals of care  HPI/Patient Profile: 63 y.o. female  with past medical history of HTN, cirrhosis of the liver (w/ recurrent pleural effusions), cervical dystonia, PTSD, anemia admitted on 03/28/2018 from [facility] for TIPS procedure due hepatic hydrothorax. Procedure has been deferred awaiting MELD score to be lower than 20. She had received diuresis earlier and developed acute on chronic renal failure, suspicious for hepatorenal syndrome, however, is responding now to albumin and IV fluid resuscitation and Cr is trending down.  Clinical Assessment and Goals of Care:  I have reviewed medical records including EPIC notes, labs and imaging, assessed the patient and then met at the bedside along with the patient and her spouse  to discuss diagnosis prognosis, GOC, EOL wishes, disposition and options.  I introduced Palliative Medicine as specialized medical care for people living with serious illness. It focuses on providing relief from the symptoms and stress of a serious illness. The goal is to improve quality of life for both the patient and the family.  We discussed a brief life review of the patient. She and her spouse have been married for 28 years. They moved here from [place]. No children. She is disabled due to cervical dystonia.   As far as functional and nutritional status- she has lost a significant amount of weight in the last few months. She was able to ambulate in the home- received care from an in home aide two days a week through [facility]. Able to toilet and dress herself, but needed assistance with cleaning and cooking. She attended [facility] programming as well.   We discussed her  current illness and what it means in the larger context of her on-going co-morbidities.  Natural disease trajectory and expectations at EOL were discussed. She expressed that she felt her illness would get worse. She is hopeful that the TIPS procedure will reduce the fluid in her lungs and decrease the number of thoracenteneses that she needs. She is somewhat hopeful to also have an increase in her functional status with this procedure, even if it is brief. She does understand that her illness is progressive. We discussed her prognosis and MELD score and what that means. When asked about her desire to be considered for liver transplant she notes that she is unsure- she isn't sure she could tolerate such an invasive procedure.   For now her [place] are to receive TIPS procedure and return home with [facility] services in place and she requests continued Palliative followup in her home.  Patient is currently DNR.   Questions and concerns were addressed.  Hard Choices booklet left for review. The family was encouraged to call with questions or concerns.   Primary Decision Maker PATIENT- with support of her spouse- John    SUMMARY OF RECOMMENDATIONS - Continue DNR status -Continue  full scope care -PMT will continue to follow for patient progression -Care manager referral for Palliative oupatient services at home    Code Status/Advance Care Planning:  DNR  Palliative Prophylaxis:   Frequent Pain Assessment    Prognosis:    > 12 months  Discharge Planning: Home with Palliative Services  Primary Diagnoses: Present on Admission: . Hydrothorax . Ascites . Cirrhosis ([place]) . Acute kidney injury superimposed on chronic kidney disease ([place])   I have reviewed the medical record, interviewed the patient and family, and examined the patient. The following aspects are pertinent.  Past Medical History:  Diagnosis Date  . Anemia   . Asthma   . Cirrhosis ([place])   . Hypertension   . Pleural effusion     Social History   Socioeconomic History  . Marital status: Married    Spouse name: Not on file  . Number of children: Not on file  . Years of education: Not on file  . Highest education level: Not on file  Occupational History  . Not on file  Social Needs  . Financial resource strain: Not on file  . Food insecurity:    Worry: Not on file    Inability: Not on file  . Transportation needs:    Medical: Not on file    Non-medical: Not on file  Tobacco Use  . Smoking status: Former Smoker    Last attempt to quit: 1990    Years since quitting: 30.2  . Smokeless tobacco: Never Used  Substance and Sexual Activity  . Alcohol use: Not Currently  . Drug use: Not Currently  . Sexual activity: Not Currently  Lifestyle  . Physical activity:    Days per week: Not on file    Minutes per session: Not on file  . Stress: Not on file  Relationships  . Social connections:    Talks on phone: Not on file    Gets together: Not on file    Attends religious service: Not on file    Active member of club or organization: Not on file    Attends meetings of clubs or organizations: Not on file    Relationship status: Not on file  Other Topics Concern  . Not on file  Social History Narrative   Patient moved from [place].  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  Lives with her husband in [place].  No alcohol abuse or smoking.   Family History  Problem Relation Age of Onset  . Breast cancer Maternal Aunt 84   Scheduled Meds: . sodium chloride   Intravenous Once  . busPIRone  10 mg Oral TID  . calcium carbonate  2 tablet Oral BID  . citalopram  20 mg Oral Daily  . docusate sodium  100 mg Oral BID  . famotidine  20 mg Oral QHS  . gabapentin  200 mg Oral QHS  . midodrine  10 mg Oral TID WC  . octreotide  150 mcg Subcutaneous Q8H   Continuous Infusions: PRN Meds:.albuterol, ondansetron **OR** ondansetron (ZOFRAN) IV Medications Prior to Admission:  Prior to Admission medications     Medication Sig Start Date End Date Taking? Authorizing Provider  albuterol (PROVENTIL) (2.5 MG/3ML) 0.083% nebulizer solution Inhale 3 mLs (2.5 mg total) into the lungs every 6 (six) hours as needed for wheezing or shortness of breath. 03/27/18  Yes Epifanio Lesches, MD  busPIRone (BUSPAR) 10 MG tablet Take 10 mg by mouth 3 (three) times daily.   Yes [provider]  calcium carbonate (TUMS - DOSED IN MG ELEMENTAL CALCIUM) 500 MG chewable tablet Chew 2 tablets by mouth 2 (two) times daily.   Yes [provider]  citalopram (CELEXA) 20 MG tablet Take 20 mg by mouth daily.   Yes [provider]  docusate sodium (COLACE) 100 MG capsule Take 100 mg by mouth 2 (two) times daily.   Yes [provider]  famotidine (PEPCID) 40 MG tablet Take 40 mg by mouth at bedtime.   Yes [provider]  furosemide (LASIX) 20 MG tablet Take 1 tablet (20 mg total) by mouth daily. 03/27/18  Yes Epifanio Lesches, MD  gabapentin (NEURONTIN) 100 MG capsule Take 200 mg by mouth at bedtime.   Yes [provider]  primidone (MYSOLINE) 50 MG tablet Take 50 mg by mouth 2 (two) times daily.   Yes [provider]   Allergies  Allergen Reactions  . Biaxin [Clarithromycin] Shortness Of Breath and Rash  . Penicillins Anaphylaxis and Other (See Comments)    Did it involve swelling of the face/tongue/throat, SOB, or low BP? Unknown Did it involve sudden or severe rash/hives, skin peeling, or any reaction on the inside of your mouth or nose? Unknown Did you need to seek medical attention at a hospital or doctor's office? Yes When did it last happen?childhood If all above answers are "NO", may proceed with cephalosporin use.    Marland Kitchen Zoloft [Sertraline Hcl] Other (See Comments)    Reaction: "made crazy"  . Milk-Related Compounds Diarrhea  . Lactose Intolerance (Gi)   . Other Other (See Comments)    Laughing gas pt turned gray,general anesthesia   Review of  Systems  Constitutional: Positive for activity change, fatigue and unexpected weight change.  Psychiatric/Behavioral: Negative for sleep disturbance.    Physical Exam Vitals signs and nursing note reviewed.  Cardiovascular:     Rate and Rhythm: Normal rate.  Pulmonary:     Effort: Pulmonary effort is normal.  Abdominal:     General: There is distension.  Skin:    General: Skin is warm and dry.     Coloration: Skin is jaundiced.  Neurological:     Mental Status: She is alert and oriented to person, place, and time.  Psychiatric:        Mood and Affect: Mood normal.        Behavior: Behavior normal.        Thought Content: Thought content normal.     Vital Signs: BP (!) 130/54 (BP Location: Right Arm)   Pulse 67   Temp 98.2 F (36.8 C) (Oral)   Resp 16   Ht 5' 2"  (1.575 m)   Wt 68.2 kg   SpO2 97%   BMI 27.50 kg/m  Pain Scale: 0-10   Pain Score: 0-No pain   SpO2: SpO2: 97 % O2 Device:SpO2: 97 % O2 Flow Rate: .O2 Flow Rate (L/min): 2 L/min  IO: Intake/output summary:   Intake/Output Summary (Last 24 hours) at 04/01/2018 1314 Last data filed at 04/01/2018 0900 Gross per 24 hour  Intake 506.95 ml  Output 2 ml  Net 504.95 ml    LBM: Last BM Date: 03/31/18 Baseline Weight: Weight: 70.5 kg Most recent weight: Weight: 68.2 kg     Palliative Assessment/Data: PPS: 50%   Flowsheet Rows     Most Recent Value  Intake Tab  Referral Department  Hospitalist  Unit at Time of Referral  Med/Surg Unit  Date Notified  03/30/18  Palliative Care Type  Return patient  Palliative Care  Reason for referral  Clarify Goals of Care  Date of Admission  03/28/18  # of days IP prior to Palliative referral  2  Clinical Assessment  Psychosocial & Spiritual Assessment  Palliative Care Outcomes      Thank you for this consult. Palliative medicine will continue to follow and assist as needed.   Time In: 1200 Time Out: 1345 Time Total: 105 minutes Prolonged services billed:  yes Greater than 50%  of this time was spent counseling and coordinating care related to the above assessment and plan.  Signed by: Mariana Kaufman, AGNP-C Palliative Medicine    Please contact Palliative Medicine Team phone at [phone] for questions and concerns.  For individual provider: See Shea Evans

## 2018-04-01 NOTE — Plan of Care (Signed)
Patient is making progress,  A&O x4,  received albumin 50 mg this shift, nausea relieved by Zofran 7m IV,  will continue to monitor.

## 2018-04-01 NOTE — Care Management Important Message (Signed)
Important Message  Patient Details  Name: Kelly Mclean MRN: [id number] Date of Birth: [birth year]   Medicare Important Message Given:  Yes    [name] Montine Circle 04/01/2018, 4:26 PM

## 2018-04-02 ENCOUNTER — Inpatient Hospital Stay (HOSPITAL_COMMUNITY): Payer: Medicare (Managed Care)

## 2018-04-02 DIAGNOSIS — J918 Pleural effusion in other conditions classified elsewhere: Secondary | ICD-10-CM

## 2018-04-02 DIAGNOSIS — K769 Liver disease, unspecified: Secondary | ICD-10-CM

## 2018-04-02 DIAGNOSIS — N19 Unspecified kidney failure: Secondary | ICD-10-CM

## 2018-04-02 LAB — CBC
HCT: 27.8 % — ABNORMAL LOW (ref 36.0–46.0)
HEMOGLOBIN: 9 g/dL — AB (ref 12.0–15.0)
MCH: 32.7 pg (ref 26.0–34.0)
MCHC: 32.4 g/dL (ref 30.0–36.0)
MCV: 101.1 fL — ABNORMAL HIGH (ref 80.0–100.0)
Platelets: 53 10*3/uL — ABNORMAL LOW (ref 150–400)
RBC: 2.75 MIL/uL — ABNORMAL LOW (ref 3.87–5.11)
RDW: 15 % (ref 11.5–15.5)
WBC: 2.2 10*3/uL — ABNORMAL LOW (ref 4.0–10.5)
nRBC: 0 % (ref 0.0–0.2)

## 2018-04-02 LAB — COMPREHENSIVE METABOLIC PANEL
ALT: 16 U/L (ref 0–44)
AST: 24 U/L (ref 15–41)
Albumin: 4.8 g/dL (ref 3.5–5.0)
Alkaline Phosphatase: 41 U/L (ref 38–126)
Anion gap: 5 (ref 5–15)
BUN: 34 mg/dL — ABNORMAL HIGH (ref 8–23)
CO2: 27 mmol/L (ref 22–32)
CREATININE: 2.27 mg/dL — AB (ref 0.44–1.00)
Calcium: 10.2 mg/dL (ref 8.9–10.3)
Chloride: 109 mmol/L (ref 98–111)
GFR calc non Af Amer: 23 mL/min — ABNORMAL LOW (ref >60)
GFR, EST AFRICAN AMERICAN: 26 mL/min — AB (ref >60)
Glucose, Bld: 88 mg/dL (ref 70–99)
Potassium: 4.5 mmol/L (ref 3.5–5.1)
SODIUM: 141 mmol/L (ref 135–145)
Total Bilirubin: 1.8 mg/dL — ABNORMAL HIGH (ref 0.3–1.2)
Total Protein: 6.3 g/dL — ABNORMAL LOW (ref 6.5–8.1)

## 2018-04-02 LAB — PROTIME-INR
INR: 1.7 — AB (ref 0.8–1.2)
Prothrombin Time: 19.4 seconds — ABNORMAL HIGH (ref 11.4–15.2)

## 2018-04-02 LAB — PHOSPHORUS: PHOSPHORUS: 3.5 mg/dL (ref 2.5–4.6)

## 2018-04-02 MED ORDER — PRIMIDONE 50 MG PO TABS
50.0000 mg | ORAL_TABLET | Freq: Two times a day (BID) | ORAL | Status: DC
  Administered 2018-04-02 – 2018-04-09 (×15): 50 mg via ORAL
  Filled 2018-04-02 (×14): qty 1

## 2018-04-02 MED ORDER — FAMOTIDINE 20 MG PO TABS
10.0000 mg | ORAL_TABLET | Freq: Every day | ORAL | Status: DC
  Administered 2018-04-02 – 2018-04-08 (×7): 10 mg via ORAL
  Filled 2018-04-02 (×7): qty 1

## 2018-04-02 NOTE — Progress Notes (Signed)
Referring Physician(s): * No referring provider recorded for this case *  Supervising Physician: Sandi Mariscal  Patient Status:  [facility] - In-pt  Chief Complaint: Recurrent effusion, hydrothorax  Subjective: "I'm tired.  I've been through a lot in my lifetime.  It's time."  Allergies: Biaxin [clarithromycin]; Penicillins; Zoloft [sertraline hcl]; Milk-related compounds; Lactose intolerance (gi); and Other  Medications: Prior to Admission medications   Medication Sig Start Date End Date Taking? Authorizing Provider  albuterol (PROVENTIL) (2.5 MG/3ML) 0.083% nebulizer solution Inhale 3 mLs (2.5 mg total) into the lungs every 6 (six) hours as needed for wheezing or shortness of breath. 03/27/18  Yes Epifanio Lesches, MD  busPIRone (BUSPAR) 10 MG tablet Take 10 mg by mouth 3 (three) times daily.   Yes [provider]  calcium carbonate (TUMS - DOSED IN MG ELEMENTAL CALCIUM) 500 MG chewable tablet Chew 2 tablets by mouth 2 (two) times daily.   Yes [provider]  citalopram (CELEXA) 20 MG tablet Take 20 mg by mouth daily.   Yes [provider]  docusate sodium (COLACE) 100 MG capsule Take 100 mg by mouth 2 (two) times daily.   Yes [provider]  famotidine (PEPCID) 40 MG tablet Take 40 mg by mouth at bedtime.   Yes [provider]  furosemide (LASIX) 20 MG tablet Take 1 tablet (20 mg total) by mouth daily. 03/27/18  Yes Epifanio Lesches, MD  gabapentin (NEURONTIN) 100 MG capsule Take 200 mg by mouth at bedtime.   Yes [provider]  primidone (MYSOLINE) 50 MG tablet Take 50 mg by mouth 2 (two) times daily.   Yes [provider]     Vital Signs: BP (!) 143/53 (BP Location: Left Arm)   Pulse 74   Temp 98.3 F (36.8 C) (Oral)   Resp 15   Ht 5' 2"  (1.575 m)   Wt 148 lb 9.4 oz (67.4 kg)   SpO2 100%   BMI 27.18 kg/m   Physical Exam Vitals signs and nursing note reviewed.  Constitutional:      General: She is not  in acute distress.    Appearance: Normal appearance.  HENT:     Mouth/Throat:     Mouth: Mucous membranes are dry.  Neck:     Musculoskeletal: Normal range of motion.  Cardiovascular:     Rate and Rhythm: Normal rate and regular rhythm.  Pulmonary:     Effort: Pulmonary effort is normal. No respiratory distress.     Breath sounds: Normal breath sounds.  Skin:    General: Skin is warm and dry.  Neurological:     General: No focal deficit present.     Mental Status: She is alert and oriented to person, place, and time.  Psychiatric:        Mood and Affect: Mood normal.        Behavior: Behavior normal.        Thought Content: Thought content normal.        Judgment: Judgment normal.     Imaging: Dg Chest 1 View  Result Date: 03/30/2018 CLINICAL DATA:  Status post thoracentesis. EXAM: CHEST  1 VIEW COMPARISON:  03/27/2017 FINDINGS: Midline trachea. Normal heart size. Small right pleural effusion is suspected. The left-sided effusion has decreased. No pneumothorax. Improved aeration with decreased left-sided atelectasis. Mild subsegmental atelectasis remains at both lung bases. IMPRESSION: Decreased left pleural effusion, without pneumothorax. Improved aeration with mild bibasilar atelectasis remaining. Electronically Signed   By: Adria Devon.D.  On: 03/30/2018 13:47   Dg Chest 2 View  Result Date: 04/02/2018 CLINICAL DATA:  Shortness of breath EXAM: CHEST - 2 VIEW COMPARISON:  Three days ago FINDINGS: Large and significantly increased left pleural effusion. Borderline cardiopericardial enlargement, distorted by pleural fluid. Streaky density at the medial right base also seen previously. IMPRESSION: Large left pleural effusion with significant re-accumulation since 3 days ago. Electronically Signed   By: Monte Fantasia M.D.   On: 04/02/2018 09:39   US Renal  Result Date: 03/30/2018 CLINICAL DATA:  Renal failure.  Cirrhosis. EXAM: RENAL / URINARY TRACT ULTRASOUND COMPLETE  COMPARISON:  CT scan March 20, 2018 FINDINGS: Right Kidney: Renal measurements: 9 x 4.6 x 5.3 cm = volume: 114 mL. Contain in upper pole levin mm complex cysts. Contains a 7 mm lower pole cyst. Increased cortical echogenicity. Left Kidney: Renal measurements: 7.9 x 3.8 x 4.7 cm = volume: 73 mL. Increased cortical echogenicity. Bladder: Appears normal for degree of bladder distention. IMPRESSION: 1. Two small cysts in the right kidney, 1 of which is mildly complex. Medical renal disease with increased cortical echogenicity. No hydronephrosis. 2. Nodular liver consistent with cirrhosis.  Ascites. 3. The spleen is prominent measuring up to 13.2 cm. However, this volume of 357 cc does not meet the criteria for splenomegaly. Electronically Signed   By: Dorise Bullion III M.D   On: 03/30/2018 16:13   US Thoracentesis Asp Pleural Space W/img Guide  Result Date: 03/30/2018 INDICATION: Patient with history of hepatic hydrothorax and recurrent symptomatic left pleural effusion. Request for therapeutic thoracentesis today in IR. EXAM: ULTRASOUND GUIDED LEFT THORACENTESIS MEDICATIONS: 10 mL 1% lidocaine. COMPLICATIONS: None immediate. PROCEDURE: An ultrasound guided thoracentesis was thoroughly discussed with the patient and questions answered. The benefits, risks, alternatives and complications were also discussed. The patient understands and wishes to proceed with the procedure. Written consent was obtained. Ultrasound was performed to localize and mark an adequate pocket of fluid in the left chest. The area was then prepped and draped in the normal sterile fashion. 1% Lidocaine was used for local anesthesia. Under ultrasound guidance a 6 Fr Safe-T-Centesis catheter was introduced. Thoracentesis was performed. The catheter was removed and a dressing applied. FINDINGS: A total of approximately 2.1 L of chylous fluid was removed. IMPRESSION: Successful ultrasound guided left thoracentesis yielding 2.1 L of pleural fluid.  Read by Candiss Norse, PA-C Electronically Signed   By: Jerilynn Mages.  Shick M.D.   On: 03/30/2018 12:54    Labs:  CBC: Recent Labs    03/30/18 0550 03/31/18 0208 04/01/18 0322 04/02/18 0330  WBC 2.4* 1.9* 1.9* 2.2*  HGB 7.4* 6.9* 7.9* 9.0*  HCT 22.3* 21.2* 24.1* 27.8*  PLT 54* 49* 48* 53*    COAGS: Recent Labs    03/14/18 1301 03/26/18 2000 04/02/18 0330  INR 1.33 1.4* 1.7*  APTT 32  --   --     BMP: Recent Labs    03/30/18 0550 03/31/18 0208 04/01/18 0322 04/02/18 0330  NA 135 134* 138 141  K 4.3 4.9 4.6 4.5  CL 107 107 107 109  CO2 24 22 24 27   GLUCOSE 79 132* 105* 88  BUN 44* 38* 33* 34*  CALCIUM 8.8* 9.0 9.8 10.2  CREATININE 2.06* 1.94* 1.96* 2.27*  GFRNONAA 25* 27* 27* 23*  GFRAA 29* 32* 31* 26*    LIVER FUNCTION TESTS: Recent Labs    03/28/18 2130 03/30/18 0550 04/01/18 0322 04/02/18 0330  BILITOT 1.2 1.3* 2.0* 1.8*  AST 43* 30 28  24  ALT 31 24 18 16   ALKPHOS 87 61 34* 41  PROT 5.9* 5.5* 6.3* 6.3*  ALBUMIN 2.3* 3.0* 5.0 4.8    Assessment and Plan: Hydrothorax, recurrent left pleural effusion Patient being considered for TIPS procedure, however her medical status has deteriorated.  Her MELD score continues to climb (22 today) and her renal function continues to worsen. Dr. Pascal Lux met with patient at bedside to discuss the procedure in detail.  All hypothetical senarios, risks, and benefits were explored with the patient.  Given that she is not a candidate for dialysis and that TIPS is high risk, she has decided to go home with hospice care. She would like to proceed with PleurX catheter placement for symptom management of her recurrent pleural effusion.   Risks and benefits discussed with the patient including, but not limited to bleeding, infection, sepsis.   All of the patient's questions were answered, patient is agreeable to proceed. Consent signed and in chart.   Electronically Signed: Docia Barrier, PA 04/02/2018, 5:31 PM   I  spent a total of 15 Minutes at the the patient's bedside AND on the patient's hospital floor or unit, greater than 50% of which was counseling/coordinating care for hydrothorax

## 2018-04-02 NOTE — Progress Notes (Addendum)
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  She is followed by [facility] and is considered for TIPS procedure.  She was evaluated by nephrology and was found to have acute on chronic kidney disease.  In January her baseline serum creatinine appears to be 1.6 mg/dL she was placed on Midrin octreotide and IV albumin.  Her prognosis was thought to be poor.  She is resting comfortably.  Long discussion with patient today.  End-of-life discussion.  She is electing for palliative care and hospice  Blood pressure 134/51 pulse 72-98 temperature 98 O2 sats 96%  Urine output none recorded for 04/01/2018  She received blood transfusion 03/31/2018  Sodium 141 potassium 4.5 chloride 109 CO2 27 BUN 34 creatinine 2.27 glucose 88 calcium 10.2 albumin 4.8 phosphorus 3.5 liver enzymes within normal range bilirubin 1.8 WBC 2.2 hemoglobin 9.0 platelets of 53   Medications Midrin 10 mg 3 times daily, BuSpar 10 mg 3 times daily, Celexa 20 mg daily, octreotide 150 mg subcu every 8 hours, famotidine 40 mg daily, albumin 25% 50 g every 12 hours, gabapentin 200 mg nightly, primidone 50 mg twice daily, Tums 400 mg twice daily  Objective:  Vital signs in last 24 hours:  Temp:  [98 F (36.7 C)-98.7 F (37.1 C)] 98 F (36.7 C) (03/11 0630) Pulse Rate:  [58-72] 72 (03/11 0630) Resp:  [15-18] 15 (03/11 0630) BP: (134-156)/(51-58) 134/51 (03/11 0630) SpO2:  [96 %-100 %] 96 % (03/11 0630) Weight:  [67.4 kg] 67.4 kg (03/11 0646)  Weight change: -0.8 kg Filed Weights   03/31/18 0450 04/01/18 0604 04/02/18 0646  Weight: 68.3 kg 68.2 kg 67.4 kg    Intake/Output: I/O last 3 completed shifts: In: 680 [P.O.:480; IV Piggyback:200] Out: 5 [Urine:5]   Intake/Output this shift:  No intake/output data recorded. Chronically ill  requiring oxygen CVS- RRR no murmurs rubs or gallops RS- CTA no wheezes or rales ABD- BS present soft non-distended EXT- no edema   Basic Metabolic Panel: Recent Labs  Lab 03/28/18 2130 03/30/18 0550 03/31/18 0208 04/01/18 0322 04/02/18 0330  NA 133* 135 134* 138 141  K 5.1 4.3 4.9 4.6 4.5  CL 103 107 107 107 109  CO2 23 24 22 24 27   GLUCOSE 90 79 132* 105* 88  BUN 45* 44* 38* 33* 34*  CREATININE 2.32* 2.06* 1.94* 1.96* 2.27*  CALCIUM 8.8* 8.8* 9.0 9.8 10.2  PHOS  --   --   --   --  3.5    Liver Function Tests: Recent Labs  Lab 03/26/18 2000 03/28/18 2130 03/30/18 0550 04/01/18 0322 04/02/18 0330  AST 43* 43* 30 28 24   ALT 32 31 24 18 16   ALKPHOS 82 87 61 34* 41  BILITOT 1.1 1.2 1.3* 2.0* 1.8*  PROT 6.0* 5.9* 5.5* 6.3* 6.3*  ALBUMIN 2.6* 2.3* 3.0* 5.0 4.8   No results for input(s): LIPASE, AMYLASE in the last 168 hours. Recent Labs  Lab 03/26/18 2000  AMMONIA 28    CBC: Recent Labs  Lab 03/28/18 2130 03/30/18 0550 03/31/18 0208 04/01/18 0322 04/02/18 0330  WBC 4.9 2.4* 1.9* 1.9* 2.2*  HGB 9.1* 7.4* 6.9* 7.9* 9.0*  HCT 27.3* 22.3* 21.2* 24.1* 27.8*  MCV 101.5* 102.3* 101.9* 101.3* 101.1*  PLT 81* 54* 49* 48* 53*    Cardiac Enzymes: No results for input(s): CKTOTAL, CKMB, CKMBINDEX, TROPONINI  in the last 168 hours.  BNP: Invalid input(s): POCBNP  CBG: No results for input(s): GLUCAP in the last 168 hours.  Microbiology: Results for orders placed or performed during the hospital encounter of 03/14/18  Body fluid culture     Status: None   Collection Time: 03/14/18  3:50 PM  Result Value Ref Range Status   Specimen Description   Final    PLEURAL Performed at [facility], [address], [place], [place] [address]    Special Requests   Final    NONE Performed at [facility], [place], [place] [address]    Gram Stain NO WBC SEEN NO ORGANISMS SEEN   Final   Culture   Final    NO GROWTH 3  DAYS Performed at [facility], [place] [address] [place], [place] [address]    Report Status 03/18/2018 FINAL  Final  Body fluid culture     Status: None   Collection Time: 03/19/18  3:50 PM  Result Value Ref Range Status   Specimen Description   Final    PLEURAL Performed at [facility], [address] [place], [place] [address]    Special Requests   Final    NONE Performed at [facility], [address] [place], [place] [address]    Gram Stain   Final    FEW WBC PRESENT, PREDOMINANTLY MONONUCLEAR NO ORGANISMS SEEN    Culture   Final    NO GROWTH 3 DAYS Performed at [facility], [place] [address] [place], [place] [address]    Report Status 03/23/2018 FINAL  Final  Body fluid culture     Status: None   Collection Time: 03/19/18  4:00 PM  Result Value Ref Range Status   Specimen Description   Final    PERITONEAL Performed at [facility], [address], [place], [place] [address]    Special Requests   Final    NONE Performed at [facility], [place] [place], [place] [address]    Gram Stain   Final    RARE WBC PRESENT, PREDOMINANTLY MONONUCLEAR NO ORGANISMS SEEN    Culture   Final    NO GROWTH 3 DAYS Performed at [facility], [place] [address], [place], [place] [address]    Report Status 03/22/2018 FINAL  Final  Acid Fast Smear (AFB)     Status: None (Preliminary result)   Collection Time: 03/24/18  1:22 PM  Result Value Ref Range Status   AFB Specimen Processing Concentration  Final   Acid Fast Smear Negative  Final    Comment: (Mclean) Performed At: [facility] [place], [place] [id number] Rush Farmer MD [id number]    Source (AFB) PENDING  Incomplete    Coagulation Studies: Recent Labs    04/02/18 0330  LABPROT 19.4*  INR 1.7*    Urinalysis: No results for input(s): COLORURINE, LABSPEC, PHURINE, GLUCOSEU, HGBUR, BILIRUBINUR, KETONESUR, PROTEINUR,  UROBILINOGEN, NITRITE, LEUKOCYTESUR in the last 72 hours.  Invalid input(s): APPERANCEUR    Imaging: Dg Chest 2 View  Result Date: 04/02/2018 CLINICAL DATA:  Shortness of breath EXAM: CHEST - 2 VIEW COMPARISON:  Three days ago FINDINGS: Large and significantly increased left pleural effusion. Borderline cardiopericardial enlargement, distorted by pleural fluid. Streaky density at the medial right base also seen previously. IMPRESSION: Large left pleural effusion with significant re-accumulation since 3 days ago. Electronically Signed   By: Monte Fantasia  M.D.   On: 04/02/2018 09:39     Medications:    . sodium chloride   Intravenous Once  . busPIRone  10 mg Oral TID  . calcium carbonate  2 tablet Oral BID  . citalopram  20 mg Oral Daily  . docusate  100 mg Oral BID  . gabapentin  200 mg Oral QHS  . midodrine  10 mg Oral TID WC  . octreotide  150 mcg Subcutaneous Q8H   albuterol, ondansetron **OR** ondansetron (ZOFRAN) IV  Assessment/ Plan:   Acute kidney injury on chronic kidney disease with possible consideration of hepatorenal syndrome she is currently being treated with Midrin 10 mg 3 times daily, octreotide 150 mcg every 8 hours and albumin.  Urine sodium less than 10.  Urinalysis did reveal 11-20 RBCs.  No protein renal ultrasound revealed 2 small cysts in the right kidney no hydronephrosis and nodular cirrhotic liver and a prominent spleen.  Positive for ascites .  Her creatinine appears to have stooled about 1.9.  Prognosis poor discussed with patient will plan palliative care and hospice  Hypertension volume appears adequately controlled does not appear to be any ascites at this particular time.  Recurrent left-sided pleural effusion with hepatic hydrothorax required repeated thoracenteses TIPS thought to be not in the patient's interest as risks outweigh benefits  Decompensated liver cirrhosis possible hepatorenal syndrome  Pancytopenia discussed with the primary service  they feel her pancytopenia is secondary to underlying liver disease and splenomegaly.  We will add back primidone and famotidine  Palliative care reconfirmed DNR not a dialysis candidate.  Confirmed again with patient that she would like palliative care hospice.  Thank you very much for this very interesting consult please reconsult if needed   LOS: [place] @TODAY @10 :36 AM

## 2018-04-02 NOTE — Progress Notes (Signed)
Daily Progress Note   Patient Name: Kelly Mclean       Date: 04/02/2018 DOB: Nov 11, 1955  Age: 63 y.o. MRN#: [id number] Attending Physician: Jonetta Osgood, MD Primary Care Physician: Marnee Guarneri, MD Admit Date: 03/28/2018  Reason for Consultation/Follow-up: Establishing goals of care  Subjective: Patient asleep- awakes easily. Tells me, "they want me to go home with Hospice". We discussed what this means to her. She states that "after all she's been through" she doesn't feel she would do well with the TIPS procedure. Her main goal is to be home and spend as much time as possible with her husband Kelly Mclean.  Discussed how Hospice could help meet that goal. Answered her questions about her disease process.  Patient shared details of childhood trauma from family that she is processing. She is considering who she would want to contact and let them know of her impending death.  Objective:  Awake, alert. No asterixis or jaundice. Abdomen distended. Respiration effort normal. Oriented x3. Judgement and thought content intact. Able to interact and participate in [place] and able to make decisions regarding her care.   Symptoms: She complains of nausea at times that worsens when the fluid builds up more in her lungs. She is interested in placement of pleurex catheter. This is relieved with current ondansetron. She would like her appetite to improve (notes eating only one meal a day, has a lack of enjoyment of food) and she would like to have a little more energy. Denies depression, anxiety or pain. Has SOB that worsens with increased pleural effusions. We discussed using low dose opioids. She is very resistant to this due to her history of addiction and how hard she worked to become sober. She says she would  like to "save those for a last resort".       Vital Signs: BP (!) 134/51 (BP Location: Left Arm)   Pulse 72   Temp 98 F (36.7 C) (Oral)   Resp 15   Ht 5' 2"  (1.575 m)   Wt 67.4 kg   SpO2 96%   BMI 27.18 kg/m  SpO2: SpO2: 96 % O2 Device: O2 Device: Nasal Cannula O2 Flow Rate: O2 Flow Rate (L/min): 2 L/min        Palliative Assessment/Data: PPS: 40%       Palliative Care  Assessment & Plan   Patient Profile: 63 y.o. female  with past medical history of HTN, cirrhosis of the liver (w/ recurrent pleural effusions), cervical dystonia, PTSD, anemia admitted on 03/28/2018 from [facility] for TIPS procedure due hepatic hydrothorax. Procedure has been deferred awaiting MELD score to be lower than 20. She had received diuresis earlier and developed acute on chronic renal failure, suspicious for hepatorenal syndrome, however, is responding now to albumin and IV fluid resuscitation and Cr is trending down.  Assessment/Recommendations/Plan   Noted Cr up today- TIPS unlikely with MELD score not going to be less than 20. Prognosis poor- not candidate for dialysis, patient not interested in transplant  Pt now with [place] to go home with Hospice support  Symptom management-   Fatigue/appetite- patient would likely benefit from low dose dexamethasone daily for fatigue and appetite stimulation- would recommend start with 14m in divided doses- 12mwith breakfast, 4m87mith lunch. Will follow up and discuss this with patient.   SOB- uniterested in opioids- would definitely benefit from pleurex cath to reduce return hospital and outpatient visits for recurrent pleural effusions  Goals of Care and Additional Recommendations:  Limitations on Scope of Treatment: Avoid Hospitalization and Minimize Medications  Code Status:  DNR  Prognosis:   < 6 months due to hepatic failure with hepatic hydrothorax- not a candidate for TIPS, now with progressing renal failure, hepatorenal failure, patient desires  to d/c home with Hospice  Discharge Planning:  Home with Hospice  Care plan was discussed with patient- attempted to call her husband- left message.  Thank you for allowing the Palliative Medicine Team to assist in the care of this patient.   Time In: 1330 Time Out: 1430 Total Time 60 mins Prolonged Time Billed yes      Greater than 50%  of this time was spent counseling and coordinating care related to the above assessment and plan.  KasMariana KaufmanGNP-C Palliative Medicine   Please contact Palliative Medicine Team phone at [phone] questions and concerns.

## 2018-04-02 NOTE — Progress Notes (Signed)
PT Cancellation Note  Patient Details Name: Kelly Mclean MRN: [id number] DOB: 19-Feb-1955   Cancelled Treatment:    Reason Eval/Treat Not Completed: Other (comment).  Cx x 2 as pt was in BR and then was getting a bath.  Will try again tomorrow.   Ramond Dial 04/02/2018, 3:16 PM  Mee Hives, PT MS Acute Rehab Dept. Number: [place]

## 2018-04-02 NOTE — Progress Notes (Signed)
PROGRESS NOTE        PATIENT DETAILS Name: Kelly Mclean Age: 63 y.o. Sex: female Date of Birth: 1955/12/19 Admit Date: 03/28/2018 Admitting Physician Etta Quill, DO QZE:SPQZ, Lemmie Evens, MD  Brief Narrative: Patient is a 63 y.o. 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 for consideration of TIPS procedure.  See below for further details  Subjective:  Not short of breath at rest-no chest pain.  Assessment/Plan: Recurrent left-sided pleural effusion secondary to hepatic hydrothorax: Required repeated thoracocentesis x 6 in the past 2 weeks.  Thought to have hepatic hydrothorax-IR consulted for TIPS procedure-but given high meld score-risks may be unacceptable.  Have consulted IR to see if we can put a Pleurx catheter instead (for comfort).    Pleural fluid cytology x3- for malignancy.  Decompensated liver cirrhosis: Secondary to NASH.  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  AKI on CKD stage III: Likely hepatorenal syndrome-creatinine has plateaued-given that it could be hepatorenal-high likelihood that renal function will not dramatically improve to lower the meld score any further.  Nephrology following-remains on octreotide, midodrine and IV albumin.   Anemia: Probably has anemia secondary to liver cirrhosis/CKD-worsened due to acute illness.  No evidence of overt blood loss-denies melena or hematochezia.  Has required 1 unit of PRBC transfusion so far-hemoglobin stable at 9.0.  Follow periodically.   Thrombocytopenia: Suspect related to liver cirrhosis/hypersplenism.  Follow.  Deconditioning: PT evaluation appreciated-home health services on discharge.  Depression/anxiety: Appears stable-continue BuSpar and Celexa.  Palliative care: Unfortunate 63 year old with [name] related liver  cirrhosis-now with hepatic hydrothorax and worsening renal function.  Poor prognosis-seen by palliative care-DNR in place-but patient and family wants to continue full scope of treatment otherwise.  Long discussion with patient today-complicated situation-she does not want aggressive care-she is agreeable to go home with hospice care-we will follow-up with IR today to see if a Pleurx catheter can be placed.  DVT Prophylaxis: SCD's  Code Status: DNR  Family Communication: None at bedside  Disposition Plan: Remain inpatient  Antimicrobial agents: Anti-infectives (From admission, onward)   None      Procedures: None  CONSULTS:  IR  Time spent: 25 minutes-Greater than 50% of this time was spent in counseling, explanation of diagnosis, planning of further management, and coordination of care.  MEDICATIONS: Scheduled Meds:  sodium chloride   Intravenous Once   busPIRone  10 mg Oral TID   calcium carbonate  2 tablet Oral BID   citalopram  20 mg Oral Daily   docusate  100 mg Oral BID   famotidine  10 mg Oral QHS   gabapentin  200 mg Oral QHS   midodrine  10 mg Oral TID WC   octreotide  150 mcg Subcutaneous Q8H   primidone  50 mg Oral BID   Continuous Infusions:  PRN Meds:.albuterol, ondansetron **OR** ondansetron (ZOFRAN) IV   PHYSICAL EXAM: Vital signs: Vitals:   04/01/18 2200 04/02/18 0630 04/02/18 0646 04/02/18 1500  BP: (!) 134/58 (!) 134/51  (!) 143/53  Pulse: (!) 58 72  74  Resp: 16 15    Temp: 98.7 F (37.1 C) 98 F (36.7 C)  98.3 F (36.8 C)  TempSrc: Oral Oral  Oral  SpO2: 98% 96%  100%  Weight:   67.4 kg  Height:       Filed Weights   03/31/18 0450 04/01/18 0604 04/02/18 0646  Weight: 68.3 kg 68.2 kg 67.4 kg   Body mass index is 27.18 kg/m.   General appearance:Awake, alert, not in any distress.  Eyes:no scleral icterus. HEENT: Atraumatic and Normocephalic Neck: supple, no JVD. Resp: Decreased air entry in the left-no added sounds  heard CVS: S1 S2 regular, no murmurs.  GI: Bowel sounds present, Non tender and not distended with no gaurding, rigidity or rebound. Extremities: B/L Lower Ext shows no edema, both legs are warm to touch Neurology:  Non focal Psychiatric: Normal judgment and insight. Normal mood. Musculoskeletal:No digital cyanosis Skin:No Rash, warm and dry Wounds:N/A  I have personally reviewed following labs and imaging studies  LABORATORY DATA: CBC: Recent Labs  Lab 03/28/18 2130 03/30/18 0550 03/31/18 0208 04/01/18 0322 04/02/18 0330  WBC 4.9 2.4* 1.9* 1.9* 2.2*  HGB 9.1* 7.4* 6.9* 7.9* 9.0*  HCT 27.3* 22.3* 21.2* 24.1* 27.8*  MCV 101.5* 102.3* 101.9* 101.3* 101.1*  PLT 81* 54* 49* 48* 53*    Basic Metabolic Panel: Recent Labs  Lab 03/28/18 2130 03/30/18 0550 03/31/18 0208 04/01/18 0322 04/02/18 0330  NA 133* 135 134* 138 141  K 5.1 4.3 4.9 4.6 4.5  CL 103 107 107 107 109  CO2 23 24 22 24 27   GLUCOSE 90 79 132* 105* 88  BUN 45* 44* 38* 33* 34*  CREATININE 2.32* 2.06* 1.94* 1.96* 2.27*  CALCIUM 8.8* 8.8* 9.0 9.8 10.2  PHOS  --   --   --   --  3.5    GFR: Estimated Creatinine Clearance: 23.4 mL/min (A) (by C-G formula based on SCr of 2.27 mg/dL (H)).  Liver Function Tests: Recent Labs  Lab 03/26/18 2000 03/28/18 2130 03/30/18 0550 04/01/18 0322 04/02/18 0330  AST 43* 43* 30 28 24   ALT 32 31 24 18 16   ALKPHOS 82 87 61 34* 41  BILITOT 1.1 1.2 1.3* 2.0* 1.8*  PROT 6.0* 5.9* 5.5* 6.3* 6.3*  ALBUMIN 2.6* 2.3* 3.0* 5.0 4.8   No results for input(s): LIPASE, AMYLASE in the last 168 hours. Recent Labs  Lab 03/26/18 2000  AMMONIA 28    Coagulation Profile: Recent Labs  Lab 03/26/18 2000 04/02/18 0330  INR 1.4* 1.7*    Cardiac Enzymes: No results for input(s): CKTOTAL, CKMB, CKMBINDEX, TROPONINI in the last 168 hours.  BNP (last 3 results) No results for input(s): PROBNP in the last 8760 hours.  HbA1C: No results for input(s): HGBA1C in the last 72  hours.  CBG: No results for input(s): GLUCAP in the last 168 hours.  Lipid Profile: No results for input(s): CHOL, HDL, LDLCALC, TRIG, CHOLHDL, LDLDIRECT in the last 72 hours.  Thyroid Function Tests: No results for input(s): TSH, T4TOTAL, FREET4, T3FREE, THYROIDAB in the last 72 hours.  Anemia Panel: No results for input(s): VITAMINB12, FOLATE, FERRITIN, TIBC, IRON, RETICCTPCT in the last 72 hours.  Urine analysis:    Component Value Date/Time   COLORURINE YELLOW 03/29/2018 2058   APPEARANCEUR CLEAR 03/29/2018 2058   LABSPEC 1.012 03/29/2018 2058   PHURINE 5.0 03/29/2018 2058   GLUCOSEU NEGATIVE 03/29/2018 2058   HGBUR LARGE (A) 03/29/2018 2058   BILIRUBINUR NEGATIVE 03/29/2018 2058   KETONESUR NEGATIVE 03/29/2018 2058   PROTEINUR NEGATIVE 03/29/2018 2058   NITRITE NEGATIVE 03/29/2018 2058   LEUKOCYTESUR TRACE (A) 03/29/2018 2058    Sepsis Labs: Lactic Acid, Venous No results found for: LATICACIDVEN  MICROBIOLOGY: Recent Results (from  the past 240 hour(s))  Acid Fast Smear (AFB)     Status: None (Preliminary result)   Collection Time: 03/24/18  1:22 PM  Result Value Ref Range Status   AFB Specimen Processing Concentration  Final   Acid Fast Smear Negative  Final    Comment: (NOTE) Performed At: [facility] [place], [place] [id number] Rush Farmer MD JQ:[id number]    Source (AFB) PENDING  Incomplete    RADIOLOGY STUDIES/RESULTS: Ct Abdomen Pelvis Wo Contrast  Result Date: 03/20/2018 CLINICAL DATA:  History of cirrhosis of liver, dyspnea. Hepatic hydrothorax. Fluid with lymphocytes requiring rule out lymphoma. EXAM: CT ABDOMEN AND PELVIS WITHOUT CONTRAST TECHNIQUE: Multidetector CT imaging of the abdomen and pelvis was performed following the standard protocol without IV contrast. COMPARISON:  CT abdomen dated 03/06/2018 FINDINGS: Lower chest: Again noted is a large LEFT pleural effusion, incompletely imaged. Lingular consolidation is  likely associated atelectasis. Hepatobiliary: Cirrhotic appearing liver. Gallstones are present within the nondistended gallbladder. No bile duct dilatation seen. Pancreas: Unremarkable. No pancreatic ductal dilatation or surrounding inflammatory changes. Spleen: Normal in size without focal abnormality. Adrenals/Urinary Tract: Kidneys are unremarkable without mass, stone or hydronephrosis. No perinephric fluid. No ureteral or bladder calculi identified. Bladder is obscured by overlying ascites. Stomach/Bowel: No dilated large or small bowel loops. 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 Appendix is normal. Stomach is unremarkable. Vascular/Lymphatic: Aortic atherosclerosis. Enlarged lymph node adjacent to the pancreatic head, 1.6 cm, better seen on the recent contrast-enhanced CT of 03/06/2018. No other enlarged lymph nodes appreciated in the abdomen or pelvis. Reproductive: Uterus appears normal. Ascites obscures visualization of the bilateral adnexa. Other: Moderate amount of ascites within the abdomen and pelvis, largest component within the pelvis. No evidence of circumscribed fluid collection or abscess. No free intraperitoneal air. Musculoskeletal: No acute or suspicious osseous finding. Superficial soft tissues are unremarkable. IMPRESSION: 1. Cirrhotic liver. 2. Moderate amount of ascites within the abdomen and pelvis, largest component within the pelvis. 3. Enlarged lymph node adjacent to the pancreatic head, measuring 1.6 cm, better seen on the recent contrast-enhanced CT of 03/06/2016, favor reactive over neoplastic etiology given the absence of additional lymphadenopathy. 4. Large LEFT pleural effusion, incompletely imaged. 5. Cholelithiasis without evidence of acute cholecystitis. Aortic Atherosclerosis (ICD10-I70.0). Electronically Signed   By: Franki Cabot M.D.   On: 03/20/2018 14:20   Dg Chest 1 View  Result Date: 03/30/2018 CLINICAL DATA:  Status post thoracentesis. EXAM: CHEST  1 VIEW  COMPARISON:  03/27/2017 FINDINGS: Midline trachea. Normal heart size. Small right pleural effusion is suspected. The left-sided effusion has decreased. No pneumothorax. Improved aeration with decreased left-sided atelectasis. Mild subsegmental atelectasis remains at both lung bases. IMPRESSION: Decreased left pleural effusion, without pneumothorax. Improved aeration with mild bibasilar atelectasis remaining. Electronically Signed   By: Abigail Miyamoto M.D.   On: 03/30/2018 13:47   Dg Chest 1 View  Result Date: 03/26/2018 CLINICAL DATA:  Shortness of breath. EXAM: CHEST  1 VIEW COMPARISON:  Multiple prior exams most recent radiograph 03/24/2018. Most recent CT 03/14/2018 FINDINGS: Known left pleural effusion has increased in size over the past 2 days, now large in size. Associated compressive atelectasis throughout the left lung. Mild rightward mediastinal shift versus rotation. Only a small portion of aerated left perihilar lung. Hypoventilatory right lung without acute finding. No visualized pneumothorax. IMPRESSION: Increased size of known left pleural effusion, now large in size. This causes mild mass effect in the mediastinum with rightward mediastinal shift. Electronically Signed   By: Threasa Beards  Sanford M.D.   On: 03/26/2018 19:37   Dg Chest 1 View  Result Date: 03/14/2018 CLINICAL DATA:  Status post thoracentesis EXAM: CHEST  1 VIEW COMPARISON:  March 14, 2018 12:56 p.m. FINDINGS: The heart size and mediastinal contours are within normal limits. There is small left pleural effusion significantly decreased compared prior exam. Consolidation left lung base is noted. There is no pneumothorax. The right lung is clear. The visualized skeletal structures are stable. IMPRESSION: There is small left pleural effusion, significantly decreased compared prior exam. Consolidation of left lung base is identified. There is no pneumothorax. Electronically Signed   By: Abelardo Diesel M.D.   On: 03/14/2018 16:19   Dg  Chest 2 View  Result Date: 04/02/2018 CLINICAL DATA:  Shortness of breath EXAM: CHEST - 2 VIEW COMPARISON:  Three days ago FINDINGS: Large and significantly increased left pleural effusion. Borderline cardiopericardial enlargement, distorted by pleural fluid. Streaky density at the medial right base also seen previously. IMPRESSION: Large left pleural effusion with significant re-accumulation since 3 days ago. Electronically Signed   By: Monte Fantasia M.D.   On: 04/02/2018 09:39   Ct Chest Wo Contrast  Result Date: 03/14/2018 CLINICAL DATA:  Pleural effusion and dyspnea since yesterday. EXAM: CT CHEST WITHOUT CONTRAST TECHNIQUE: Multidetector CT imaging of the chest was performed following the standard protocol without IV contrast. COMPARISON:  03/14/2018 CXR FINDINGS: Cardiovascular: Common arterial branch of the right brachiocephalic left common carotid arteries. Atherosclerosis of left subclavian artery origin. Nonaneurysmal atherosclerotic aorta. The unenhanced pulmonary vessels are unremarkable. The heart size is normal with small anterior pericardial effusion without thickening noted. Mediastinum/Nodes: Small subcentimeter prevascular and paratracheal lymph nodes. 1 cm short axis subcarinal lymph node. Patent trachea and mainstem bronchi. Unremarkable CT appearance of the esophagus. The thyroid gland is unremarkable without dominant mass. Lungs/Pleura: Moderate to large layering left effusion with adjacent atelectasis. The right lung is clear. No pneumothorax or dominant mass. Upper Abdomen: Cirrhotic appearance of the liver with moderate to large volume of ascites. Musculoskeletal: No chest wall mass or suspicious bone lesions identified. Mild thoracic spondylosis. IMPRESSION: 1. Moderate to large layering left pleural effusion with adjacent atelectasis. 2. Cirrhotic liver with moderate to large volume of ascites. Aortic Atherosclerosis (ICD10-I70.0). Electronically Signed   By: Ashley Royalty M.D.   On:  03/14/2018 22:17   US Abdomen Complete  Result Date: 03/04/2018 CLINICAL DATA:  63 year old with hepatic cirrhosis. EXAM: ABDOMEN ULTRASOUND COMPLETE COMPARISON:  Visualized upper abdomen on CT chest 02/18/2018. FINDINGS: Gallbladder: Numerous shadowing gallstones, the largest measuring approximately 9 mm. Echogenic sludge. Mild gallbladder wall thickening up to approximately 4 mm. No pericholecystic fluid. Negative sonographic Murphy's sign according to the ultrasound technologist. Common bile duct: Diameter: Approximately 4 mm. No visible bile duct stones. Liver: Diffusely coarsened echotexture and irregular hepatic contour. No focal hepatic parenchymal abnormalities. Mildly increased echotexture diffusely. Portal vein is patent on color Doppler imaging with normal direction of blood flow towards the liver. IVC: Patent. Pancreas: Hypoechoic mass adjacent to or involving the pancreatic head or porta hepatis lymph node measuring approximately 2.4 x 1.4 x 2.8 cm. Visualized pancreas otherwise unremarkable. The tail is obscured by overlying bowel gas. The pancreas was not included on the recent CT chest. Spleen: Enlarged, measuring approximately 15.8 x 15.8 x 5.2 cm, giving a volume of approximately 681 mL. No focal parenchymal abnormality. Right Kidney: Length: Approximately 9.0 cm. No hydronephrosis. Well-preserved cortex. Mildly echogenic parenchyma. Approximate 1.2 x 1.1 x 2.0 cm mildly complex cyst with  a thin internal septation involving the UPPER pole of the RIGHT kidney. No solid renal mass. Left Kidney: Length: Approximately 8.2 cm. No hydronephrosis. Well-preserved cortex. Mildly echogenic parenchyma. No focal parenchymal abnormalities. Abdominal aorta: Normal in caliber throughout its visualized course in the abdomen with evidence of atherosclerosis. Maximum diameter 2.4 cm. Other findings: Small amount of ascites. Large LEFT pleural effusion. IMPRESSION: 1. Hepatic cirrhosis.  No focal hepatic  parenchymal abnormality. 2. Hypoechoic mass adjacent to or involving the pancreatic head versus enlarged porta hepatis lymph node. The pancreas was not included on the recent CT chest, so a CT of the abdomen and pelvis with contrast is recommended in further evaluation. 3. Cholelithiasis.  No sonographic evidence of acute cholecystitis. 4. Splenomegaly without focal splenic parenchymal abnormality, indicating portal hypertension. 5. Patent portal vein with normal antegrade hepatopetal flow. 6. Small amount of ascites. 7. Large LEFT pleural effusion. Electronically Signed   By: Evangeline Dakin M.D.   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 Plan for nuclear medicine ascites - hydrothorax examination. Plan for an ultrasound-guided paracentesis and injection of radiopharmaceutical into the ascites. Left thoracentesis was performed prior to this procedure. EXAM: ULTRASOUND GUIDED PARACENTESIS INJECTION OF RADIOPHARMACEUTICAL INTO ASCITES MEDICATIONS: None. COMPLICATIONS: None immediate. PROCEDURE: Informed written consent was obtained from the patient after a discussion of the risks, benefits and alternatives to treatment. A timeout was performed prior to the initiation of the procedure. Initial ultrasound scanning demonstrates a small amount of ascites within the right upper abdominal quadrant. The right upper abdomen was prepped and draped in the usual sterile fashion. 1% lidocaine with was used for local anesthesia. Following this, a 6 Fr Safe-T-Centesis catheter was introduced. An ultrasound image was saved for documentation purposes. The paracentesis was performed. 60 mL of opaque yellow fluid was removed. The radiopharmaceutical was injected through the Safe-T-Centesis catheter. Catheter was flushed with sterile saline. The catheter was removed and a dressing was applied. The patient tolerated the procedure well  without immediate post procedural complication. FINDINGS: Small amount of fluid around the liver. Small amount of fluid in the left lower quadrant. 60 mL of fluid was removed from the right upper quadrant. The radiopharmaceutical was injected into the right upper quadrant. IMPRESSION: Successful ultrasound-guided paracentesis yielding 60 mL of ascites. Fluid was sent for labs. Radiopharmaceutical was injected through the paracentesis catheter for the nuclear medicine examination. Electronically Signed   By: Markus Daft M.D.   On: 03/19/2018 17:06   US Renal  Result Date: 03/30/2018 CLINICAL DATA:  Renal failure.  Cirrhosis. EXAM: RENAL / URINARY TRACT ULTRASOUND COMPLETE COMPARISON:  CT scan March 20, 2018 FINDINGS: Right Kidney: Renal measurements: 9 x 4.6 x 5.3 cm = volume: 114 mL. Contain in upper pole levin mm complex cysts. Contains a 7 mm lower pole cyst. Increased cortical echogenicity. Left Kidney: Renal measurements: 7.9 x 3.8 x 4.7 cm = volume: 73 mL. Increased cortical echogenicity. Bladder: Appears normal for degree of bladder distention. IMPRESSION: 1. Two small cysts in the right kidney, 1 of which is mildly complex. Medical renal disease with increased cortical echogenicity. No hydronephrosis. 2. Nodular liver consistent with cirrhosis.  Ascites. 3. The spleen is prominent measuring up to 13.2 cm. However, this volume of 357 cc does not meet the criteria for splenomegaly. Electronically Signed   By: Dorise Bullion III M.D   On: 03/30/2018 16:13   Nm Interstitial Rad Source Applic Complex  Result  Date: 03/19/2018 CLINICAL DATA:  63 year old with cirrhosis and recurrent left hydrothorax. Patient is being evaluated for a hepatic hydrothorax. EXAM: NUCLEAR MEDICINE SULFUR COLLOID PERITONEAL SCINTIGRAPHY TECHNIQUE: Ultrasound-guided left thoracentesis was performed. 1.1 L of left pleural fluid was removed but not all of the fluid was removed. Subsequently, an ultrasound-guided paracentesis was  performed in the right upper quadrant. 60 mL of peritoneal fluid was removed. Technetium 99 M sulfur colloid was injected through the paracentesis catheter. Paracentesis catheter was removed. RADIOPHARMACEUTICALS:  7 millicuries technetium 99 M sulfur colloid COMPARISON:  Chest CT 03/14/2018 FINDINGS: Imaging was obtained over 1 hour. The initial images demonstrated radiopharmaceutical throughout the peritoneal cavity. The largest peritoneal pockets are in the right upper quadrant and left lower quadrant. Over time, radiopharmaceutical starts to accumulate in the left hemithorax. Findings are compatible with a trans diaphragmatic connection between the peritoneal cavity and the left pleural space. IMPRESSION: Study is positive for connection between the peritoneal space and left pleural space. 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 Electronically Signed   By: Markus Daft M.D.   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 Plan for nuclear medicine ascites - hydrothorax examination. Plan for an ultrasound-guided paracentesis and injection of radiopharmaceutical into the ascites. Left thoracentesis was performed prior to this procedure. EXAM: ULTRASOUND GUIDED PARACENTESIS INJECTION OF RADIOPHARMACEUTICAL INTO ASCITES MEDICATIONS: None. COMPLICATIONS: None immediate. PROCEDURE: Informed written consent was obtained from the patient after a discussion of the risks, benefits and alternatives to treatment. A timeout was performed prior to the initiation of the procedure. Initial ultrasound scanning demonstrates a small amount of ascites within the right upper abdominal quadrant. The right upper abdomen was prepped and draped in the usual sterile fashion. 1% lidocaine with was used for local anesthesia. Following this, a 6 Fr Safe-T-Centesis catheter was introduced. An ultrasound image was saved for  documentation purposes. The paracentesis was performed. 60 mL of opaque yellow fluid was removed. The radiopharmaceutical was injected through the Safe-T-Centesis catheter. Catheter was flushed with sterile saline. The catheter was removed and a dressing was applied. The patient tolerated the procedure well without immediate post procedural complication. FINDINGS: Small amount of fluid around the liver. Small amount of fluid in the left lower quadrant. 60 mL of fluid was removed from the right upper quadrant. The radiopharmaceutical was injected into the right upper quadrant. IMPRESSION: Successful ultrasound-guided paracentesis yielding 60 mL of ascites. Fluid was sent for labs. Radiopharmaceutical was injected through the paracentesis catheter for the nuclear medicine examination. Electronically Signed   By: Markus Daft M.D.   On: 03/19/2018 17:06   Ct Abdomen W Wo Contrast  Result Date: 03/06/2018 CLINICAL DATA:  Pancreatic mass seen on earlier [place]. Pancreatic mass workup . Cirrhosis. Epigastric and LUQ pain. 82m of Omni 300 used. Pt could only tolerate laying on her left side for scan.^1068mOMNIPAQUE IOHEXOL 300 MG/ML SOLNPancreatic mass EXAM: CT ABDOMEN WITHOUT AND WITH CONTRAST TECHNIQUE: Multidetector CT imaging of the abdomen was performed following the standard protocol before and following the bolus administration of intravenous contrast. CONTRAST:  10084mMNIPAQUE IOHEXOL 300 MG/ML  SOLN COMPARISON:  Ultrasound 03/04/2010 FINDINGS: Lower chest:  Large LEFT pleural effusion with passive atelectasis. Hepatobiliary: Nodular liver. Caudate lobe is enlarged. Portal veins are patent. Multiple gallstones within lumen gallbladder. No enhancing hepatic lesion. No intrahepatic or extrahepatic biliary duct dilatation. Pancreas: No abnormality of the pancreatic head body or tail.  No duct dilatation. Periampullary duodenum diverticulum noted. Lymph node position inferior to the pancreas adjacent to the portal vein  measures 16 mm (image 49/4) Spleen: Spleen borderline enlarged. Adrenals/urinary tract: LEFT kidney is normal. RIGHT kidney normal. No adrenal abnormality Stomach/Bowel: Stomach and limited of the small bowel is unremarkable Vascular/Lymphatic: Abdominal aortic normal caliber. No retroperitoneal periportal lymphadenopathy. Musculoskeletal: No aggressive osseous lesion IMPRESSION: 1. No pancreatic lesion identified. 2. Peripancreatic lymph node is mildly enlarged. 3. Small periampullary duodenum diverticulum present. 4. Morphologic changes in liver consistent with cirrhosis. No biliary duct dilatation of the intrahepatic or extrahepatic bile ducts. 5. No enhancing hepatic lesion. 6. Cholelithiasis. 7. Moderate volume of intraperitoneal free fluid. 8. Large LEFT pleural effusion Electronically Signed   By: Suzy Bouchard M.D.   On: 03/06/2018 16:31   Dg Bone Density (dxa)  Result Date: 03/11/2018 EXAM: DUAL X-RAY ABSORPTIOMETRY (DXA) FOR BONE MINERAL DENSITY IMPRESSION: Dear Dr, Lutricia Feil, Your patient Saundra Gin completed a FRAX assessment on 03/11/2018 using the Harris (analysis version: 14.10) manufactured by [employer]. The following summarizes the results of our evaluation. PATIENT BIOGRAPHICAL: Name: Kikuye, Korenek Patient ID: [id number] Birth Date: 06/24/1955 Height:    62.5 in. Gender:     Female    [age]       Weight:    158.4 lbs. Ethnicity:  White                            Exam Date: 03/11/2018 FRAX* RESULTS:  (version: 3.5) 10-year Probability of Fracture1 Major Osteoporotic Fracture2 Hip Fracture 8.1% 0.7% Population: [place] (Caucasian) Risk Factors: None Based on Femur (Left) Neck BMD 1 -The 10-year probability of fracture may be lower than reported if the patient has received treatment. 2 -Major Osteoporotic Fracture: Clinical Spine, Forearm, Hip or Shoulder *FRAX is a Materials engineer of the [employer] for Metabolic Bone Disease, a  [place] (WHO) [facility]. ASSESSMENT: The probability of a major osteoporotic fracture is 8.1% within the next ten years. The probability of a hip fracture is 0.7% within the next ten years. . Technologist: SCE PATIENT BIOGRAPHICAL: Name: Mckinley, Adelstein Patient ID: [id number] Birth Date: 29-Sep-1955 Height: 62.5 in. Gender: Female Exam Date: 03/11/2018 Weight: 158.4 lbs. Indications: Asthma, Caucasian, Cirrhosis, Early Menopause, Postmenopausal, Previous Smoker Fractures: Treatments: Gabapentin, primidone ASSESSMENT: The BMD measured at Femur Neck Left is 0.833 g/cm2 with a T-score of -1.5. This patient is considered osteopenic according to [facility]) criteria. The quality of the scan is good. Site Region Measured Measured WHO Young Adult BMD Date       Age      Classification T-score AP Spine L1-L4 03/11/2018 [age] Osteopenia -1.5 1.007 g/cm2 DualFemur Neck Left 03/11/2018 63.0 Osteopenia -1.5 0.833 g/cm2 World Health Organization [facility]) criteria for post-menopausal, Caucasian Women: Normal:       T-score at or above -1 SD Osteopenia:   T-score between -1 and -2.5 SD Osteoporosis: T-score at or below -2.5 SD RECOMMENDATIONS: 1. All patients should optimize calcium and vitamin D intake. 2. Consider FDA-approved medical therapies in postmenopausal women and men aged 32 years and older, based on the following: a. A hip or vertebral(clinical or morphometric) fracture b. T-score < -2.5 at the femoral neck or spine after appropriate evaluation to exclude secondary causes c. Low bone mass (T-score between -1.0 and -2.5 at the femoral neck or spine) and  a 10-year probability of a hip fracture > 3% or a 10-year probability of a major osteoporosis-related fracture > 20% based on the [place]-adapted WHO algorithm d. Clinician judgment and/or patient preferences may indicate treatment for people with 10-year fracture probabilities above or below these levels FOLLOW-UP: People with  diagnosed cases of osteoporosis or at high risk for fracture should have regular bone mineral density tests. For patients eligible for Medicare, routine testing is allowed once every 2 years. The testing frequency can be increased to one year for patients who have rapidly progressing disease, those who are receiving or discontinuing medical therapy to restore bone mass, or have additional risk factors. I have reviewed this report, and agree with the above findings. [facility] Radiology Electronically Signed   By: Lowella Grip III M.D.   On: 03/11/2018 11:23   Dg Chest Port 1 View  Result Date: 03/28/2018 CLINICAL DATA:  Recurrent effusion EXAM: PORTABLE CHEST 1 VIEW COMPARISON:  03/27/2018, 03/26/2018, 03/24/2018 FINDINGS: Interval diffuse hazy opacity left thorax likely due to layering effusion, moderate in size. This is increased compared to prior. Underlying edema not excluded. Stable cardiomediastinal silhouette. Left basilar airspace disease. IMPRESSION: Increased moderate left pleural effusion with development of diffuse hazy opacity in the left thorax which may reflect layering fluid and or edema. Increased airspace disease at the left base. Electronically Signed   By: Donavan Foil M.D.   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 EXAM: PORTABLE CHEST 1 VIEW COMPARISON:  Chest radiograph-03/26/2018 FINDINGS: Grossly unchanged cardiac silhouette and mediastinal contours. Interval reduction in persistent moderate size left-sided effusion post thoracentesis. No pneumothorax. Improved aeration of the left mid and lower lung with persistent left basilar heterogeneous/consolidative opacities. No new focal airspace opacities. No definite right-sided pleural effusion. No evidence of edema. No acute osseous abnormalities. IMPRESSION: 1. Interval reduction in persistent moderate size  left-sided effusion post thoracentesis. No pneumothorax. 2. Improved aeration of the left lung base with persistent left basilar atelectasis. Electronically Signed   By: Sandi Mariscal M.D.   On: 03/27/2018 13:34   Dg Chest Port 1 View  Result Date: 03/24/2018 CLINICAL DATA:  Post left thoracentesis EXAM: PORTABLE CHEST 1 VIEW COMPARISON:  03/21/2018 FINDINGS: Moderate to large left pleural effusion minimally changed since prior study. No pneumothorax. Left basilar opacity, likely atelectasis. No confluent opacity on the right. Heart is upper limits normal in size. IMPRESSION: Moderate to large-sized left pleural effusion minimally changed. No pneumothorax. Continued left base atelectasis or infiltrate. Electronically Signed   By: Rolm Baptise M.D.   On: 03/24/2018 12:46   Dg Chest Port 1 View  Result Date: 03/19/2018 CLINICAL DATA:  Status post thoracentesis. EXAM: PORTABLE CHEST 1 VIEW COMPARISON:  03/14/2018 FINDINGS: 1702 hours. Moderate to large left pleural effusion noted without evidence for pneumothorax. Left base collapse/consolidation. The cardio pericardial silhouette is enlarged. The visualized bony structures of the thorax are intact. Telemetry leads overlie the chest. IMPRESSION: 1. No evidence for pneumothorax status post thoracentesis. 2. Moderate to large left pleural effusion. Electronically Signed   By: Misty Stanley M.D.   On: 03/19/2018 18:56   Dg Chest Portable 1 View  Result Date: 03/14/2018 CLINICAL DATA:  Shortness of breath. EXAM: PORTABLE CHEST 1 VIEW COMPARISON:  Chest x-ray 03/06/2018. FINDINGS: Mediastinum and hilar structures normal. Large left pleural effusion, increased in size from prior exam. Underlying left lung atelectasis/infiltrate can  not be excluded. No pneumothorax. Heart size most likely stable. IMPRESSION: Large left pleural effusion, increased from prior exam. Electronically Signed   By: Marcello Moores  Register   On: 03/14/2018 13:19   Dg Chest Port 1 View  Result  Date: 03/06/2018 CLINICAL DATA:  Post large volume left-sided thoracentesis. EXAM: PORTABLE CHEST 1 VIEW COMPARISON:  Chest radiograph-02/20/2018; CT abdomen pelvis-earlier same day FINDINGS: Interval reduction in persistent moderate size left-sided effusion post large volume thoracentesis. No pneumothorax. Improved aeration of left lung base with persistent left basilar heterogeneous/consolidative opacities. No definite evidence of right-sided pleural effusion. No new focal airspace opacities. Unchanged cardiac silhouette and mediastinal contours. No acute osseous abnormalities. IMPRESSION: Interval reduction in persistent moderate sized left-sided effusion post thoracentesis. No pneumothorax. Electronically Signed   By: Sandi Mariscal M.D.   On: 03/06/2018 16:02   Mm 3d Screen Breast Bilateral  Result Date: 03/12/2018 CLINICAL DATA:  Screening. EXAM: DIGITAL SCREENING BILATERAL MAMMOGRAM WITH TOMO AND CAD COMPARISON:  Previous exam(s). ACR Breast Density Category b: There are scattered areas of fibroglandular density. FINDINGS: There are no findings suspicious for malignancy. Images were processed with CAD. IMPRESSION: No mammographic evidence of malignancy. A result letter of this screening mammogram will be mailed directly to the patient. RECOMMENDATION: Screening mammogram in one year. (Code:SM-B-01Y) BI-RADS CATEGORY  1: Negative. Electronically Signed   By: Dorise Bullion III M.D   On: 03/12/2018 14:44   Nm Hepato Biliary Leak  Result Date: 03/17/2018 CLINICAL DATA:  Cholelithiasis. Nausea and vomiting. Hepatic cirrhosis. EXAM: NUCLEAR MEDICINE HEPATOBILIARY IMAGING TECHNIQUE: Sequential images of the abdomen were obtained out to 60 minutes following intravenous administration of radiopharmaceutical. RADIOPHARMACEUTICALS:  5.1 mCi Tc-29m Choletec IV COMPARISON:  CT on 03/14/2018 FINDINGS: Delayed uptake and biliary excretion of activity by the liver is seen, consistent with known hepatic cirrhosis.  Gallbladder activity is visualized, consistent with patency of cystic duct. Biliary activity passes into small bowel, consistent with patent common bile duct. IMPRESSION: No evidence of acute cholecystitis or biliary ductal dilatation. Hepatocellular dysfunction, consistent with hepatic cirrhosis. Electronically Signed   By: JEarle GellM.D.   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 Request for therapeutic thoracentesis today in IR. EXAM: ULTRASOUND GUIDED LEFT THORACENTESIS MEDICATIONS: 10 mL 1% lidocaine. COMPLICATIONS: None immediate. PROCEDURE: An ultrasound guided thoracentesis was thoroughly discussed with the patient and questions answered. The benefits, risks, alternatives and complications were also discussed. The patient understands and wishes to proceed with the procedure. Written consent was obtained. Ultrasound was performed to localize and mark an adequate pocket of fluid in the left chest. The area was then prepped and draped in the normal sterile fashion. 1% Lidocaine was used for local anesthesia. Under ultrasound guidance a 6 Fr Safe-T-Centesis catheter was introduced. Thoracentesis was performed. The catheter was removed and a dressing applied. FINDINGS: A total of approximately 2.1 L of chylous fluid was removed. IMPRESSION: Successful ultrasound guided left thoracentesis yielding 2.1 L of pleural fluid. Read by SCandiss Norse PA-C Electronically Signed   By: MJerilynn Mages  Shick M.D.   On: 03/30/2018 12:54   [place]oreaThoracentesis Asp Pleural Space W/img Guide  Result Date: 03/27/2018 INDICATION: History of left-sided hepatic hydrothorax now with recurrent symptomatic left-sided pleural effusion. Please perform ultrasound-guided thoracentesis for therapeutic purposes. EXAM: [place]THORACENTESIS ASP PLEURAL SPACE W/IMG GUIDE COMPARISON:  Chest  radiograph-03/26/2018 MEDICATIONS: None. COMPLICATIONS: None immediate.  TECHNIQUE: Informed written consent was obtained from the patient after a discussion of the risks, benefits and alternatives to treatment. A timeout was performed prior to the initiation of the procedure. Initial ultrasound scanning demonstrates a large minimally complex though predominantly anechoic left-sided pleural effusion. The lower chest was prepped and draped in the usual sterile fashion. 1% lidocaine was used for local anesthesia. An ultrasound image was saved for documentation purposes. An 8 Fr Safe-T-Centesis catheter was introduced. The thoracentesis was performed. The catheter was removed and a dressing was applied. The patient tolerated the procedure well without immediate post procedural complication. The patient was escorted to have an upright chest radiograph. FINDINGS: A total of approximately 2.2 liters of serous fluid was removed. IMPRESSION: Successful ultrasound-guided left sided thoracentesis yielding 2.2 liters of pleural fluid. Electronically Signed   By: Sandi Mariscal M.D.   On: 03/27/2018 13:37   US Thoracentesis Asp Pleural Space W/img Guide  Result Date: 03/24/2018 INDICATION: Patient with history of hepatic hydrothorax, cirrhosis and recurrent left pleural effusion who presents today originally for paracentesis in order to obtain flow cytometry to r/o malignancy prior to possible TIPS procedure. Limited abdominal US shows scant peritoneal fluid not amenable to percutaneous drainage. Diagnostic and therapeutic thoracentesis performed in order to obtain requested labs in lieu of paracentesis given patient's history of hepatic hydrothorax. EXAM: ULTRASOUND GUIDED LEFT THORACENTESIS MEDICATIONS: 20 mL 1% lidocaine. COMPLICATIONS: None immediate. PROCEDURE: An ultrasound guided thoracentesis was thoroughly discussed with the patient and questions answered. The benefits, risks, alternatives and complications were also  discussed. The patient understands and wishes to proceed with the procedure. Written consent was obtained. Ultrasound was performed to localize and mark an adequate pocket of fluid in the left chest. The area was then prepped and draped in the normal sterile fashion. 1% Lidocaine was used for local anesthesia. Under ultrasound guidance a 6 Fr Safe-T-Centesis catheter was introduced. Thoracentesis was performed. The catheter was removed and a dressing applied. FINDINGS: A total of approximately 1.1 L of pink milky fluid was removed. Samples were sent to the laboratory as requested by the clinical team. IMPRESSION: Successful ultrasound guided left thoracentesis yielding 1.1 L of pleural fluid. Read by Candiss Norse, PA-C Electronically Signed   By: Lucrezia Europe M.D.   On: 03/24/2018 12:49   US Thoracentesis Asp Pleural Space W/img Guide  Result Date: 03/19/2018 INDICATION: 63 year old with cirrhosis and recurrent left hydrothorax. Patient is being evaluated for a connection between the peritoneal and left pleural space with a nuclear medicine examination. Patient has a large amount of left pleural fluid on ultrasound examination. Plan to remove some of the left pleural fluid prior to the nuclear medicine examination. EXAM: ULTRASOUND GUIDED LEFT THORACENTESIS MEDICATIONS: None. COMPLICATIONS: None immediate. PROCEDURE: An ultrasound guided thoracentesis was thoroughly discussed with the patient and questions answered. The benefits, risks, alternatives and complications were also discussed. The patient understands and wishes to proceed with the procedure. Written consent was obtained. Ultrasound was performed to localize and mark an adequate pocket of fluid in the left chest. The area was then prepped and draped in the normal sterile fashion. 1% Lidocaine was used for local anesthesia. Under ultrasound guidance a 6 Fr Safe-T-Centesis catheter was introduced. Thoracentesis was performed. The catheter was removed  and a dressing applied. FINDINGS: A total of approximately 1.1 L of opaque yellow fluid was removed. Samples were sent to the laboratory as requested by the clinical team. IMPRESSION: Successful ultrasound guided left thoracentesis yielding 1.1 L of pleural  fluid. Electronically Signed   By: Markus Daft M.D.   On: 03/19/2018 17:11   US Thoracentesis Asp Pleural Space W/img Guide  Result Date: 03/14/2018 CLINICAL DATA:  Recurrent left pleural effusion. EXAM: ULTRASOUND GUIDED LEFT THORACENTESIS COMPARISON:  None. PROCEDURE: An ultrasound guided thoracentesis was thoroughly discussed with the patient and questions answered. The benefits, risks, alternatives and complications were also discussed. The patient understands and wishes to proceed with the procedure. Written consent was obtained. Ultrasound was performed to localize and mark an adequate pocket of fluid in the left chest. The area was then prepped and draped in the normal sterile fashion. 1% Lidocaine was used for local anesthesia. Under ultrasound guidance a 6 French Safe-T-Centesis catheter was introduced. Thoracentesis was performed. The catheter was removed and a dressing applied. COMPLICATIONS: None FINDINGS: A total of approximately 2.3 L of cloudy, yellowish-brown fluid was removed. A fluid sample was sent for laboratory analysis. IMPRESSION: Successful ultrasound guided left thoracentesis yielding 2.3 L of pleural fluid. Electronically Signed   By: Aletta Edouard M.D.   On: 03/14/2018 16:13   US Thoracentesis Asp Pleural Space W/img Guide  Result Date: 03/06/2018 INDICATION: History of cirrhosis now with recurrent symptomatic left-sided pleural effusion worrisome for hepatic hydrothorax. Please from ultrasound-guided thoracentesis for therapeutic purposes. EXAM: US THORACENTESIS ASP PLEURAL SPACE W/IMG GUIDE COMPARISON:  Ultrasound-guided thoracentesis-03/22/2018 (yielding 2.2 L of pleural fluid); CT abdomen pelvis-earlier same day  MEDICATIONS: None. COMPLICATIONS: None immediate. TECHNIQUE: Informed written consent was obtained from the patient after a discussion of the risks, benefits and alternatives to treatment. A timeout was performed prior to the initiation of the procedure. Initial ultrasound scanning demonstrates a large anechoic left-sided pleural effusion. The lower chest was prepped and draped in the usual sterile fashion. 1% lidocaine was used for local anesthesia. An ultrasound image was saved for documentation purposes. An 8 Fr Safe-T-Centesis catheter was introduced. The thoracentesis was performed. Despite residual fluid within the left pleural space, the patient wished to terminate the procedure given left-sided chest pain. As such, the catheter was removed and a dressing was applied. The patient tolerated the procedure well without immediate post procedural complication. The patient was escorted to have an upright chest radiograph. FINDINGS: A total of approximately 1.9 liters of amber colored serous fluid was removed. IMPRESSION: Successful ultrasound-guided left sided thoracentesis yielding 1.9 liters of pleural fluid. Electronically Signed   By: Sandi Mariscal M.D.   On: 03/06/2018 16:04     LOS: 5 days   Oren Binet, MD  [facility]  If 7PM-7AM, please contact night-coverage  Please page via [email or web address]  Go to [email or web address] and use [place]'s universal password to access. If you do not have the password, please contact the hospital operator.  Locate the [facility] provider you are looking for under [facility] and page to a number that you can be directly reached. If you still have difficulty reaching the provider, please page the [facility] (Director on Call) for the Hospitalists listed on amion for assistance.  04/02/2018, 4:18 PM

## 2018-04-03 NOTE — Progress Notes (Signed)
Spoke to IR. Will be unable to place plrex cath today due to time. [place] for patient to eat and Make NPO at midnight.

## 2018-04-03 NOTE — Progress Notes (Addendum)
NCM spoke with pt's husband along with pt's [facility] doctor [facility] Lutricia Feil).  Husband communicated he's unable to provide 24/7 supervision/ assistance once d/c. Voiced he would like for pt to d/c to residential hospice vs home with hospice care. MD explained unsure if pt would qualify for residential hospice, however,  will f/u. NCM has left  Voice message with [facility] nurse ( "Trudy") regarding placement/ dispositon need, awaiting call back. Whitman Hero RN,BSN,CM

## 2018-04-03 NOTE — Progress Notes (Signed)
Physical Therapy Treatment Patient Details Name: Kelly Mclean MRN: [id number] DOB: August 30, 1955 Today's Date: 04/03/2018    History of Present Illness Patient is a 63 y/o female from [facility] due to recurrent hepatic hydrothorax. Past medical history significant of HTN, liver cirrhosis. Admitted for hepatic hydrothorax with possible TIPS procedure.     PT Comments    Patient progressing slowly and now plans as noted below.  Eager, however to maintain mobility and QOL as long as possible.  Will follow acutely.   Follow Up Recommendations  No PT follow up(plans for residential Hospice)     Equipment Recommendations  None recommended by PT    Recommendations for Other Services       Precautions / Restrictions Precautions Precautions: Fall    Mobility  Bed Mobility Overal bed mobility: Needs Assistance Bed Mobility: Sit to Supine       Sit to supine: Supervision   General bed mobility comments: cues for positioning to supine  Transfers Overall transfer level: Needs assistance Equipment used: Rolling walker (2 wheeled) Transfers: Sit to/from Stand Sit to Stand: Min guard         General transfer comment: for balance  Ambulation/Gait Ambulation/Gait assistance: Min assist Gait Distance (Feet): 120 Feet Assistive device: Rolling walker (2 wheeled) Gait Pattern/deviations: Step-through pattern;Decreased stride length     General Gait Details: SOB with ambulation on 3L O2, SpO2 94%, HR 89.     Stairs             Wheelchair Mobility    Modified Rankin (Stroke Patients Only)       Balance Overall balance assessment: Mild deficits observed, not formally tested Sitting-balance support: Feet supported Sitting balance-Leahy Scale: Fair Sitting balance - Comments: seated EOB for brushing her teeth   Standing balance support: Bilateral upper extremity supported Standing balance-Leahy Scale: Poor Standing balance comment: UE support for balance                             Cognition Arousal/Alertness: Awake/alert Behavior During Therapy: WFL for tasks assessed/performed Overall Cognitive Status: Within Functional Limits for tasks assessed                                        Exercises General Exercises - Lower Extremity Hip Flexion/Marching: AROM;Both;10 reps;Seated Other Exercises Other Exercises: sit to stand x 3 reps    General Comments        Pertinent Vitals/Pain Pain Assessment: No/denies pain    Home Living                      Prior Function            PT Goals (current goals can now be found in the care plan section) Progress towards PT goals: Progressing toward goals    Frequency    Min 2X/week      PT Plan Discharge plan needs to be updated;Frequency needs to be updated    Co-evaluation              AM-PAC PT "6 Clicks" Mobility   Outcome Measure  Help needed turning from your back to your side while in a flat bed without using bedrails?: A Little Help needed moving from lying on your back to sitting on the side of a flat bed without using bedrails?: A Little Help  needed moving to and from a bed to a chair (including a wheelchair)?: A Little Help needed standing up from a chair using your arms (e.g., wheelchair or bedside chair)?: A Little Help needed to walk in hospital room?: A Little Help needed climbing 3-5 steps with a railing? : A Lot 6 Click Score: 17    End of Session Equipment Utilized During Treatment: Gait belt;Oxygen Activity Tolerance: Patient limited by fatigue Patient left: in bed;with call bell/phone within reach   PT Visit Diagnosis: Other abnormalities of gait and mobility (R26.89);Muscle weakness (generalized) (M62.81)     Time: 1448-1856 PT Time Calculation (min) (ACUTE ONLY): 37 min  Charges:  $Gait Training: 8-22 mins $Therapeutic Exercise: 8-22 mins                     Magda Kiel, [place] [facility] [phone] 04/03/2018    Reginia Naas 04/03/2018, 5:33 PM

## 2018-04-03 NOTE — Progress Notes (Signed)
PROGRESS NOTE        PATIENT DETAILS Name: Kelly Mclean Age: 63 y.o. Sex: female Date of Birth: [birth year] Admit Date: 03/28/2018 Admitting Physician Etta Quill, DO LPF:XTKW, Lemmie Evens, MD  Brief Narrative: Patient is a 63 y.o. 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  See below for further details  Subjective:  Lying comfortably in bed-awaiting Pleurx catheter placement later today.  Assessment/Plan: Recurrent left-sided pleural effusion secondary to hepatic hydrothorax: Required repeated thoracocentesis x 6 in the past 2 weeks.  Thought to have hepatic hydrothorax-IR consulted for TIPS procedure-but given high meld score-risks may be unacceptable.  IR planning to place a Pleurx catheter (for comfort).   Pleural fluid cytology x3- for malignancy.  Decompensated liver cirrhosis: Secondary to NASH.  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  AKI on CKD stage III: Likely hepatorenal syndrome-creatinine has plateaued-given that it could be hepatorenal-high likelihood that renal function will not dramatically improve to lower the meld score any further.  Nephrology following-remains on octreotide, midodrine and IV albumin.   Anemia: Probably has anemia secondary to liver cirrhosis/CKD-worsened due to acute illness.  No evidence of overt blood loss-denies melena or hematochezia.  Has required 1 unit of PRBC transfusion so far-hemoglobin stable at 9.0.  Follow periodically.   Thrombocytopenia: Suspect related to liver cirrhosis/hypersplenism.  Follow.  Deconditioning: PT evaluation appreciated-home health services on discharge.  Depression/anxiety: Appears stable-continue BuSpar and Celexa.  Palliative care: Unfortunate 63 year old with [name] related liver  cirrhosis-now with hepatic hydrothorax and worsening renal function.  Poor prognosis-seen by palliative care-DNR in place-tentative plans are to discharge with hospice care-once Pleurx catheter is placed.  DVT Prophylaxis: SCD's  Code Status: DNR  Family Communication: Spouse at bedside  Disposition Plan: Remain inpatient-Home with hospice care hopefully on 3/13 once Pleurx catheter is placed  Antimicrobial agents: Anti-infectives (From admission, onward)   None      Procedures: None  CONSULTS:  IR  Time spent: 15 minutes-Greater than 50% of this time was spent in counseling, explanation of diagnosis, planning of further management, and coordination of care.  MEDICATIONS: Scheduled Meds:  sodium chloride   Intravenous Once   busPIRone  10 mg Oral TID   calcium carbonate  2 tablet Oral BID   citalopram  20 mg Oral Daily   docusate  100 mg Oral BID   famotidine  10 mg Oral QHS   gabapentin  200 mg Oral QHS   midodrine  10 mg Oral TID WC   octreotide  150 mcg Subcutaneous Q8H   primidone  50 mg Oral BID   Continuous Infusions:  PRN Meds:.albuterol, ondansetron **OR** ondansetron (ZOFRAN) IV   PHYSICAL EXAM: Vital signs: Vitals:   04/02/18 2119 04/03/18 0543 04/03/18 0612 04/03/18 1301  BP: (!) 138/57 (!) 134/52  (!) 150/60  Pulse: 74 81  73  Resp: 15 16  20   Temp: 98.1 F (36.7 C) 98.1 F (36.7 C)  98.4 F (36.9 C)  TempSrc: Oral Oral  Oral  SpO2: 100% 98%  98%  Weight:   68.3 kg   Height:       Filed Weights   04/01/18 0604 04/02/18 0646 04/03/18 0612  Weight: 68.2 kg 67.4 kg 68.3 kg   Body  mass index is 27.54 kg/m.   General appearance:Awake, alert, not in any distress.  Eyes:no scleral icterus. HEENT: Atraumatic and Normocephalic Neck: supple, no JVD. Resp: Decreased air entry in the left-no added sounds heard CVS: S1 S2 regular, no murmurs.  GI: Bowel sounds present, Non tender and not distended with no gaurding, rigidity or  rebound. Extremities: B/L Lower Ext shows no edema, both legs are warm to touch Neurology:  Non focal Psychiatric: Normal judgment and insight. Normal mood. Musculoskeletal:No digital cyanosis Skin:No Rash, warm and dry Wounds:N/A  I have personally reviewed following labs and imaging studies  LABORATORY DATA: CBC: Recent Labs  Lab 03/28/18 2130 03/30/18 0550 03/31/18 0208 04/01/18 0322 04/02/18 0330  WBC 4.9 2.4* 1.9* 1.9* 2.2*  HGB 9.1* 7.4* 6.9* 7.9* 9.0*  HCT 27.3* 22.3* 21.2* 24.1* 27.8*  MCV 101.5* 102.3* 101.9* 101.3* 101.1*  PLT 81* 54* 49* 48* 53*    Basic Metabolic Panel: Recent Labs  Lab 03/28/18 2130 03/30/18 0550 03/31/18 0208 04/01/18 0322 04/02/18 0330  NA 133* 135 134* 138 141  K 5.1 4.3 4.9 4.6 4.5  CL 103 107 107 107 109  CO2 23 24 22 24 27   GLUCOSE 90 79 132* 105* 88  BUN 45* 44* 38* 33* 34*  CREATININE 2.32* 2.06* 1.94* 1.96* 2.27*  CALCIUM 8.8* 8.8* 9.0 9.8 10.2  PHOS  --   --   --   --  3.5    GFR: Estimated Creatinine Clearance: 23.6 mL/min (A) (by C-G formula based on SCr of 2.27 mg/dL (H)).  Liver Function Tests: Recent Labs  Lab 03/28/18 2130 03/30/18 0550 04/01/18 0322 04/02/18 0330  AST 43* 30 28 24   ALT 31 24 18 16   ALKPHOS 87 61 34* 41  BILITOT 1.2 1.3* 2.0* 1.8*  PROT 5.9* 5.5* 6.3* 6.3*  ALBUMIN 2.3* 3.0* 5.0 4.8   No results for input(s): LIPASE, AMYLASE in the last 168 hours. No results for input(s): AMMONIA in the last 168 hours.  Coagulation Profile: Recent Labs  Lab 04/02/18 0330  INR 1.7*    Cardiac Enzymes: No results for input(s): CKTOTAL, CKMB, CKMBINDEX, TROPONINI in the last 168 hours.  BNP (last 3 results) No results for input(s): PROBNP in the last 8760 hours.  HbA1C: No results for input(s): HGBA1C in the last 72 hours.  CBG: No results for input(s): GLUCAP in the last 168 hours.  Lipid Profile: No results for input(s): CHOL, HDL, LDLCALC, TRIG, CHOLHDL, LDLDIRECT in the last 72  hours.  Thyroid Function Tests: No results for input(s): TSH, T4TOTAL, FREET4, T3FREE, THYROIDAB in the last 72 hours.  Anemia Panel: No results for input(s): VITAMINB12, FOLATE, FERRITIN, TIBC, IRON, RETICCTPCT in the last 72 hours.  Urine analysis:    Component Value Date/Time   COLORURINE YELLOW 03/29/2018 2058   APPEARANCEUR CLEAR 03/29/2018 2058   LABSPEC 1.012 03/29/2018 2058   PHURINE 5.0 03/29/2018 2058   GLUCOSEU NEGATIVE 03/29/2018 2058   HGBUR LARGE (A) 03/29/2018 2058   BILIRUBINUR NEGATIVE 03/29/2018 2058   [place] NEGATIVE 03/29/2018 2058   PROTEINUR NEGATIVE 03/29/2018 2058   NITRITE NEGATIVE 03/29/2018 2058   LEUKOCYTESUR TRACE (A) 03/29/2018 2058    Sepsis Labs: Lactic Acid, Venous No results found for: LATICACIDVEN  MICROBIOLOGY: No results found for this or any previous visit (from the past 240 hour(s)).  RADIOLOGY STUDIES/RESULTS: Ct Abdomen Pelvis Wo Contrast  Result Date: 03/20/2018 CLINICAL DATA:  History of cirrhosis of liver, dyspnea. Hepatic hydrothorax. Fluid with lymphocytes requiring rule out  lymphoma. EXAM: CT ABDOMEN AND PELVIS WITHOUT CONTRAST TECHNIQUE: Multidetector CT imaging of the abdomen and pelvis was performed following the standard protocol without IV contrast. COMPARISON:  CT abdomen dated 03/06/2018 FINDINGS: Lower chest: Again noted is a large LEFT pleural effusion, incompletely imaged. Lingular consolidation is likely associated atelectasis. Hepatobiliary: Cirrhotic appearing liver. Gallstones are present within the nondistended gallbladder. No bile duct dilatation seen. Pancreas: Unremarkable. No pancreatic ductal dilatation or surrounding inflammatory changes. Spleen: Normal in size without focal abnormality. Adrenals/Urinary Tract: Kidneys are unremarkable without mass, stone or hydronephrosis. No perinephric fluid. No ureteral or bladder calculi identified. Bladder is obscured by overlying ascites. Stomach/Bowel: No dilated large or  small bowel loops. 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 Appendix is normal. Stomach is unremarkable. Vascular/Lymphatic: Aortic atherosclerosis. Enlarged lymph node adjacent to the pancreatic head, 1.6 cm, better seen on the recent contrast-enhanced CT of 03/06/2018. No other enlarged lymph nodes appreciated in the abdomen or pelvis. Reproductive: Uterus appears normal. Ascites obscures visualization of the bilateral adnexa. Other: Moderate amount of ascites within the abdomen and pelvis, largest component within the pelvis. No evidence of circumscribed fluid collection or abscess. No free intraperitoneal air. Musculoskeletal: No acute or suspicious osseous finding. Superficial soft tissues are unremarkable. IMPRESSION: 1. Cirrhotic liver. 2. Moderate amount of ascites within the abdomen and pelvis, largest component within the pelvis. 3. Enlarged lymph node adjacent to the pancreatic head, measuring 1.6 cm, better seen on the recent contrast-enhanced CT of 03/06/2016, favor reactive over neoplastic etiology given the absence of additional lymphadenopathy. 4. Large LEFT pleural effusion, incompletely imaged. 5. Cholelithiasis without evidence of acute cholecystitis. Aortic Atherosclerosis (ICD10-I70.0). Electronically Signed   By: Franki Cabot M.D.   On: 03/20/2018 14:20   Dg Chest 1 View  Result Date: 03/30/2018 CLINICAL DATA:  Status post thoracentesis. EXAM: CHEST  1 VIEW COMPARISON:  03/27/2017 FINDINGS: Midline trachea. Normal heart size. Small right pleural effusion is suspected. The left-sided effusion has decreased. No pneumothorax. Improved aeration with decreased left-sided atelectasis. Mild subsegmental atelectasis remains at both lung bases. IMPRESSION: Decreased left pleural effusion, without pneumothorax. Improved aeration with mild bibasilar atelectasis remaining. Electronically Signed   By: Abigail Miyamoto M.D.   On: 03/30/2018 13:47   Dg Chest 1 View  Result Date: 03/26/2018 CLINICAL DATA:   Shortness of breath. EXAM: CHEST  1 VIEW COMPARISON:  Multiple prior exams most recent radiograph 03/24/2018. Most recent CT 03/14/2018 FINDINGS: Known left pleural effusion has increased in size over the past 2 days, now large in size. Associated compressive atelectasis throughout the left lung. Mild rightward mediastinal shift versus rotation. Only a small portion of aerated left perihilar lung. Hypoventilatory right lung without acute finding. No visualized pneumothorax. IMPRESSION: Increased size of known left pleural effusion, now large in size. This causes mild mass effect in the mediastinum with rightward mediastinal shift. Electronically Signed   By: Keith Rake M.D.   On: 03/26/2018 19:37   Dg Chest 1 View  Result Date: 03/14/2018 CLINICAL DATA:  Status post thoracentesis EXAM: CHEST  1 VIEW COMPARISON:  March 14, 2018 12:56 p.m. FINDINGS: The heart size and mediastinal contours are within normal limits. There is small left pleural effusion significantly decreased compared prior exam. Consolidation left lung base is noted. There is no pneumothorax. The right lung is clear. The visualized skeletal structures are stable. IMPRESSION: There is small left pleural effusion, significantly decreased compared prior exam. Consolidation of left lung base is identified. There is no pneumothorax. Electronically Signed  By: Abelardo Diesel M.D.   On: 03/14/2018 16:19   Dg Chest 2 View  Result Date: 04/02/2018 CLINICAL DATA:  Shortness of breath EXAM: CHEST - 2 VIEW COMPARISON:  Three days ago FINDINGS: Large and significantly increased left pleural effusion. Borderline cardiopericardial enlargement, distorted by pleural fluid. Streaky density at the medial right base also seen previously. IMPRESSION: Large left pleural effusion with significant re-accumulation since 3 days ago. Electronically Signed   By: Monte Fantasia M.D.   On: 04/02/2018 09:39   Ct Chest Wo Contrast  Result Date:  03/14/2018 CLINICAL DATA:  Pleural effusion and dyspnea since yesterday. EXAM: CT CHEST WITHOUT CONTRAST TECHNIQUE: Multidetector CT imaging of the chest was performed following the standard protocol without IV contrast. COMPARISON:  03/14/2018 CXR FINDINGS: Cardiovascular: Common arterial branch of the right brachiocephalic left common carotid arteries. Atherosclerosis of left subclavian artery origin. Nonaneurysmal atherosclerotic aorta. The unenhanced pulmonary vessels are unremarkable. The heart size is normal with small anterior pericardial effusion without thickening noted. Mediastinum/Nodes: Small subcentimeter prevascular and paratracheal lymph nodes. 1 cm short axis subcarinal lymph node. Patent trachea and mainstem bronchi. Unremarkable CT appearance of the esophagus. The thyroid gland is unremarkable without dominant mass. Lungs/Pleura: Moderate to large layering left effusion with adjacent atelectasis. The right lung is clear. No pneumothorax or dominant mass. Upper Abdomen: Cirrhotic appearance of the liver with moderate to large volume of ascites. Musculoskeletal: No chest wall mass or suspicious bone lesions identified. Mild thoracic spondylosis. IMPRESSION: 1. Moderate to large layering left pleural effusion with adjacent atelectasis. 2. Cirrhotic liver with moderate to large volume of ascites. Aortic Atherosclerosis (ICD10-I70.0). Electronically Signed   By: Ashley Royalty M.D.   On: 03/14/2018 22:17   [place] Intraoperative  Result Date: 03/19/2018 INDICATION: 63 year old with cirrhosis and recurrent left hydrothorax. Plan for nuclear medicine ascites - hydrothorax examination. Plan for an ultrasound-guided paracentesis and injection of radiopharmaceutical into the ascites. Left thoracentesis was performed prior to this procedure. EXAM: ULTRASOUND GUIDED PARACENTESIS INJECTION OF RADIOPHARMACEUTICAL INTO ASCITES MEDICATIONS: None. COMPLICATIONS: None immediate. PROCEDURE: Informed written consent was  obtained from the patient after a discussion of the risks, benefits and alternatives to treatment. A timeout was performed prior to the initiation of the procedure. Initial ultrasound scanning demonstrates a small amount of ascites within the right upper abdominal quadrant. The right upper abdomen was prepped and draped in the usual sterile fashion. 1% lidocaine with was used for local anesthesia. Following this, a 6 Fr Safe-T-Centesis catheter was introduced. An ultrasound image was saved for documentation purposes. The paracentesis was performed. 60 mL of opaque yellow fluid was removed. The radiopharmaceutical was injected through the Safe-T-Centesis catheter. Catheter was flushed with sterile saline. The catheter was removed and a dressing was applied. The patient tolerated the procedure well without immediate post procedural complication. FINDINGS: Small amount of fluid around the liver. Small amount of fluid in the left lower quadrant. 60 mL of fluid was removed from the right upper quadrant. The radiopharmaceutical was injected into the right upper quadrant. IMPRESSION: Successful ultrasound-guided paracentesis yielding 60 mL of ascites. Fluid was sent for labs. Radiopharmaceutical was injected through the paracentesis catheter for the nuclear medicine examination. Electronically Signed   By: Markus Daft M.D.   On: 03/19/2018 17:06   US Renal  Result Date: 03/30/2018 CLINICAL DATA:  Renal failure.  Cirrhosis. EXAM: RENAL / URINARY TRACT ULTRASOUND COMPLETE COMPARISON:  CT scan March 20, 2018 FINDINGS: Right Kidney: Renal measurements: 9 x 4.6 x 5.3 cm =  volume: 114 mL. Contain in upper pole levin mm complex cysts. Contains a 7 mm lower pole cyst. Increased cortical echogenicity. Left Kidney: Renal measurements: 7.9 x 3.8 x 4.7 cm = volume: 73 mL. Increased cortical echogenicity. Bladder: Appears normal for degree of bladder distention. IMPRESSION: 1. Two small cysts in the right kidney, 1 of which is  mildly complex. Medical renal disease with increased cortical echogenicity. No hydronephrosis. 2. Nodular liver consistent with cirrhosis.  Ascites. 3. The spleen is prominent measuring up to 13.2 cm. However, this volume of 357 cc does not meet the criteria for splenomegaly. Electronically Signed   By: Dorise Bullion III M.D   On: 03/30/2018 16:13   Nm Interstitial Rad Source Applic Complex  Result Date: 03/19/2018 CLINICAL DATA:  63 year old with cirrhosis and recurrent left hydrothorax. Patient is being evaluated for a hepatic hydrothorax. EXAM: NUCLEAR MEDICINE SULFUR COLLOID PERITONEAL SCINTIGRAPHY TECHNIQUE: Ultrasound-guided left thoracentesis was performed. 1.1 L of left pleural fluid was removed but not all of the fluid was removed. Subsequently, an ultrasound-guided paracentesis was performed in the right upper quadrant. 60 mL of peritoneal fluid was removed. Technetium 99 M sulfur colloid was injected through the paracentesis catheter. Paracentesis catheter was removed. RADIOPHARMACEUTICALS:  7 millicuries technetium 99 M sulfur colloid COMPARISON:  Chest CT 03/14/2018 FINDINGS: Imaging was obtained over 1 hour. The initial images demonstrated radiopharmaceutical throughout the peritoneal cavity. The largest peritoneal pockets are in the right upper quadrant and left lower quadrant. Over time, radiopharmaceutical starts to accumulate in the left hemithorax. Findings are compatible with a trans diaphragmatic connection between the peritoneal cavity and the left pleural space. IMPRESSION: Study is positive for connection between the peritoneal space and left pleural space. 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 Electronically Signed   By: Markus Daft M.D.   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 Plan for nuclear medicine ascites - hydrothorax examination. Plan for an  ultrasound-guided paracentesis and injection of radiopharmaceutical into the ascites. Left thoracentesis was performed prior to this procedure. EXAM: ULTRASOUND GUIDED PARACENTESIS INJECTION OF RADIOPHARMACEUTICAL INTO ASCITES MEDICATIONS: None. COMPLICATIONS: None immediate. PROCEDURE: Informed written consent was obtained from the patient after a discussion of the risks, benefits and alternatives to treatment. A timeout was performed prior to the initiation of the procedure. Initial ultrasound scanning demonstrates a small amount of ascites within the right upper abdominal quadrant. The right upper abdomen was prepped and draped in the usual sterile fashion. 1% lidocaine with was used for local anesthesia. Following this, a 6 Fr Safe-T-Centesis catheter was introduced. An ultrasound image was saved for documentation purposes. The paracentesis was performed. 60 mL of opaque yellow fluid was removed. The radiopharmaceutical was injected through the Safe-T-Centesis catheter. Catheter was flushed with sterile saline. The catheter was removed and a dressing was applied. The patient tolerated the procedure well without immediate post procedural complication. FINDINGS: Small amount of fluid around the liver. Small amount of fluid in the left lower quadrant. 60 mL of fluid was removed from the right upper quadrant. The radiopharmaceutical was injected into the right upper quadrant. IMPRESSION: Successful ultrasound-guided paracentesis yielding 60 mL of ascites. Fluid was sent for labs. Radiopharmaceutical was injected through the paracentesis catheter for the nuclear medicine examination. Electronically Signed   By: Markus Daft M.D.   On: 03/19/2018 17:06   Ct Abdomen W Wo Contrast  Result Date: 03/06/2018 CLINICAL DATA:  Pancreatic  mass seen on earlier [place]. Pancreatic mass workup . Cirrhosis. Epigastric and LUQ pain. 24m of Omni 300 used. Pt could only tolerate laying on her left side for scan.^1010mOMNIPAQUE IOHEXOL  300 MG/ML SOLNPancreatic mass EXAM: CT ABDOMEN WITHOUT AND WITH CONTRAST TECHNIQUE: Multidetector CT imaging of the abdomen was performed following the standard protocol before and following the bolus administration of intravenous contrast. CONTRAST:  10069mMNIPAQUE IOHEXOL 300 MG/ML  SOLN COMPARISON:  Ultrasound 03/04/2010 FINDINGS: Lower chest:  Large LEFT pleural effusion with passive atelectasis. Hepatobiliary: Nodular liver. Caudate lobe is enlarged. Portal veins are patent. Multiple gallstones within lumen gallbladder. No enhancing hepatic lesion. No intrahepatic or extrahepatic biliary duct dilatation. Pancreas: No abnormality of the pancreatic head body or tail. No duct dilatation. Periampullary duodenum diverticulum noted. Lymph node position inferior to the pancreas adjacent to the portal vein measures 16 mm (image 49/4) Spleen: Spleen borderline enlarged. Adrenals/urinary tract: LEFT kidney is normal. RIGHT kidney normal. No adrenal abnormality Stomach/Bowel: Stomach and limited of the small bowel is unremarkable Vascular/Lymphatic: Abdominal aortic normal caliber. No retroperitoneal periportal lymphadenopathy. Musculoskeletal: No aggressive osseous lesion IMPRESSION: 1. No pancreatic lesion identified. 2. Peripancreatic lymph node is mildly enlarged. 3. Small periampullary duodenum diverticulum present. 4. Morphologic changes in liver consistent with cirrhosis. No biliary duct dilatation of the intrahepatic or extrahepatic bile ducts. 5. No enhancing hepatic lesion. 6. Cholelithiasis. 7. Moderate volume of intraperitoneal free fluid. 8. Large LEFT pleural effusion Electronically Signed   By: SteSuzy BouchardD.   On: 03/06/2018 16:31   Dg Bone Density (dxa)  Result Date: 03/11/2018 EXAM: DUAL X-RAY ABSORPTIOMETRY (DXA) FOR BONE MINERAL DENSITY IMPRESSION: Dear Dr, RosLutricia Feilour patient LesYanil Dawempleted a FRAX assessment on 03/11/2018 using the LunMuskegonnalysis version:  14.10) manufactured by [employer] following summarizes the results of our evaluation. PATIENT BIOGRAPHICAL: Name: LapBobette, Leyhtient ID: [id number] Date: 02/August 07, 1957ight:    62.5 in. Gender:     Female    [age]       Weight:    158.4 lbs. Ethnicity:  White                            Exam Date: 03/11/2018 FRAX* RESULTS:  (version: 3.5) 10-year Probability of Fracture1 Major Osteoporotic Fracture2 Hip Fracture 8.1% 0.7% Population: [place]) Risk Factors: None Based on Femur (Left) Neck BMD 1 -The 10-year probability of fracture may be lower than reported if the patient has received treatment. 2 -Major Osteoporotic Fracture: Clinical Spine, Forearm, Hip or Shoulder *FRAX is a traMaterials engineer the [employer] SheWalt Disneyr Metabolic Bone Disease, a WorUnionHO) ColQuest DiagnosticsSSESSMENT: The probability of a major osteoporotic fracture is 8.1% within the next ten years. The probability of a hip fracture is 0.7% within the next ten years. . Technologist: SCE PATIENT BIOGRAPHICAL: Name: LapMargerite, Impastatotient ID: [id number] Date: 02/12-Jul-1957ight: 64.5 in. Gender: Female Exam Date: 03/11/2018 Weight: 158.4 lbs. Indications: Asthma, Caucasian, Cirrhosis, Early Menopause, Postmenopausal, Previous Smoker Fractures: Treatments: Gabapentin, primidone ASSESSMENT: The BMD measured at Femur Neck Left is 0.833 g/cm2 with a T-score of -1.5. This patient is considered osteopenic according to Wor[facility]HAdv[facility] Hospitalriteria. The quality of the scan is good. Site Region Measured Measured WHO Young Adult BMD Date       Age      Classification T-score AP  Spine L1-L4 03/11/2018 [age] Osteopenia -1.5 1.007 g/cm2 DualFemur Neck Left 03/11/2018 63.0 Osteopenia -1.5 0.833 g/cm2 World Health Organization [facility]) criteria for post-menopausal, Caucasian Women: Normal:       T-score at or above -1 SD Osteopenia:   T-score between -1 and -2.5 SD  Osteoporosis: T-score at or below -2.5 SD RECOMMENDATIONS: 1. All patients should optimize calcium and vitamin D intake. 2. Consider FDA-approved medical therapies in postmenopausal women and men aged 60 years and older, based on the following: a. A hip or vertebral(clinical or morphometric) fracture b. T-score < -2.5 at the femoral neck or spine after appropriate evaluation to exclude secondary causes c. Low bone mass (T-score between -1.0 and -2.5 at the femoral neck or spine) and a 10-year probability of a hip fracture > 3% or a 10-year probability of a major osteoporosis-related fracture > 20% based on the [place]-adapted WHO algorithm d. Clinician judgment and/or patient preferences may indicate treatment for people with 10-year fracture probabilities above or below these levels FOLLOW-UP: People with diagnosed cases of osteoporosis or at high risk for fracture should have regular bone mineral density tests. For patients eligible for Medicare, routine testing is allowed once every 2 years. The testing frequency can be increased to one year for patients who have rapidly progressing disease, those who are receiving or discontinuing medical therapy to restore bone mass, or have additional risk factors. I have reviewed this report, and agree with the above findings. [facility] Electronically Signed   By: Lowella Grip III M.D.   On: 03/11/2018 11:23   Dg Chest Port 1 View  Result Date: 03/28/2018 CLINICAL DATA:  Recurrent effusion EXAM: PORTABLE CHEST 1 VIEW COMPARISON:  03/27/2018, 03/26/2018, 03/24/2018 FINDINGS: Interval diffuse hazy opacity left thorax likely due to layering effusion, moderate in size. This is increased compared to prior. Underlying edema not excluded. Stable cardiomediastinal silhouette. Left basilar airspace disease. IMPRESSION: Increased moderate left pleural effusion with development of diffuse hazy opacity in the left thorax which may reflect layering fluid and or edema.  Increased airspace disease at the left base. Electronically Signed   By: Donavan Foil M.D.   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 EXAM: PORTABLE CHEST 1 VIEW COMPARISON:  Chest radiograph-03/26/2018 FINDINGS: Grossly unchanged cardiac silhouette and mediastinal contours. Interval reduction in persistent moderate size left-sided effusion post thoracentesis. No pneumothorax. Improved aeration of the left mid and lower lung with persistent left basilar heterogeneous/consolidative opacities. No new focal airspace opacities. No definite right-sided pleural effusion. No evidence of edema. No acute osseous abnormalities. IMPRESSION: 1. Interval reduction in persistent moderate size left-sided effusion post thoracentesis. No pneumothorax. 2. Improved aeration of the left lung base with persistent left basilar atelectasis. Electronically Signed   By: Sandi Mariscal M.D.   On: 03/27/2018 13:34   Dg Chest Port 1 View  Result Date: 03/24/2018 CLINICAL DATA:  Post left thoracentesis EXAM: PORTABLE CHEST 1 VIEW COMPARISON:  03/21/2018 FINDINGS: Moderate to large left pleural effusion minimally changed since prior study. No pneumothorax. Left basilar opacity, likely atelectasis. No confluent opacity on the right. Heart is upper limits normal in size. IMPRESSION: Moderate to large-sized left pleural effusion minimally changed. No pneumothorax. Continued left base atelectasis or infiltrate. Electronically Signed   By: Rolm Baptise M.D.   On: 03/24/2018 12:46   Dg Chest Port 1 View  Result Date: 03/19/2018 CLINICAL DATA:  Status post thoracentesis. EXAM: PORTABLE CHEST 1 VIEW COMPARISON:  03/14/2018 FINDINGS: 1702 hours. Moderate to large left pleural effusion noted without evidence for pneumothorax. Left base collapse/consolidation. The cardio pericardial silhouette is enlarged. The visualized  bony structures of the thorax are intact. Telemetry leads overlie the chest. IMPRESSION: 1. No evidence for pneumothorax status post thoracentesis. 2. Moderate to large left pleural effusion. Electronically Signed   By: Misty Stanley M.D.   On: 03/19/2018 18:56   Dg Chest Portable 1 View  Result Date: 03/14/2018 CLINICAL DATA:  Shortness of breath. EXAM: PORTABLE CHEST 1 VIEW COMPARISON:  Chest x-ray 03/06/2018. FINDINGS: Mediastinum and hilar structures normal. Large left pleural effusion, increased in size from prior exam. Underlying left lung atelectasis/infiltrate can not be excluded. No pneumothorax. Heart size most likely stable. IMPRESSION: Large left pleural effusion, increased from prior exam. Electronically Signed   By: Marcello Moores  Register   On: 03/14/2018 13:19   Dg Chest Port 1 View  Result Date: 03/06/2018 CLINICAL DATA:  Post large volume left-sided thoracentesis. EXAM: PORTABLE CHEST 1 VIEW COMPARISON:  Chest radiograph-02/20/2018; CT abdomen pelvis-earlier same day FINDINGS: Interval reduction in persistent moderate size left-sided effusion post large volume thoracentesis. No pneumothorax. Improved aeration of left lung base with persistent left basilar heterogeneous/consolidative opacities. No definite evidence of right-sided pleural effusion. No new focal airspace opacities. Unchanged cardiac silhouette and mediastinal contours. No acute osseous abnormalities. IMPRESSION: Interval reduction in persistent moderate sized left-sided effusion post thoracentesis. No pneumothorax. Electronically Signed   By: Sandi Mariscal M.D.   On: 03/06/2018 16:02   Mm 3d Screen Breast Bilateral  Result Date: 03/12/2018 CLINICAL DATA:  Screening. EXAM: DIGITAL SCREENING BILATERAL MAMMOGRAM WITH TOMO AND CAD COMPARISON:  Previous exam(s). ACR Breast Density Category b: There are scattered areas of fibroglandular density. FINDINGS: There are no findings suspicious for malignancy. Images were processed with CAD.  IMPRESSION: No mammographic evidence of malignancy. A result letter of this screening mammogram will be mailed directly to the patient. RECOMMENDATION: Screening mammogram in one year. (Code:SM-B-01Y) BI-RADS CATEGORY  1: Negative. Electronically Signed   By: Dorise Bullion III M.D   On: 03/12/2018 14:44   Nm Hepato Biliary Leak  Result Date: 03/17/2018 CLINICAL DATA:  Cholelithiasis. Nausea and vomiting. Hepatic cirrhosis. EXAM: NUCLEAR MEDICINE HEPATOBILIARY IMAGING TECHNIQUE: Sequential images of the abdomen were obtained out to 60 minutes following intravenous administration of radiopharmaceutical. RADIOPHARMACEUTICALS:  5.1 mCi Tc-23m Choletec IV COMPARISON:  CT on 03/14/2018 FINDINGS: Delayed uptake and biliary excretion of activity by the liver is seen, consistent with known hepatic cirrhosis. Gallbladder activity is visualized, consistent with patency of cystic duct. Biliary activity passes into small bowel, consistent with patent common bile duct. IMPRESSION: No evidence of acute cholecystitis or biliary ductal dilatation. Hepatocellular dysfunction, consistent with hepatic cirrhosis. Electronically Signed   By: JEarle GellM.D.   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 Request for therapeutic thoracentesis today in IR. EXAM: ULTRASOUND GUIDED LEFT THORACENTESIS MEDICATIONS: 10 mL 1% lidocaine. COMPLICATIONS: None immediate. PROCEDURE: An ultrasound guided thoracentesis was thoroughly discussed with the patient and questions answered. The benefits, risks, alternatives and complications were also discussed. The patient understands and wishes to proceed with the procedure. Written consent was obtained. Ultrasound was performed to localize and mark an adequate pocket of fluid in the left chest. The area was then prepped and draped in  the normal  sterile fashion. 1% Lidocaine was used for local anesthesia. Under ultrasound guidance a 6 Fr Safe-T-Centesis catheter was introduced. Thoracentesis was performed. The catheter was removed and a dressing applied. FINDINGS: A total of approximately 2.1 L of chylous fluid was removed. IMPRESSION: Successful ultrasound guided left thoracentesis yielding 2.1 L of pleural fluid. Read by Candiss Norse, PA-C Electronically Signed   By: Jerilynn Mages.  Shick M.D.   On: 03/30/2018 12:54   US Thoracentesis Asp Pleural Space W/img Guide  Result Date: 03/27/2018 INDICATION: History of left-sided hepatic hydrothorax now with recurrent symptomatic left-sided pleural effusion. Please perform ultrasound-guided thoracentesis for therapeutic purposes. EXAM: US THORACENTESIS ASP PLEURAL SPACE W/IMG GUIDE COMPARISON:  Chest radiograph-03/26/2018 MEDICATIONS: None. COMPLICATIONS: None immediate. TECHNIQUE: Informed written consent was obtained from the patient after a discussion of the risks, benefits and alternatives to treatment. A timeout was performed prior to the initiation of the procedure. Initial ultrasound scanning demonstrates a large minimally complex though predominantly anechoic left-sided pleural effusion. The lower chest was prepped and draped in the usual sterile fashion. 1% lidocaine was used for local anesthesia. An ultrasound image was saved for documentation purposes. An 8 Fr Safe-T-Centesis catheter was introduced. The thoracentesis was performed. The catheter was removed and a dressing was applied. The patient tolerated the procedure well without immediate post procedural complication. The patient was escorted to have an upright chest radiograph. FINDINGS: A total of approximately 2.2 liters of serous fluid was removed. IMPRESSION: Successful ultrasound-guided left sided thoracentesis yielding 2.2 liters of pleural fluid. Electronically Signed   By: Sandi Mariscal M.D.   On: 03/27/2018 13:37   US Thoracentesis Asp  Pleural Space W/img Guide  Result Date: 03/24/2018 INDICATION: Patient with history of hepatic hydrothorax, cirrhosis and recurrent left pleural effusion who presents today originally for paracentesis in order to obtain flow cytometry to r/o malignancy prior to possible TIPS procedure. Limited abdominal US shows scant peritoneal fluid not amenable to percutaneous drainage. Diagnostic and therapeutic thoracentesis performed in order to obtain requested labs in lieu of paracentesis given patient's history of hepatic hydrothorax. EXAM: ULTRASOUND GUIDED LEFT THORACENTESIS MEDICATIONS: 20 mL 1% lidocaine. COMPLICATIONS: None immediate. PROCEDURE: An ultrasound guided thoracentesis was thoroughly discussed with the patient and questions answered. The benefits, risks, alternatives and complications were also discussed. The patient understands and wishes to proceed with the procedure. Written consent was obtained. Ultrasound was performed to localize and mark an adequate pocket of fluid in the left chest. The area was then prepped and draped in the normal sterile fashion. 1% Lidocaine was used for local anesthesia. Under ultrasound guidance a 6 Fr Safe-T-Centesis catheter was introduced. Thoracentesis was performed. The catheter was removed and a dressing applied. FINDINGS: A total of approximately 1.1 L of pink milky fluid was removed. Samples were sent to the laboratory as requested by the clinical team. IMPRESSION: Successful ultrasound guided left thoracentesis yielding 1.1 L of pleural fluid. Read by Candiss Norse, PA-C Electronically Signed   By: Lucrezia Europe M.D.   On: 03/24/2018 12:49   US Thoracentesis Asp Pleural Space W/img Guide  Result Date: 03/19/2018 INDICATION: 63 year old with cirrhosis and recurrent left hydrothorax. Patient is being evaluated for a connection between the peritoneal and left pleural space with a nuclear medicine examination. Patient has a large amount of left pleural fluid on  ultrasound examination. Plan to remove some of the left pleural fluid prior to the nuclear medicine examination. EXAM: ULTRASOUND GUIDED LEFT THORACENTESIS MEDICATIONS: None. COMPLICATIONS: None immediate. PROCEDURE: An ultrasound  guided thoracentesis was thoroughly discussed with the patient and questions answered. The benefits, risks, alternatives and complications were also discussed. The patient understands and wishes to proceed with the procedure. Written consent was obtained. Ultrasound was performed to localize and mark an adequate pocket of fluid in the left chest. The area was then prepped and draped in the normal sterile fashion. 1% Lidocaine was used for local anesthesia. Under ultrasound guidance a 6 Fr Safe-T-Centesis catheter was introduced. Thoracentesis was performed. The catheter was removed and a dressing applied. FINDINGS: A total of approximately 1.1 L of opaque yellow fluid was removed. Samples were sent to the laboratory as requested by the clinical team. IMPRESSION: Successful ultrasound guided left thoracentesis yielding 1.1 L of pleural fluid. Electronically Signed   By: Markus Daft M.D.   On: 03/19/2018 17:11   US Thoracentesis Asp Pleural Space W/img Guide  Result Date: 03/14/2018 CLINICAL DATA:  Recurrent left pleural effusion. EXAM: ULTRASOUND GUIDED LEFT THORACENTESIS COMPARISON:  None. PROCEDURE: An ultrasound guided thoracentesis was thoroughly discussed with the patient and questions answered. The benefits, risks, alternatives and complications were also discussed. The patient understands and wishes to proceed with the procedure. Written consent was obtained. Ultrasound was performed to localize and mark an adequate pocket of fluid in the left chest. The area was then prepped and draped in the normal sterile fashion. 1% Lidocaine was used for local anesthesia. Under ultrasound guidance a 6 French Safe-T-Centesis catheter was introduced. Thoracentesis was performed. The catheter  was removed and a dressing applied. COMPLICATIONS: None FINDINGS: A total of approximately 2.3 L of cloudy, yellowish-brown fluid was removed. A fluid sample was sent for laboratory analysis. IMPRESSION: Successful ultrasound guided left thoracentesis yielding 2.3 L of pleural fluid. Electronically Signed   By: Aletta Edouard M.D.   On: 03/14/2018 16:13   US Thoracentesis Asp Pleural Space W/img Guide  Result Date: 03/06/2018 INDICATION: History of cirrhosis now with recurrent symptomatic left-sided pleural effusion worrisome for hepatic hydrothorax. Please from ultrasound-guided thoracentesis for therapeutic purposes. EXAM: US THORACENTESIS ASP PLEURAL SPACE W/IMG GUIDE COMPARISON:  Ultrasound-guided thoracentesis-03/22/2018 (yielding 2.2 L of pleural fluid); CT abdomen pelvis-earlier same day MEDICATIONS: None. COMPLICATIONS: None immediate. TECHNIQUE: Informed written consent was obtained from the patient after a discussion of the risks, benefits and alternatives to treatment. A timeout was performed prior to the initiation of the procedure. Initial ultrasound scanning demonstrates a large anechoic left-sided pleural effusion. The lower chest was prepped and draped in the usual sterile fashion. 1% lidocaine was used for local anesthesia. An ultrasound image was saved for documentation purposes. An 8 Fr Safe-T-Centesis catheter was introduced. The thoracentesis was performed. Despite residual fluid within the left pleural space, the patient wished to terminate the procedure given left-sided chest pain. As such, the catheter was removed and a dressing was applied. The patient tolerated the procedure well without immediate post procedural complication. The patient was escorted to have an upright chest radiograph. FINDINGS: A total of approximately 1.9 liters of amber colored serous fluid was removed. IMPRESSION: Successful ultrasound-guided left sided thoracentesis yielding 1.9 liters of pleural fluid.  Electronically Signed   By: Sandi Mariscal M.D.   On: 03/06/2018 16:04     LOS: 6 days   Oren Binet, MD  [facility]  If 7PM-7AM, please contact night-coverage  Please page via [email or web address]  Go to [email or web address] and use [place]'s universal password to access. If you do not have the password, please contact the hospital operator.  Locate the [facility]  provider you are looking for under [facility] and page to a number that you can be directly reached. If you still have difficulty reaching the provider, please page the [facility] (Director on Call) for the Hospitalists listed on amion for assistance.  04/03/2018, 2:32 PM

## 2018-04-04 ENCOUNTER — Inpatient Hospital Stay (HOSPITAL_COMMUNITY): Payer: Medicare (Managed Care)

## 2018-04-04 ENCOUNTER — Other Ambulatory Visit: Payer: Self-pay

## 2018-04-04 ENCOUNTER — Encounter (HOSPITAL_COMMUNITY): Payer: Self-pay | Admitting: Interventional Radiology

## 2018-04-04 HISTORY — PX: IR GUIDED DRAIN W CATHETER PLACEMENT: IMG719

## 2018-04-04 HISTORY — PX: IR US GUIDE VASC ACCESS LEFT: IMG2389

## 2018-04-04 LAB — BASIC METABOLIC PANEL
Anion gap: 16 — ABNORMAL HIGH (ref 5–15)
BUN: 37 mg/dL — ABNORMAL HIGH (ref 8–23)
CO2: 22 mmol/L (ref 22–32)
Calcium: 10.6 mg/dL — ABNORMAL HIGH (ref 8.9–10.3)
Chloride: 103 mmol/L (ref 98–111)
Creatinine, Ser: 2.37 mg/dL — ABNORMAL HIGH (ref 0.44–1.00)
GFR calc Af Amer: 25 mL/min — ABNORMAL LOW (ref >60)
GFR calc non Af Amer: 21 mL/min — ABNORMAL LOW (ref >60)
Glucose, Bld: 82 mg/dL (ref 70–99)
Potassium: 4.3 mmol/L (ref 3.5–5.1)
Sodium: 141 mmol/L (ref 135–145)

## 2018-04-04 MED ORDER — LIDOCAINE HCL 1 % IJ SOLN
INTRAMUSCULAR | Status: AC
  Filled 2018-04-04: qty 20

## 2018-04-04 MED ORDER — FENTANYL CITRATE (PF) 100 MCG/2ML IJ SOLN
INTRAMUSCULAR | Status: AC | PRN
  Administered 2018-04-04: 50 ug via INTRAVENOUS

## 2018-04-04 MED ORDER — MIDAZOLAM HCL 2 MG/2ML IJ SOLN
INTRAMUSCULAR | Status: AC | PRN
  Administered 2018-04-04: 1 mg via INTRAVENOUS

## 2018-04-04 MED ORDER — FENTANYL CITRATE (PF) 100 MCG/2ML IJ SOLN
INTRAMUSCULAR | Status: AC
  Filled 2018-04-04: qty 2

## 2018-04-04 MED ORDER — TRAMADOL HCL 50 MG PO TABS
50.0000 mg | ORAL_TABLET | Freq: Two times a day (BID) | ORAL | Status: DC | PRN
  Administered 2018-04-04 – 2018-04-09 (×8): 50 mg via ORAL
  Filled 2018-04-04 (×11): qty 1

## 2018-04-04 MED ORDER — TRAMADOL HCL 50 MG PO TABS
50.0000 mg | ORAL_TABLET | Freq: Once | ORAL | Status: AC
  Administered 2018-04-04: 50 mg via ORAL
  Filled 2018-04-04: qty 1

## 2018-04-04 MED ORDER — MIDAZOLAM HCL 2 MG/2ML IJ SOLN
INTRAMUSCULAR | Status: AC
  Filled 2018-04-04: qty 2

## 2018-04-04 NOTE — Progress Notes (Signed)
Per [facility] Aram Beecham) they will not be able to have a decision before Monday on either residential hospice or SNF. CSW let them know that patient is medically stable for discharge. FL2 complete just in case.  Percell Locus Uzziah Rigg LCSW [phone]

## 2018-04-04 NOTE — Procedures (Signed)
Interventional Radiology Procedure Note  Procedure: Left Pleur-X catheter placement.   Complications: None  Estimated Blood Loss: None  Recommendations: - No barriers to DC from IR perspective  Signed,  Criselda Peaches, MD

## 2018-04-04 NOTE — Progress Notes (Signed)
Palliative note:   Noted discussions regarding patient's disposition.  GOC have been made clear. Patient desires no further aggressive surgical interventions and her prognosis is poor and her time is limited due to declining hepatic and renal function.  Recommendations were made for home with Hospice. PT eval recommendation was for home with home health, however, spouse is concerned about caring for patient at home.  After eval of patient and discussion with PMT team member- Dr. Domingo Cocking, attending MD- Dr. Laurena Bering and patient's [facility] MD- Dr. Lutricia Feil, I am unsure about patients' eligibility for residential hospice. I am uncertain her prognosis is less than two weeks.  We would likely benefit from checking additional creatinine and albumin level and seeing her overall functional status trajectory over the weekend. This could definitely help with predicting prognosis. If her mental and functional status were to decline, Cr increase, and albumin decrease I would feel more comfortable recommending residential Hospice. I have discussed this Dr. Laurena Bering and Dr. Lutricia Feil. Also gave Dr. Nena Alexander contact information to Dr. Lutricia Feil for further discussion.  I have been unable to reach patient's spouse.   I will return on Monday and will follow up with patient then if she is still admitted.  Mariana Kaufman, AGNP-C Palliative Medicine  Please call Palliative Medicine team phone with any questions [phone]. For individual providers please see AMION.  Time In: 0900 Time Out:1000 Total Time:60 minutes Prolonged services billed: yes Greater than 50%  of this time was spent counseling and coordinating care related to the above assessment and plan

## 2018-04-04 NOTE — NC FL2 (Signed)
[place] LEVEL OF CARE SCREENING TOOL     IDENTIFICATION  Patient Name: Kelly Mclean Birthdate: 06-07-1955 Sex: female Admission Date (Current Location): 03/28/2018  [facility] and [place] Number:  Engineering geologist and Address:  [facility], [place] [address], [place], [place] [address]      Provider Number: [id number]  Attending Physician Name and Address:  Jonetta Osgood, MD  Relative Name and Phone Number:  [name], spouse, [phone]    Current Level of Care: Hospital Recommended Level of Care: [place] Prior Approval Number:    Date Approved/Denied:   PASRR Number: [id number]  Discharge Plan: SNF    Current Diagnoses: Patient Active Problem List   Diagnosis Date Noted  . Pleural effusion associated with hepatic disorder   . Renal failure   . Chronic liver failure without hepatic coma ([place])   . Advanced care planning/counseling discussion   . Goals of care, counseling/discussion   . Palliative care by specialist   . Cirrhosis ([place]) 03/28/2018  . Acute kidney injury superimposed on chronic kidney disease ([place]) 03/28/2018  . Ascites   . Hydrothorax   . Lymphocytosis 03/23/2018  . Acute respiratory failure ([place]) 03/14/2018    Orientation RESPIRATION BLADDER Height & Weight     Self, Time, Situation, Place  O2(Nasal cannula 3L) Continent Weight: 151 lb 0.2 oz (68.5 kg) Height:  5' 2"  (157.5 cm)  BEHAVIORAL SYMPTOMS/MOOD NEUROLOGICAL BOWEL NUTRITION STATUS      Continent Diet(Please see DC Summary)  AMBULATORY STATUS COMMUNICATION OF NEEDS Skin   Limited Assist Verbally Normal                       Personal Care Assistance Level of Assistance  Bathing, Feeding, Dressing Bathing Assistance: Limited assistance Feeding assistance: Independent Dressing Assistance: Limited assistance     Functional Limitations Info  Sight, Hearing, Speech Sight Info: Impaired Hearing Info: Adequate Speech  Info: Adequate    SPECIAL CARE FACTORS FREQUENCY  PT (By licensed PT), OT (By licensed OT)     PT Frequency: 5x/week OT Frequency: 3x/week            Contractures Contractures Info: Not present    Additional Factors Info  Code Status, Allergies, Psychotropic Code Status Info: DNR Allergies Info: Biaxin Clarithromycin, Penicillins, Zoloft Sertraline Hcl, Milk-related Compounds, Lactose Intolerance (Gi), Other Psychotropic Info: Buspar         Current Medications (04/04/2018):  This is the current hospital active medication list Current Facility-Administered Medications  Medication Dose Route Frequency Provider Last Rate Last Dose  . 0.9 %  sodium chloride infusion (Manually program via Guardrails IV Fluids)   Intravenous Once Bodenheimer, Charles A, NP      . albuterol (PROVENTIL) (2.5 MG/3ML) 0.083% nebulizer solution 2.5 mg  2.5 mg Inhalation Q6H PRN Etta Quill, DO      . busPIRone (BUSPAR) tablet 10 mg  10 mg Oral TID Etta Quill, DO   10 mg at 04/04/18 0955  . calcium carbonate (TUMS - dosed in mg elemental calcium) chewable tablet 400 mg of elemental calcium  2 tablet Oral BID Jennette Kettle M, DO   400 mg of elemental calcium at 04/01/18 0850  . citalopram (CELEXA) tablet 20 mg  20 mg Oral Daily Jennette Kettle M, DO   20 mg at 04/04/18 0955  . docusate (COLACE) 50 MG/5ML liquid 100 mg  100 mg Oral BID Kirby-Graham, Karsten Fells, NP  100 mg at 04/02/18 1030  . famotidine (PEPCID) tablet 10 mg  10 mg Oral QHS Edrick Oh, MD   10 mg at 04/03/18 2246  . fentaNYL (SUBLIMAZE) 100 MCG/2ML injection           . gabapentin (NEURONTIN) capsule 200 mg  200 mg Oral QHS Jennette Kettle M, DO   200 mg at 04/03/18 2247  . lidocaine (XYLOCAINE) 1 % (with pres) injection           . midazolam (VERSED) 2 MG/2ML injection           . midodrine (PROAMATINE) tablet 10 mg  10 mg Oral TID WC Roney Jaffe, MD   10 mg at 04/03/18 1657  . octreotide (SANDOSTATIN) injection 150 mcg  150  mcg Subcutaneous Q8H Roney Jaffe, MD   150 mcg at 04/04/18 0655  . ondansetron (ZOFRAN) tablet 4 mg  4 mg Oral Q6H PRN Etta Quill, DO       Or  . ondansetron [facility]) injection 4 mg  4 mg Intravenous Q6H PRN Etta Quill, DO   4 mg at 04/04/18 1038  . primidone (MYSOLINE) tablet 50 mg  50 mg Oral BID Edrick Oh, MD   50 mg at 04/04/18 7460     Discharge Medications: Please see discharge summary for a list of discharge medications.  Relevant Imaging Results:  Relevant Lab Results:   Additional Information SSN: [place] [address] [place], [place]

## 2018-04-04 NOTE — Progress Notes (Signed)
Patient complaining of Soreness where her drain was placed. Dr. Sloan Leiter verbally ordered 50 mg Tramadol oral once.

## 2018-04-04 NOTE — Progress Notes (Signed)
PROGRESS NOTE        PATIENT DETAILS Name: Kelly Mclean Age: 63 y.o. Sex: female Date of Birth: 1955-11-17 Admit Date: 03/28/2018 Admitting Physician Etta Quill, DO DIY:MEBR, Lemmie Evens, MD  Brief Narrative: Patient is a 63 y.o. 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  See below for further details  Subjective:  Wants to go to residential hospice-denies any chest pain.  Lying comfortably in bed.  No nausea vomiting.   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   Thought to have hepatic hydrothorax-IR consulted for TIPS procedure-but given high meld score-risks may be unacceptable.  IR subsequently placed a Pleurx catheter (for comfort) on 3/13.  Pleural fluid cytology x3- for malignancy.  Decompensated liver cirrhosis: Secondary to NASH.  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  AKI on CKD stage III: Likely hepatorenal syndrome-creatinine has plateaued-given that it could be hepatorenal-high likelihood that renal function will not dramatically improve to lower the meld score any further.  Nephrology was consulted in an attempt to move renal function and hence was started on octreotide, midodrine and IV albumin-however renal function again slowly starting to increase.  Since patient's goal of care is overall comfort-we will go ahead and discontinue octreotide midodrine and IV albumin today.    Anemia: Probably has anemia secondary to liver cirrhosis/CKD-worsened due to acute illness.  No evidence of overt blood loss-denies melena or hematochezia.  Has required 1 unit of PRBC transfusion so far-hemoglobin stable at 9.0.  Follow periodically.    Thrombocytopenia: Suspect related to liver cirrhosis/hypersplenism.  Follow.  Deconditioning: PT evaluation appreciated-home health services on discharge.  Depression/anxiety: Appears stable-continue BuSpar and Celexa.  Palliative care: Unfortunate 63 year old with [name] related liver cirrhosis-now with hepatic hydrothorax and worsening renal function.  Poor prognosis-seen by palliative care-DNR in place-tentative plans were to discharge home with hospice care-however family not able to provide 24/7 care-social worker in the process of seeing if patient can qualify for SNF.  Given that she has slowly progressive disease-not sure if she can actually qualify for residential hospice, unless she acutely decompensates.  DVT Prophylaxis: SCD's  Code Status: DNR  Family Communication: None at bedside-had spoken with spouse yesterday.  Disposition Plan: Remain inpatient-disposition is unclear-either home with hospice care or SNF-social worker following.  Antimicrobial agents: Anti-infectives (From admission, onward)   None      Procedures: 3/13>> Pleurx catheter placement  CONSULTS:  IR  Time spent: 25 minutes-Greater than 50% of this time was spent in counseling, explanation of diagnosis, planning of further management, and coordination of care.  MEDICATIONS: Scheduled Meds:  sodium chloride   Intravenous Once   busPIRone  10 mg Oral TID   calcium carbonate  2 tablet Oral BID   citalopram  20 mg Oral Daily   docusate  100 mg Oral BID   famotidine  10 mg Oral QHS   fentaNYL       gabapentin  200 mg Oral QHS   lidocaine       midazolam       midodrine  10 mg Oral TID WC   octreotide  150 mcg Subcutaneous Q8H   primidone  50 mg Oral  BID   Continuous Infusions:  PRN Meds:.albuterol, ondansetron **OR** ondansetron (ZOFRAN) IV   PHYSICAL EXAM: Vital signs: Vitals:   04/04/18 1230 04/04/18 1235 04/04/18 1245 04/04/18 1302  BP: (!) 124/40 (!) 130/52 (!)  134/57 (!) 102/57  Pulse: 74 75 70 70  Resp: (!) 24 15 19 19   Temp:    98.9 F (37.2 C)  TempSrc:    Oral  SpO2: 100% 100% 100%   Weight:      Height:       Filed Weights   04/02/18 0646 04/03/18 0612 04/04/18 0251  Weight: 67.4 kg 68.3 kg 68.5 kg   Body mass index is 27.62 kg/m.   General appearance:Awake, alert, not in any distress.  Frail and deconditioned Eyes:no scleral icterus. HEENT: Atraumatic and Normocephalic Neck: supple, no JVD. Resp: Creased air entry in the left-no added sounds CVS: S1 S2 regular, no murmurs.  GI: Bowel sounds present, Non tender and not distended with no gaurding, rigidity or rebound. Extremities: B/L Lower Ext shows no edema, both legs are warm to touch Neurology:  Non focal Psychiatric: Normal judgment and insight. Normal mood. Musculoskeletal:No digital cyanosis Skin:No Rash, warm and dry Wounds:N/A  I have personally reviewed following labs and imaging studies  LABORATORY DATA: CBC: Recent Labs  Lab 03/28/18 2130 03/30/18 0550 03/31/18 0208 04/01/18 0322 04/02/18 0330  WBC 4.9 2.4* 1.9* 1.9* 2.2*  HGB 9.1* 7.4* 6.9* 7.9* 9.0*  HCT 27.3* 22.3* 21.2* 24.1* 27.8*  MCV 101.5* 102.3* 101.9* 101.3* 101.1*  PLT 81* 54* 49* 48* 53*    Basic Metabolic Panel: Recent Labs  Lab 03/30/18 0550 03/31/18 0208 04/01/18 0322 04/02/18 0330 04/04/18 0408  NA 135 134* 138 141 141  K 4.3 4.9 4.6 4.5 4.3  CL 107 107 107 109 103  CO2 24 22 24 27 22   GLUCOSE 79 132* 105* 88 82  BUN 44* 38* 33* 34* 37*  CREATININE 2.06* 1.94* 1.96* 2.27* 2.37*  CALCIUM 8.8* 9.0 9.8 10.2 10.6*  PHOS  --   --   --  3.5  --     GFR: Estimated Creatinine Clearance: 22.6 mL/min (A) (by C-G formula based on SCr of 2.37 mg/dL (H)).  Liver Function Tests: Recent Labs  Lab 03/28/18 2130 03/30/18 0550 04/01/18 0322 04/02/18 0330  AST 43* 30 28 24   ALT 31 24 18 16   ALKPHOS 87 61 34* 41  BILITOT 1.2 1.3* 2.0* 1.8*  PROT 5.9* 5.5* 6.3* 6.3*  ALBUMIN 2.3*  3.0* 5.0 4.8   No results for input(s): LIPASE, AMYLASE in the last 168 hours. No results for input(s): AMMONIA in the last 168 hours.  Coagulation Profile: Recent Labs  Lab 04/02/18 0330  INR 1.7*    Cardiac Enzymes: No results for input(s): CKTOTAL, CKMB, CKMBINDEX, TROPONINI in the last 168 hours.  BNP (last 3 results) No results for input(s): PROBNP in the last 8760 hours.  HbA1C: No results for input(s): HGBA1C in the last 72 hours.  CBG: No results for input(s): GLUCAP in the last 168 hours.  Lipid Profile: No results for input(s): CHOL, HDL, LDLCALC, TRIG, CHOLHDL, LDLDIRECT in the last 72 hours.  Thyroid Function Tests: No results for input(s): TSH, T4TOTAL, FREET4, T3FREE, THYROIDAB in the last 72 hours.  Anemia Panel: No results for input(s): VITAMINB12, FOLATE, FERRITIN, TIBC, IRON, RETICCTPCT in the last 72 hours.  Urine analysis:    Component Value Date/Time   COLORURINE YELLOW 03/29/2018 2058   APPEARANCEUR CLEAR 03/29/2018 2058   LABSPEC  1.012 03/29/2018 2058   PHURINE 5.0 03/29/2018 2058   GLUCOSEU NEGATIVE 03/29/2018 2058   HGBUR LARGE (A) 03/29/2018 2058   BILIRUBINUR NEGATIVE 03/29/2018 2058   KETONESUR NEGATIVE 03/29/2018 2058   PROTEINUR NEGATIVE 03/29/2018 2058   NITRITE NEGATIVE 03/29/2018 2058   LEUKOCYTESUR TRACE (A) 03/29/2018 2058    Sepsis Labs: Lactic Acid, Venous No results found for: LATICACIDVEN  MICROBIOLOGY: No results found for this or any previous visit (from the past 240 hour(s)).  RADIOLOGY STUDIES/RESULTS: Ct Abdomen Pelvis Wo Contrast  Result Date: 03/20/2018 CLINICAL DATA:  History of cirrhosis of liver, dyspnea. Hepatic hydrothorax. Fluid with lymphocytes requiring rule out lymphoma. EXAM: CT ABDOMEN AND PELVIS WITHOUT CONTRAST TECHNIQUE: Multidetector CT imaging of the abdomen and pelvis was performed following the standard protocol without IV contrast. COMPARISON:  CT abdomen dated 03/06/2018 FINDINGS: Lower chest:  Again noted is a large LEFT pleural effusion, incompletely imaged. Lingular consolidation is likely associated atelectasis. Hepatobiliary: Cirrhotic appearing liver. Gallstones are present within the nondistended gallbladder. No bile duct dilatation seen. Pancreas: Unremarkable. No pancreatic ductal dilatation or surrounding inflammatory changes. Spleen: Normal in size without focal abnormality. Adrenals/Urinary Tract: Kidneys are unremarkable without mass, stone or hydronephrosis. No perinephric fluid. No ureteral or bladder calculi identified. Bladder is obscured by overlying ascites. Stomach/Bowel: No dilated large or small bowel loops. 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 Appendix is normal. Stomach is unremarkable. Vascular/Lymphatic: Aortic atherosclerosis. Enlarged lymph node adjacent to the pancreatic head, 1.6 cm, better seen on the recent contrast-enhanced CT of 03/06/2018. No other enlarged lymph nodes appreciated in the abdomen or pelvis. Reproductive: Uterus appears normal. Ascites obscures visualization of the bilateral adnexa. Other: Moderate amount of ascites within the abdomen and pelvis, largest component within the pelvis. No evidence of circumscribed fluid collection or abscess. No free intraperitoneal air. Musculoskeletal: No acute or suspicious osseous finding. Superficial soft tissues are unremarkable. IMPRESSION: 1. Cirrhotic liver. 2. Moderate amount of ascites within the abdomen and pelvis, largest component within the pelvis. 3. Enlarged lymph node adjacent to the pancreatic head, measuring 1.6 cm, better seen on the recent contrast-enhanced CT of 03/06/2016, favor reactive over neoplastic etiology given the absence of additional lymphadenopathy. 4. Large LEFT pleural effusion, incompletely imaged. 5. Cholelithiasis without evidence of acute cholecystitis. Aortic Atherosclerosis (ICD10-I70.0). Electronically Signed   By: Franki Cabot M.D.   On: 03/20/2018 14:20   Dg Chest 1  View  Result Date: 03/30/2018 CLINICAL DATA:  Status post thoracentesis. EXAM: CHEST  1 VIEW COMPARISON:  03/27/2017 FINDINGS: Midline trachea. Normal heart size. Small right pleural effusion is suspected. The left-sided effusion has decreased. No pneumothorax. Improved aeration with decreased left-sided atelectasis. Mild subsegmental atelectasis remains at both lung bases. IMPRESSION: Decreased left pleural effusion, without pneumothorax. Improved aeration with mild bibasilar atelectasis remaining. Electronically Signed   By: Abigail Miyamoto M.D.   On: 03/30/2018 13:47   Dg Chest 1 View  Result Date: 03/26/2018 CLINICAL DATA:  Shortness of breath. EXAM: CHEST  1 VIEW COMPARISON:  Multiple prior exams most recent radiograph 03/24/2018. Most recent CT 03/14/2018 FINDINGS: Known left pleural effusion has increased in size over the past 2 days, now large in size. Associated compressive atelectasis throughout the left lung. Mild rightward mediastinal shift versus rotation. Only a small portion of aerated left perihilar lung. Hypoventilatory right lung without acute finding. No visualized pneumothorax. IMPRESSION: Increased size of known left pleural effusion, now large in size. This causes mild mass effect in the mediastinum with rightward mediastinal shift. Electronically  Signed   By: Keith Rake M.D.   On: 03/26/2018 19:37   Dg Chest 1 View  Result Date: 03/14/2018 CLINICAL DATA:  Status post thoracentesis EXAM: CHEST  1 VIEW COMPARISON:  March 14, 2018 12:56 p.m. FINDINGS: The heart size and mediastinal contours are within normal limits. There is small left pleural effusion significantly decreased compared prior exam. Consolidation left lung base is noted. There is no pneumothorax. The right lung is clear. The visualized skeletal structures are stable. IMPRESSION: There is small left pleural effusion, significantly decreased compared prior exam. Consolidation of left lung base is identified. There is no  pneumothorax. Electronically Signed   By: Abelardo Diesel M.D.   On: 03/14/2018 16:19   Dg Chest 2 View  Result Date: 04/02/2018 CLINICAL DATA:  Shortness of breath EXAM: CHEST - 2 VIEW COMPARISON:  Three days ago FINDINGS: Large and significantly increased left pleural effusion. Borderline cardiopericardial enlargement, distorted by pleural fluid. Streaky density at the medial right base also seen previously. IMPRESSION: Large left pleural effusion with significant re-accumulation since 3 days ago. Electronically Signed   By: Monte Fantasia M.D.   On: 04/02/2018 09:39   Ct Chest Wo Contrast  Result Date: 03/14/2018 CLINICAL DATA:  Pleural effusion and dyspnea since yesterday. EXAM: CT CHEST WITHOUT CONTRAST TECHNIQUE: Multidetector CT imaging of the chest was performed following the standard protocol without IV contrast. COMPARISON:  03/14/2018 CXR FINDINGS: Cardiovascular: Common arterial branch of the right brachiocephalic left common carotid arteries. Atherosclerosis of left subclavian artery origin. Nonaneurysmal atherosclerotic aorta. The unenhanced pulmonary vessels are unremarkable. The heart size is normal with small anterior pericardial effusion without thickening noted. Mediastinum/Nodes: Small subcentimeter prevascular and paratracheal lymph nodes. 1 cm short axis subcarinal lymph node. Patent trachea and mainstem bronchi. Unremarkable CT appearance of the esophagus. The thyroid gland is unremarkable without dominant mass. Lungs/Pleura: Moderate to large layering left effusion with adjacent atelectasis. The right lung is clear. No pneumothorax or dominant mass. Upper Abdomen: Cirrhotic appearance of the liver with moderate to large volume of ascites. Musculoskeletal: No chest wall mass or suspicious bone lesions identified. Mild thoracic spondylosis. IMPRESSION: 1. Moderate to large layering left pleural effusion with adjacent atelectasis. 2. Cirrhotic liver with moderate to large volume of  ascites. Aortic Atherosclerosis (ICD10-I70.0). Electronically Signed   By: Ashley Royalty M.D.   On: 03/14/2018 22:17   [place] Intraoperative  Result Date: 03/19/2018 INDICATION: 63 year old with cirrhosis and recurrent left hydrothorax. Plan for nuclear medicine ascites - hydrothorax examination. Plan for an ultrasound-guided paracentesis and injection of radiopharmaceutical into the ascites. Left thoracentesis was performed prior to this procedure. EXAM: ULTRASOUND GUIDED PARACENTESIS INJECTION OF RADIOPHARMACEUTICAL INTO ASCITES MEDICATIONS: None. COMPLICATIONS: None immediate. PROCEDURE: Informed written consent was obtained from the patient after a discussion of the risks, benefits and alternatives to treatment. A timeout was performed prior to the initiation of the procedure. Initial ultrasound scanning demonstrates a small amount of ascites within the right upper abdominal quadrant. The right upper abdomen was prepped and draped in the usual sterile fashion. 1% lidocaine with was used for local anesthesia. Following this, a 6 Fr Safe-T-Centesis catheter was introduced. An ultrasound image was saved for documentation purposes. The paracentesis was performed. 60 mL of opaque yellow fluid was removed. The radiopharmaceutical was injected through the Safe-T-Centesis catheter. Catheter was flushed with sterile saline. The catheter was removed and a dressing was applied. The patient tolerated the procedure well without immediate post procedural complication. FINDINGS: Small amount of fluid around the  liver. Small amount of fluid in the left lower quadrant. 60 mL of fluid was removed from the right upper quadrant. The radiopharmaceutical was injected into the right upper quadrant. IMPRESSION: Successful ultrasound-guided paracentesis yielding 60 mL of ascites. Fluid was sent for labs. Radiopharmaceutical was injected through the paracentesis catheter for the nuclear medicine examination. Electronically Signed   By:  Markus Daft M.D.   On: 03/19/2018 17:06   Ir Guided Niel Hummer W Catheter Placement  Result Date: 04/04/2018 INDICATION: 63 year old female with cirrhosis complicated by ascites and left hepatic hydrothorax. Her renal and hepatic function has been fluctuating in her meld has been elevated between 20 in 23. The risks, benefits and alternatives to creation of a an elective TIPS versus placement of a tunneled pleural drainage catheter were discussed with the patient. At this time, she prefers tunneled pleural drainage catheter and hospice rather than proceeding with a TIPS given the relatively high rate of mortality for meld above 19. She presents for tunneled pleural drainage catheter. EXAM: ABSCESS DRAINAGE MEDICATIONS: The patient is currently admitted to the hospital and receiving intravenous antibiotics. The antibiotics were administered within an appropriate time frame prior to the initiation of the procedure. ANESTHESIA/SEDATION: Fentanyl 50 mcg IV; Versed 1 mg IV Moderate Sedation Time:  23 minutes The patient was continuously monitored during the procedure by the interventional radiology nurse under my direct supervision. COMPLICATIONS: None immediate. PROCEDURE: Informed written consent was obtained from the patient after a thorough discussion of the procedural risks, benefits and alternatives. All questions were addressed. Maximal Sterile Barrier Technique was utilized including caps, mask, sterile gowns, sterile gloves, sterile drape, hand hygiene and skin antiseptic. A timeout was performed prior to the initiation of the procedure. The left chest was interrogated with ultrasound. There is a large pleural effusion. A suitable skin entry site was selected and marked. Local anesthesia was attained by infiltration with 1% lidocaine. A small dermatotomy was made. Under real-time sonographic guidance, an 18 gauge needle was advanced into the pleural space. A wire was subsequently advanced into the pleural space. A  suitable skin exit site was selected slightly inferior and medial to the pleural entry site. Local anesthesia was again attained by infiltrating 1% lidocaine and a second small dermatotomy was made. The PleurX tunneled drainage catheter was then tunneled from the skin exit site to the dermatotomy overlying the pleural access site. A peel-away sheath was then advanced over a wire and into the left pleural space. The catheter was advanced through the peel-away sheath and into the pleural space. The catheter was then connected to vacuum tender bottles and a total of 2.2 L was removed. The dermatotomy overlying the pleural entry site was closed with an interrupted inverted subdermal Vicryl suture and then sealed with Dermabond. The catheter was secured to the skin with 0 silk suture. Sterile bandages were placed. IMPRESSION: Successful placement of a left tunneled pleural drainage catheter with aspiration of 2.2 L of pleural fluid. Electronically Signed   By: Jacqulynn Cadet M.D.   On: 04/04/2018 13:07   US Renal  Result Date: 03/30/2018 CLINICAL DATA:  Renal failure.  Cirrhosis. EXAM: RENAL / URINARY TRACT ULTRASOUND COMPLETE COMPARISON:  CT scan March 20, 2018 FINDINGS: Right Kidney: Renal measurements: 9 x 4.6 x 5.3 cm = volume: 114 mL. Contain in upper pole levin mm complex cysts. Contains a 7 mm lower pole cyst. Increased cortical echogenicity. Left Kidney: Renal measurements: 7.9 x 3.8 x 4.7 cm = volume: 73 mL. Increased cortical echogenicity.  Bladder: Appears normal for degree of bladder distention. IMPRESSION: 1. Two small cysts in the right kidney, 1 of which is mildly complex. Medical renal disease with increased cortical echogenicity. No hydronephrosis. 2. Nodular liver consistent with cirrhosis.  Ascites. 3. The spleen is prominent measuring up to 13.2 cm. However, this volume of 357 cc does not meet the criteria for splenomegaly. Electronically Signed   By: Dorise Bullion III M.D   On: 03/30/2018  16:13   Nm Interstitial Rad Source Applic Complex  Result Date: 03/19/2018 CLINICAL DATA:  63 year old with cirrhosis and recurrent left hydrothorax. Patient is being evaluated for a hepatic hydrothorax. EXAM: NUCLEAR MEDICINE SULFUR COLLOID PERITONEAL SCINTIGRAPHY TECHNIQUE: Ultrasound-guided left thoracentesis was performed. 1.1 L of left pleural fluid was removed but not all of the fluid was removed. Subsequently, an ultrasound-guided paracentesis was performed in the right upper quadrant. 60 mL of peritoneal fluid was removed. Technetium 99 M sulfur colloid was injected through the paracentesis catheter. Paracentesis catheter was removed. RADIOPHARMACEUTICALS:  7 millicuries technetium 99 M sulfur colloid COMPARISON:  Chest CT 03/14/2018 FINDINGS: Imaging was obtained over 1 hour. The initial images demonstrated radiopharmaceutical throughout the peritoneal cavity. The largest peritoneal pockets are in the right upper quadrant and left lower quadrant. Over time, radiopharmaceutical starts to accumulate in the left hemithorax. Findings are compatible with a trans diaphragmatic connection between the peritoneal cavity and the left pleural space. IMPRESSION: Study is positive for connection between the peritoneal space and left pleural space. 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 Electronically Signed   By: Markus Daft M.D.   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 Plan for nuclear medicine ascites - hydrothorax examination. Plan for an ultrasound-guided paracentesis and injection of radiopharmaceutical into the ascites. Left thoracentesis was performed prior to this procedure. EXAM: ULTRASOUND GUIDED PARACENTESIS INJECTION OF RADIOPHARMACEUTICAL INTO ASCITES MEDICATIONS: None. COMPLICATIONS: None immediate. PROCEDURE: Informed written consent was obtained from the patient after a  discussion of the risks, benefits and alternatives to treatment. A timeout was performed prior to the initiation of the procedure. Initial ultrasound scanning demonstrates a small amount of ascites within the right upper abdominal quadrant. The right upper abdomen was prepped and draped in the usual sterile fashion. 1% lidocaine with was used for local anesthesia. Following this, a 6 Fr Safe-T-Centesis catheter was introduced. An ultrasound image was saved for documentation purposes. The paracentesis was performed. 60 mL of opaque yellow fluid was removed. The radiopharmaceutical was injected through the Safe-T-Centesis catheter. Catheter was flushed with sterile saline. The catheter was removed and a dressing was applied. The patient tolerated the procedure well without immediate post procedural complication. FINDINGS: Small amount of fluid around the liver. Small amount of fluid in the left lower quadrant. 60 mL of fluid was removed from the right upper quadrant. The radiopharmaceutical was injected into the right upper quadrant. IMPRESSION: Successful ultrasound-guided paracentesis yielding 60 mL of ascites. Fluid was sent for labs. Radiopharmaceutical was injected through the paracentesis catheter for the nuclear medicine examination. Electronically Signed   By: Markus Daft M.D.   On: 03/19/2018 17:06   Ct Abdomen W Wo Contrast  Result Date: 03/06/2018 CLINICAL DATA:  Pancreatic mass seen on earlier [place]. Pancreatic mass workup . Cirrhosis. Epigastric and LUQ pain. 59m of Omni 300 used. Pt could only tolerate laying on her left side for scan.^102mOMNIPAQUE IOHEXOL 300 MG/ML SOLNPancreatic mass EXAM: CT  ABDOMEN WITHOUT AND WITH CONTRAST TECHNIQUE: Multidetector CT imaging of the abdomen was performed following the standard protocol before and following the bolus administration of intravenous contrast. CONTRAST:  111m OMNIPAQUE IOHEXOL 300 MG/ML  SOLN COMPARISON:  Ultrasound 03/04/2010 FINDINGS: Lower chest:   Large LEFT pleural effusion with passive atelectasis. Hepatobiliary: Nodular liver. Caudate lobe is enlarged. Portal veins are patent. Multiple gallstones within lumen gallbladder. No enhancing hepatic lesion. No intrahepatic or extrahepatic biliary duct dilatation. Pancreas: No abnormality of the pancreatic head body or tail. No duct dilatation. Periampullary duodenum diverticulum noted. Lymph node position inferior to the pancreas adjacent to the portal vein measures 16 mm (image 49/4) Spleen: Spleen borderline enlarged. Adrenals/urinary tract: LEFT kidney is normal. RIGHT kidney normal. No adrenal abnormality Stomach/Bowel: Stomach and limited of the small bowel is unremarkable Vascular/Lymphatic: Abdominal aortic normal caliber. No retroperitoneal periportal lymphadenopathy. Musculoskeletal: No aggressive osseous lesion IMPRESSION: 1. No pancreatic lesion identified. 2. Peripancreatic lymph node is mildly enlarged. 3. Small periampullary duodenum diverticulum present. 4. Morphologic changes in liver consistent with cirrhosis. No biliary duct dilatation of the intrahepatic or extrahepatic bile ducts. 5. No enhancing hepatic lesion. 6. Cholelithiasis. 7. Moderate volume of intraperitoneal free fluid. 8. Large LEFT pleural effusion Electronically Signed   By: SSuzy BouchardM.D.   On: 03/06/2018 16:31   Dg Bone Density (dxa)  Result Date: 03/11/2018 EXAM: DUAL X-RAY ABSORPTIOMETRY (DXA) FOR BONE MINERAL DENSITY IMPRESSION: Dear Dr, RLutricia Feil Your patient LShelitha Magleycompleted a FRAX assessment on 03/11/2018 using the LB[place](analysis version: 14.10) manufactured by [employer] The following summarizes the results of our evaluation. PATIENT BIOGRAPHICAL: Name: LBrentley, HorrellPatient ID: [id number] Date: 003-07-1957Height:    62.5 in. Gender:     Female    [age]       Weight:    158.4 lbs. Ethnicity:  White                            Exam Date: 03/11/2018 FRAX*  RESULTS:  (version: 3.5) 10-year Probability of Fracture1 Major Osteoporotic Fracture2 Hip Fracture 8.1% 0.7% Population: [place] Risk Factors: None Based on Femur (Left) Neck BMD 1 -The 10-year probability of fracture may be lower than reported if the patient has received treatment. 2 -Major Osteoporotic Fracture: Clinical Spine, Forearm, Hip or Shoulder *FRAX is a tMaterials engineerof the UState [employer] Corporationof SWalt Disneyfor Metabolic Bone Disease, a [place](WHO) [facility] ASSESSMENT: The probability of a major osteoporotic fracture is 8.1% within the next ten years. The probability of a hip fracture is 0.7% within the next ten years. . Technologist: SCE PATIENT BIOGRAPHICAL: Name: LDameisha, TschidaPatient ID: [id number] Date: [birth year]Height: 62.5 in. Gender: Female Exam Date: 03/11/2018 Weight: 158.4 lbs. Indications: Asthma, Caucasian, Cirrhosis, Early Menopause, Postmenopausal, Previous Smoker Fractures: Treatments: Gabapentin, primidone ASSESSMENT: The BMD measured at Femur Neck Left is 0.833 g/cm2 with a T-score of -1.5. This patient is considered osteopenic according to [place]([facility] criteria. The quality of the scan is good. Site Region Measured Measured WHO Young Adult BMD Date       Age      Classification T-score AP Spine L1-L4 03/11/2018 [age] Osteopenia -1.5 1.007 g/cm2 DualFemur Neck Left 03/11/2018 61.0 Osteopenia -1.5 0.833 g/cm2 World Health Organization ([facility] criteria for post-menopausal, Caucasian Women: Normal:       T-score at or above -1  SD Osteopenia:   T-score between -1 and -2.5 SD Osteoporosis: T-score at or below -2.5 SD RECOMMENDATIONS: 1. All patients should optimize calcium and vitamin D intake. 2. Consider FDA-approved medical therapies in postmenopausal women and men aged 6 years and older, based on the following: a. A hip or vertebral(clinical or morphometric) fracture b. T-score < -2.5 at the femoral neck or  spine after appropriate evaluation to exclude secondary causes c. Low bone mass (T-score between -1.0 and -2.5 at the femoral neck or spine) and a 10-year probability of a hip fracture > 3% or a 10-year probability of a major osteoporosis-related fracture > 20% based on the [place]-adapted WHO algorithm d. Clinician judgment and/or patient preferences may indicate treatment for people with 10-year fracture probabilities above or below these levels FOLLOW-UP: People with diagnosed cases of osteoporosis or at high risk for fracture should have regular bone mineral density tests. For patients eligible for Medicare, routine testing is allowed once every 2 years. The testing frequency can be increased to one year for patients who have rapidly progressing disease, those who are receiving or discontinuing medical therapy to restore bone mass, or have additional risk factors. I have reviewed this report, and agree with the above findings. [facility] Radiology Electronically Signed   By: Lowella Grip III M.D.   On: 03/11/2018 11:23   Dg Chest Port 1 View  Result Date: 03/28/2018 CLINICAL DATA:  Recurrent effusion EXAM: PORTABLE CHEST 1 VIEW COMPARISON:  03/27/2018, 03/26/2018, 03/24/2018 FINDINGS: Interval diffuse hazy opacity left thorax likely due to layering effusion, moderate in size. This is increased compared to prior. Underlying edema not excluded. Stable cardiomediastinal silhouette. Left basilar airspace disease. IMPRESSION: Increased moderate left pleural effusion with development of diffuse hazy opacity in the left thorax which may reflect layering fluid and or edema. Increased airspace disease at the left base. Electronically Signed   By: Donavan Foil M.D.   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 EXAM: PORTABLE CHEST 1 VIEW COMPARISON:  Chest radiograph-03/26/2018 FINDINGS:  Grossly unchanged cardiac silhouette and mediastinal contours. Interval reduction in persistent moderate size left-sided effusion post thoracentesis. No pneumothorax. Improved aeration of the left mid and lower lung with persistent left basilar heterogeneous/consolidative opacities. No new focal airspace opacities. No definite right-sided pleural effusion. No evidence of edema. No acute osseous abnormalities. IMPRESSION: 1. Interval reduction in persistent moderate size left-sided effusion post thoracentesis. No pneumothorax. 2. Improved aeration of the left lung base with persistent left basilar atelectasis. Electronically Signed   By: Sandi Mariscal M.D.   On: 03/27/2018 13:34   Dg Chest Port 1 View  Result Date: 03/24/2018 CLINICAL DATA:  Post left thoracentesis EXAM: PORTABLE CHEST 1 VIEW COMPARISON:  03/21/2018 FINDINGS: Moderate to large left pleural effusion minimally changed since prior study. No pneumothorax. Left basilar opacity, likely atelectasis. No confluent opacity on the right. Heart is upper limits normal in size. IMPRESSION: Moderate to large-sized left pleural effusion minimally changed. No pneumothorax. Continued left base atelectasis or infiltrate. Electronically Signed   By: Rolm Baptise M.D.   On: 03/24/2018 12:46   Dg Chest Port 1 View  Result Date: 03/19/2018 CLINICAL DATA:  Status post thoracentesis. EXAM: PORTABLE CHEST 1 VIEW COMPARISON:  03/14/2018 FINDINGS: 1702 hours. Moderate to large left pleural effusion noted without evidence for pneumothorax. Left base collapse/consolidation. The cardio pericardial silhouette is enlarged. The visualized bony structures  of the thorax are intact. Telemetry leads overlie the chest. IMPRESSION: 1. No evidence for pneumothorax status post thoracentesis. 2. Moderate to large left pleural effusion. Electronically Signed   By: Misty Stanley M.D.   On: 03/19/2018 18:56   Dg Chest Portable 1 View  Result Date: 03/14/2018 CLINICAL DATA:  Shortness of  breath. EXAM: PORTABLE CHEST 1 VIEW COMPARISON:  Chest x-ray 03/06/2018. FINDINGS: Mediastinum and hilar structures normal. Large left pleural effusion, increased in size from prior exam. Underlying left lung atelectasis/infiltrate can not be excluded. No pneumothorax. Heart size most likely stable. IMPRESSION: Large left pleural effusion, increased from prior exam. Electronically Signed   By: Marcello Moores  Register   On: 03/14/2018 13:19   Dg Chest Port 1 View  Result Date: 03/06/2018 CLINICAL DATA:  Post large volume left-sided thoracentesis. EXAM: PORTABLE CHEST 1 VIEW COMPARISON:  Chest radiograph-02/20/2018; CT abdomen pelvis-earlier same day FINDINGS: Interval reduction in persistent moderate size left-sided effusion post large volume thoracentesis. No pneumothorax. Improved aeration of left lung base with persistent left basilar heterogeneous/consolidative opacities. No definite evidence of right-sided pleural effusion. No new focal airspace opacities. Unchanged cardiac silhouette and mediastinal contours. No acute osseous abnormalities. IMPRESSION: Interval reduction in persistent moderate sized left-sided effusion post thoracentesis. No pneumothorax. Electronically Signed   By: Sandi Mariscal M.D.   On: 03/06/2018 16:02   Mm 3d Screen Breast Bilateral  Result Date: 03/12/2018 CLINICAL DATA:  Screening. EXAM: DIGITAL SCREENING BILATERAL MAMMOGRAM WITH TOMO AND CAD COMPARISON:  Previous exam(s). ACR Breast Density Category b: There are scattered areas of fibroglandular density. FINDINGS: There are no findings suspicious for malignancy. Images were processed with CAD. IMPRESSION: No mammographic evidence of malignancy. A result letter of this screening mammogram will be mailed directly to the patient. RECOMMENDATION: Screening mammogram in one year. (Code:SM-B-01Y) BI-RADS CATEGORY  1: Negative. Electronically Signed   By: Dorise Bullion III M.D   On: 03/12/2018 14:44   Nm Hepato Biliary Leak  Result Date:  03/17/2018 CLINICAL DATA:  Cholelithiasis. Nausea and vomiting. Hepatic cirrhosis. EXAM: NUCLEAR MEDICINE HEPATOBILIARY IMAGING TECHNIQUE: Sequential images of the abdomen were obtained out to 60 minutes following intravenous administration of radiopharmaceutical. RADIOPHARMACEUTICALS:  5.1 mCi Tc-64m Choletec IV COMPARISON:  CT on 03/14/2018 FINDINGS: Delayed uptake and biliary excretion of activity by the liver is seen, consistent with known hepatic cirrhosis. Gallbladder activity is visualized, consistent with patency of cystic duct. Biliary activity passes into small bowel, consistent with patent common bile duct. IMPRESSION: No evidence of acute cholecystitis or biliary ductal dilatation. Hepatocellular dysfunction, consistent with hepatic cirrhosis. Electronically Signed   By: JEarle GellM.D.   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 Request for therapeutic thoracentesis today in IR. EXAM: ULTRASOUND GUIDED LEFT THORACENTESIS MEDICATIONS: 10 mL 1% lidocaine. COMPLICATIONS: None immediate. PROCEDURE: An ultrasound guided thoracentesis was thoroughly discussed with the patient and questions answered. The benefits, risks, alternatives and complications were also discussed. The patient understands and wishes to proceed with the procedure. Written consent was obtained. Ultrasound was performed to localize and mark an adequate pocket of fluid in the left chest. The area was then prepped and draped in the normal sterile fashion. 1% Lidocaine was used for local anesthesia. Under ultrasound guidance a 6 Fr Safe-T-Centesis catheter was introduced. Thoracentesis was performed. The catheter was removed and a dressing applied. FINDINGS: A total of approximately 2.1  L of chylous fluid was removed. IMPRESSION: Successful ultrasound guided left thoracentesis  yielding 2.1 L of pleural fluid. Read by Candiss Norse, PA-C Electronically Signed   By: Jerilynn Mages.  Shick M.D.   On: 03/30/2018 12:54   US Thoracentesis Asp Pleural Space W/img Guide  Result Date: 03/27/2018 INDICATION: History of left-sided hepatic hydrothorax now with recurrent symptomatic left-sided pleural effusion. Please perform ultrasound-guided thoracentesis for therapeutic purposes. EXAM: US THORACENTESIS ASP PLEURAL SPACE W/IMG GUIDE COMPARISON:  Chest radiograph-03/26/2018 MEDICATIONS: None. COMPLICATIONS: None immediate. TECHNIQUE: Informed written consent was obtained from the patient after a discussion of the risks, benefits and alternatives to treatment. A timeout was performed prior to the initiation of the procedure. Initial ultrasound scanning demonstrates a large minimally complex though predominantly anechoic left-sided pleural effusion. The lower chest was prepped and draped in the usual sterile fashion. 1% lidocaine was used for local anesthesia. An ultrasound image was saved for documentation purposes. An 8 Fr Safe-T-Centesis catheter was introduced. The thoracentesis was performed. The catheter was removed and a dressing was applied. The patient tolerated the procedure well without immediate post procedural complication. The patient was escorted to have an upright chest radiograph. FINDINGS: A total of approximately 2.2 liters of serous fluid was removed. IMPRESSION: Successful ultrasound-guided left sided thoracentesis yielding 2.2 liters of pleural fluid. Electronically Signed   By: Sandi Mariscal M.D.   On: 03/27/2018 13:37   US Thoracentesis Asp Pleural Space W/img Guide  Result Date: 03/24/2018 INDICATION: Patient with history of hepatic hydrothorax, cirrhosis and recurrent left pleural effusion who presents today originally for paracentesis in order to obtain flow cytometry to r/o malignancy prior to possible TIPS procedure. Limited abdominal US shows scant peritoneal fluid not amenable  to percutaneous drainage. Diagnostic and therapeutic thoracentesis performed in order to obtain requested labs in lieu of paracentesis given patient's history of hepatic hydrothorax. EXAM: ULTRASOUND GUIDED LEFT THORACENTESIS MEDICATIONS: 20 mL 1% lidocaine. COMPLICATIONS: None immediate. PROCEDURE: An ultrasound guided thoracentesis was thoroughly discussed with the patient and questions answered. The benefits, risks, alternatives and complications were also discussed. The patient understands and wishes to proceed with the procedure. Written consent was obtained. Ultrasound was performed to localize and mark an adequate pocket of fluid in the left chest. The area was then prepped and draped in the normal sterile fashion. 1% Lidocaine was used for local anesthesia. Under ultrasound guidance a 6 Fr Safe-T-Centesis catheter was introduced. Thoracentesis was performed. The catheter was removed and a dressing applied. FINDINGS: A total of approximately 1.1 L of pink milky fluid was removed. Samples were sent to the laboratory as requested by the clinical team. IMPRESSION: Successful ultrasound guided left thoracentesis yielding 1.1 L of pleural fluid. Read by Candiss Norse, PA-C Electronically Signed   By: Lucrezia Europe M.D.   On: 03/24/2018 12:49   US Thoracentesis Asp Pleural Space W/img Guide  Result Date: 03/19/2018 INDICATION: 63 year old with cirrhosis and recurrent left hydrothorax. Patient is being evaluated for a connection between the peritoneal and left pleural space with a nuclear medicine examination. Patient has a large amount of left pleural fluid on ultrasound examination. Plan to remove some of the left pleural fluid prior to the nuclear medicine examination. EXAM: ULTRASOUND GUIDED LEFT THORACENTESIS MEDICATIONS: None. COMPLICATIONS: None immediate. PROCEDURE: An ultrasound guided thoracentesis was thoroughly discussed with the patient and questions answered. The benefits, risks, alternatives and  complications were also discussed. The patient understands and wishes to proceed with the procedure. Written consent was obtained. Ultrasound was performed to  localize and mark an adequate pocket of fluid in the left chest. The area was then prepped and draped in the normal sterile fashion. 1% Lidocaine was used for local anesthesia. Under ultrasound guidance a 6 Fr Safe-T-Centesis catheter was introduced. Thoracentesis was performed. The catheter was removed and a dressing applied. FINDINGS: A total of approximately 1.1 L of opaque yellow fluid was removed. Samples were sent to the laboratory as requested by the clinical team. IMPRESSION: Successful ultrasound guided left thoracentesis yielding 1.1 L of pleural fluid. Electronically Signed   By: Markus Daft M.D.   On: 03/19/2018 17:11   US Thoracentesis Asp Pleural Space W/img Guide  Result Date: 03/14/2018 CLINICAL DATA:  Recurrent left pleural effusion. EXAM: ULTRASOUND GUIDED LEFT THORACENTESIS COMPARISON:  None. PROCEDURE: An ultrasound guided thoracentesis was thoroughly discussed with the patient and questions answered. The benefits, risks, alternatives and complications were also discussed. The patient understands and wishes to proceed with the procedure. Written consent was obtained. Ultrasound was performed to localize and mark an adequate pocket of fluid in the left chest. The area was then prepped and draped in the normal sterile fashion. 1% Lidocaine was used for local anesthesia. Under ultrasound guidance a 6 French Safe-T-Centesis catheter was introduced. Thoracentesis was performed. The catheter was removed and a dressing applied. COMPLICATIONS: None FINDINGS: A total of approximately 2.3 L of cloudy, yellowish-brown fluid was removed. A fluid sample was sent for laboratory analysis. IMPRESSION: Successful ultrasound guided left thoracentesis yielding 2.3 L of pleural fluid. Electronically Signed   By: Aletta Edouard M.D.   On: 03/14/2018 16:13     US Thoracentesis Asp Pleural Space W/img Guide  Result Date: 03/06/2018 INDICATION: History of cirrhosis now with recurrent symptomatic left-sided pleural effusion worrisome for hepatic hydrothorax. Please from ultrasound-guided thoracentesis for therapeutic purposes. EXAM: US THORACENTESIS ASP PLEURAL SPACE W/IMG GUIDE COMPARISON:  Ultrasound-guided thoracentesis-03/22/2018 (yielding 2.2 L of pleural fluid); CT abdomen pelvis-earlier same day MEDICATIONS: None. COMPLICATIONS: None immediate. TECHNIQUE: Informed written consent was obtained from the patient after a discussion of the risks, benefits and alternatives to treatment. A timeout was performed prior to the initiation of the procedure. Initial ultrasound scanning demonstrates a large anechoic left-sided pleural effusion. The lower chest was prepped and draped in the usual sterile fashion. 1% lidocaine was used for local anesthesia. An ultrasound image was saved for documentation purposes. An 8 Fr Safe-T-Centesis catheter was introduced. The thoracentesis was performed. Despite residual fluid within the left pleural space, the patient wished to terminate the procedure given left-sided chest pain. As such, the catheter was removed and a dressing was applied. The patient tolerated the procedure well without immediate post procedural complication. The patient was escorted to have an upright chest radiograph. FINDINGS: A total of approximately 1.9 liters of amber colored serous fluid was removed. IMPRESSION: Successful ultrasound-guided left sided thoracentesis yielding 1.9 liters of pleural fluid. Electronically Signed   By: Sandi Mariscal M.D.   On: 03/06/2018 16:04     LOS: 7 days   Oren Binet, MD  [facility]  If 7PM-7AM, please contact night-coverage  Please page via [email or web address]  Go to [email or web address] and use [facility] universal password to access. If you do not have the password, please contact the hospital operator.  Locate the  [facility] provider you are looking for under [facility] and page to a number that you can be directly reached. If you still have difficulty reaching the provider, please page the [facility] (Director on Call) for the Hospitalists  listed on amion for assistance.  04/04/2018, 2:15 PM

## 2018-04-05 LAB — BASIC METABOLIC PANEL
Anion gap: 7 (ref 5–15)
BUN: 36 mg/dL — ABNORMAL HIGH (ref 8–23)
CO2: 23 mmol/L (ref 22–32)
Calcium: 9.4 mg/dL (ref 8.9–10.3)
Chloride: 106 mmol/L (ref 98–111)
Creatinine, Ser: 2.32 mg/dL — ABNORMAL HIGH (ref 0.44–1.00)
GFR calc Af Amer: 25 mL/min — ABNORMAL LOW (ref >60)
GFR, EST NON AFRICAN AMERICAN: 22 mL/min — AB (ref >60)
Glucose, Bld: 93 mg/dL (ref 70–99)
Potassium: 4.5 mmol/L (ref 3.5–5.1)
Sodium: 136 mmol/L (ref 135–145)

## 2018-04-05 NOTE — Progress Notes (Signed)
Patient expressed vomiting episode. IV zofran given.

## 2018-04-05 NOTE — Plan of Care (Signed)

## 2018-04-05 NOTE — Progress Notes (Signed)
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 Laurence Ferrari. Patient awake and alert laying in bed. Complains of tenderness near PleurX incision site. PleurX bandage saturated with blood tinged fluid, removed and site c/d/i.   Allergies: Biaxin [clarithromycin]; Penicillins; Zoloft [sertraline hcl]; Milk-related compounds; Lactose intolerance (gi); and Other  Medications: Prior to Admission medications   Medication Sig Start Date End Date Taking? Authorizing Provider  albuterol (PROVENTIL) (2.5 MG/3ML) 0.083% nebulizer solution Inhale 3 mLs (2.5 mg total) into the lungs every 6 (six) hours as needed for wheezing or shortness of breath. 03/27/18  Yes Epifanio Lesches, MD  busPIRone (BUSPAR) 10 MG tablet Take 10 mg by mouth 3 (three) times daily.   Yes [provider]  calcium carbonate (TUMS - DOSED IN MG ELEMENTAL CALCIUM) 500 MG chewable tablet Chew 2 tablets by mouth 2 (two) times daily.   Yes [provider]  citalopram (CELEXA) 20 MG tablet Take 20 mg by mouth daily.   Yes [provider]  docusate sodium (COLACE) 100 MG capsule Take 100 mg by mouth 2 (two) times daily.   Yes [provider]  famotidine (PEPCID) 40 MG tablet Take 40 mg by mouth at bedtime.   Yes [provider]  furosemide (LASIX) 20 MG tablet Take 1 tablet (20 mg total) by mouth daily. 03/27/18  Yes Epifanio Lesches, MD  gabapentin (NEURONTIN) 100 MG capsule Take 200 mg by mouth at bedtime.   Yes [provider]  primidone (MYSOLINE) 50 MG tablet Take 50 mg by mouth 2 (two) times daily.   Yes [provider]     Vital Signs: BP (!) 141/54    Pulse 71    Temp 97.7 F (36.5 C) (Oral)    Resp 14    Ht 5' 2"  (1.575 m)    Wt 147 lb 4.3 oz (66.8 kg)    SpO2 100%     BMI 26.94 kg/m   Physical Exam Vitals signs and nursing note reviewed.  Constitutional:      General: She is not in acute distress.    Appearance: Normal appearance.  Pulmonary:     Effort: Pulmonary effort is normal. No respiratory distress.     Comments: Left PleurX catheter site with mild tenderness, no erythema, drainage, or active bleeding; site was redressed with gauze and tegaderm. Skin:    General: Skin is warm and dry.  Neurological:     Mental Status: She is alert and oriented to person, place, and time.  Psychiatric:        Mood and Affect: Mood normal.        Behavior: Behavior normal.        Thought Content: Thought content normal.        Judgment: Judgment normal.     Imaging: Dg Chest 2 View  Result Date: 04/02/2018 CLINICAL DATA:  Shortness of breath EXAM: CHEST - 2 VIEW COMPARISON:  Three days ago FINDINGS: Large and significantly increased left pleural effusion. Borderline cardiopericardial enlargement, distorted by pleural fluid. Streaky density at the medial right base also seen previously. IMPRESSION: Large left pleural effusion with significant re-accumulation since 3 days ago. Electronically Signed   By: Monte Fantasia M.D.   On: 04/02/2018 09:39   Ir Guided Niel Hummer W Catheter Placement  Result Date: 04/04/2018 INDICATION: 63 year old female with cirrhosis complicated  by ascites and left hepatic hydrothorax. Her renal and hepatic function has been fluctuating in her meld has been elevated between 20 in 23. The risks, benefits and alternatives to creation of a an elective TIPS versus placement of a tunneled pleural drainage catheter were discussed with the patient. At this time, she prefers tunneled pleural drainage catheter and hospice rather than proceeding with a TIPS given the relatively high rate of mortality for meld above 19. She presents for tunneled pleural drainage catheter. EXAM: ABSCESS DRAINAGE MEDICATIONS: The patient is currently admitted to the  hospital and receiving intravenous antibiotics. The antibiotics were administered within an appropriate time frame prior to the initiation of the procedure. ANESTHESIA/SEDATION: Fentanyl 50 mcg IV; Versed 1 mg IV Moderate Sedation Time:  23 minutes The patient was continuously monitored during the procedure by the interventional radiology nurse under my direct supervision. COMPLICATIONS: None immediate. PROCEDURE: Informed written consent was obtained from the patient after a thorough discussion of the procedural risks, benefits and alternatives. All questions were addressed. Maximal Sterile Barrier Technique was utilized including caps, mask, sterile gowns, sterile gloves, sterile drape, hand hygiene and skin antiseptic. A timeout was performed prior to the initiation of the procedure. The left chest was interrogated with ultrasound. There is a large pleural effusion. A suitable skin entry site was selected and marked. Local anesthesia was attained by infiltration with 1% lidocaine. A small dermatotomy was made. Under real-time sonographic guidance, an 18 gauge needle was advanced into the pleural space. A wire was subsequently advanced into the pleural space. A suitable skin exit site was selected slightly inferior and medial to the pleural entry site. Local anesthesia was again attained by infiltrating 1% lidocaine and a second small dermatotomy was made. The PleurX tunneled drainage catheter was then tunneled from the skin exit site to the dermatotomy overlying the pleural access site. A peel-away sheath was then advanced over a wire and into the left pleural space. The catheter was advanced through the peel-away sheath and into the pleural space. The catheter was then connected to vacuum tender bottles and a total of 2.2 L was removed. The dermatotomy overlying the pleural entry site was closed with an interrupted inverted subdermal Vicryl suture and then sealed with Dermabond. The catheter was secured to the  skin with 0 silk suture. Sterile bandages were placed. IMPRESSION: Successful placement of a left tunneled pleural drainage catheter with aspiration of 2.2 L of pleural fluid. Electronically Signed   By: Jacqulynn Cadet M.D.   On: 04/04/2018 13:07   Ir US Guide Vasc Access Left  Result Date: 04/04/2018 INDICATION: 63 year old female with cirrhosis complicated by ascites and left hepatic hydrothorax. Her renal and hepatic function has been fluctuating in her meld has been elevated between 20 in 23. The risks, benefits and alternatives to creation of a an elective TIPS versus placement of a tunneled pleural drainage catheter were discussed with the patient. At this time, she prefers tunneled pleural drainage catheter and hospice rather than proceeding with a TIPS given the relatively high rate of mortality for meld above 19. She presents for tunneled pleural drainage catheter. EXAM: Tunneled pleural drain placement, Left MEDICATIONS: The patient is currently admitted to the hospital and receiving intravenous antibiotics. The antibiotics were administered within an appropriate time frame prior to the initiation of the procedure. ANESTHESIA/SEDATION: Fentanyl 50 mcg IV; Versed 1 mg IV Moderate Sedation Time: 23 minutes The patient was continuously monitored during the procedure by the interventional radiology nurse under my direct  supervision. COMPLICATIONS: None immediate. PROCEDURE: Informed written consent was obtained from the patient after a thorough discussion of the procedural risks, benefits and alternatives. All questions were addressed. Maximal Sterile Barrier Technique was utilized including caps, mask, sterile gowns, sterile gloves, sterile drape, hand hygiene and skin antiseptic. A timeout was performed prior to the initiation of the procedure. The left chest was interrogated with ultrasound. There is a large pleural effusion. A suitable skin entry site was selected and marked. Local anesthesia was  attained by infiltration with 1% lidocaine. A small dermatotomy was made. Under real-time sonographic guidance, an 18 gauge needle was advanced into the pleural space. A wire was subsequently advanced into the pleural space. A suitable skin exit site was selected slightly inferior and medial to the pleural entry site. Local anesthesia was again attained by infiltrating 1% lidocaine and a second small dermatotomy was made. The PleurX tunneled drainage catheter was then tunneled from the skin exit site to the dermatotomy overlying the pleural access site. A peel-away sheath was then advanced over a wire and into the left pleural space. The catheter was advanced through the peel-away sheath and into the pleural space. The catheter was then connected to vacuum tender bottles and a total of 2.2 L was removed. The dermatotomy overlying the pleural entry site was closed with an interrupted inverted subdermal Vicryl suture and then sealed with Dermabond. The catheter was secured to the skin with 0 silk suture. Sterile bandages were placed. IMPRESSION: Successful placement of a left tunneled pleural drainage catheter with aspiration of 2.2 L of pleural fluid. Electronically Signed   By: Jacqulynn Cadet M.D.   On: 04/04/2018 15:47   Dg Chest Port 1 View  Result Date: 04/04/2018 CLINICAL DATA:  63 year old female with hepatic hydrothorax. Recently underwent placement of a tunneled pleural drainage catheter. EXAM: PORTABLE CHEST 1 VIEW COMPARISON:  Preoperative chest x-ray 04/02/2018 FINDINGS: New tunneled left pleural drainage catheter is well positioned in the medial aspect of the left inferior pleural space. Significant reduction in the volume of pleural fluid compared to 04/02/2018. A moderate right pleural effusion exists. The cardiac and mediastinal contours are within normal limits. No evidence of pneumothorax. No acute osseous abnormality. IMPRESSION: New left PleurX catheter with significant reduced volume of the  left pleural effusion. Moderate right pleural effusion. Electronically Signed   By: Jacqulynn Cadet M.D.   On: 04/04/2018 16:31    Labs:  CBC: Recent Labs    03/30/18 0550 03/31/18 0208 04/01/18 0322 04/02/18 0330  WBC 2.4* 1.9* 1.9* 2.2*  HGB 7.4* 6.9* 7.9* 9.0*  HCT 22.3* 21.2* 24.1* 27.8*  PLT 54* 49* 48* 53*    COAGS: Recent Labs    03/14/18 1301 03/26/18 2000 04/02/18 0330  INR 1.33 1.4* 1.7*  APTT 32  --   --     BMP: Recent Labs    04/01/18 0322 04/02/18 0330 04/04/18 0408 04/05/18 0513  NA 138 141 141 136  K 4.6 4.5 4.3 4.5  CL 107 109 103 106  CO2 24 27 22 23   GLUCOSE 105* 88 82 93  BUN 33* 34* 37* 36*  CALCIUM 9.8 10.2 10.6* 9.4  CREATININE 1.96* 2.27* 2.37* 2.32*  GFRNONAA 27* 23* 21* 22*  GFRAA 31* 26* 25* 25*    LIVER FUNCTION TESTS: Recent Labs    03/28/18 2130 03/30/18 0550 04/01/18 0322 04/02/18 0330  BILITOT 1.2 1.3* 2.0* 1.8*  AST 43* 30 28 24   ALT 31 24 18 16   ALKPHOS 87  61 34* 41  PROT 5.9* 5.5* 6.3* 6.3*  ALBUMIN 2.3* 3.0* 5.0 4.8    Assessment and Plan:  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 Laurence Ferrari. Left PleurX catheter stable- bandage was saturated, removed and no signs of active bleeding, site re-dressed with RN at bedside, ready for use. Please call IR with questions/concerns.   Electronically Signed: Earley Abide, PA-C 04/05/2018, 12:14 PM   I spent a total of 35 Minutes at the the patient's bedside AND on the patient's hospital floor or unit, greater than 50% of which was counseling/coordinating care for left hydrothorax s/p left PleurX catheter placement.

## 2018-04-05 NOTE — Progress Notes (Addendum)
PROGRESS NOTE    Kelly Mclean  [id number]:[id number]  DOB: [birth year]  DOA: 03/28/2018 PCP: Marnee Guarneri, MD  Brief Narrative:  62 y.o. 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   Thought to have hepatic hydrothorax-IR consulted for TIPS procedure-but given high meld score-risks may be unacceptable.  IR subsequently placed a Pleurx catheter (for comfort) on 3/13. Poor prognosis-seen by palliative care-DNR in place-tentative plans were to discharge home with hospice care-however family not able to provide 24/7 care-social worker in the process of seeing if patient can qualify for SNF.   Subjective:  Patient resting comfortably.  Denies any acute complaints.  Nurse reports bleeding at Pleurx catheter dressing site.  Objective: Vitals:   04/04/18 2211 04/05/18 0436 04/05/18 0532 04/05/18 1341  BP: 138/62  (!) 141/54 (!) 129/59  Pulse: 74  71 77  Resp: 16  14 20   Temp: 98.4 F (36.9 C)  97.7 F (36.5 C) 98.5 F (36.9 C)  TempSrc: Oral  Oral Oral  SpO2: 100%  100% 100%  Weight:  66.8 kg    Height:        Intake/Output Summary (Last 24 hours) at 04/05/2018 1926 Last data filed at 04/05/2018 1230 Gross per 24 hour  Intake 360 ml  Output 425 ml  Net -65 ml   Filed Weights   04/03/18 0612 04/04/18 0251 04/05/18 0436  Weight: 68.3 kg 68.5 kg 66.8 kg    Physical Examination:  General exam: Appears calm and comfortable  Respiratory system: decreased breath sounds at bases.Otherwise clear to auscultation. Respiratory effort normal. Cardiovascular system: S1 & S2 heard, RRR. No JVD, murmurs, rubs, gallops or clicks. No pedal edema. Gastrointestinal system: Abdomen is nondistended, soft and nontender. No organomegaly or masses felt. Normal bowel sounds heard. Central nervous  system: Alert and oriented. No focal neurological deficits. Extremities: Symmetric 5 x 5 power. Skin: Local bleeding at Pleurx catheter site as shown below Psychiatry: Judgement and insight appear normal. Mood & affect appropriate.       Data Reviewed: I have personally reviewed following labs and imaging studies  CBC: Recent Labs  Lab 03/30/18 0550 03/31/18 0208 04/01/18 0322 04/02/18 0330  WBC 2.4* 1.9* 1.9* 2.2*  HGB 7.4* 6.9* 7.9* 9.0*  HCT 22.3* 21.2* 24.1* 27.8*  MCV 102.3* 101.9* 101.3* 101.1*  PLT 54* 49* 48* 53*   Basic Metabolic Panel: Recent Labs  Lab 03/31/18 0208 04/01/18 0322 04/02/18 0330 04/04/18 0408 04/05/18 0513  NA 134* 138 141 141 136  K 4.9 4.6 4.5 4.3 4.5  CL 107 107 109 103 106  CO2 22 24 27 22 23   GLUCOSE 132* 105* 88 82 93  BUN 38* 33* 34* 37* 36*  CREATININE 1.94* 1.96* 2.27* 2.37* 2.32*  CALCIUM 9.0 9.8 10.2 10.6* 9.4  PHOS  --   --  3.5  --   --    GFR: Estimated Creatinine Clearance: 22.8 mL/min (A) (by C-G formula based on SCr of 2.32 mg/dL (H)). Liver Function Tests: Recent Labs  Lab 03/30/18 0550 04/01/18 0322 04/02/18 0330  AST 30 28 24   ALT 24 18 16   ALKPHOS 61 34* 41  BILITOT 1.3* 2.0* 1.8*  PROT 5.5* 6.3* 6.3*  ALBUMIN 3.0* 5.0 4.8   No results for input(s): LIPASE, AMYLASE in the last 168 hours. No results for input(s): AMMONIA in the last  168 hours. Coagulation Profile: Recent Labs  Lab 04/02/18 0330  INR 1.7*   Cardiac Enzymes: No results for input(s): CKTOTAL, CKMB, CKMBINDEX, TROPONINI in the last 168 hours. BNP (last 3 results) No results for input(s): PROBNP in the last 8760 hours. HbA1C: No results for input(s): HGBA1C in the last 72 hours. CBG: No results for input(s): GLUCAP in the last 168 hours. Lipid Profile: No results for input(s): CHOL, HDL, LDLCALC, TRIG, CHOLHDL, LDLDIRECT in the last 72 hours. Thyroid Function Tests: No results for input(s): TSH, T4TOTAL, FREET4, T3FREE, THYROIDAB in  the last 72 hours. Anemia Panel: No results for input(s): VITAMINB12, FOLATE, FERRITIN, TIBC, IRON, RETICCTPCT in the last 72 hours. Sepsis Labs: No results for input(s): PROCALCITON, LATICACIDVEN in the last 168 hours.  No results found for this or any previous visit (from the past 240 hour(s)).    Radiology Studies: Ir Lenise Arena W Catheter Placement  Result Date: 04/04/2018 INDICATION: 63 year old female with cirrhosis complicated by ascites and left hepatic hydrothorax. Her renal and hepatic function has been fluctuating in her meld has been elevated between 20 in 23. The risks, benefits and alternatives to creation of a an elective TIPS versus placement of a tunneled pleural drainage catheter were discussed with the patient. At this time, she prefers tunneled pleural drainage catheter and hospice rather than proceeding with a TIPS given the relatively high rate of mortality for meld above 19. She presents for tunneled pleural drainage catheter. EXAM: ABSCESS DRAINAGE MEDICATIONS: The patient is currently admitted to the hospital and receiving intravenous antibiotics. The antibiotics were administered within an appropriate time frame prior to the initiation of the procedure. ANESTHESIA/SEDATION: Fentanyl 50 mcg IV; Versed 1 mg IV Moderate Sedation Time:  23 minutes The patient was continuously monitored during the procedure by the interventional radiology nurse under my direct supervision. COMPLICATIONS: None immediate. PROCEDURE: Informed written consent was obtained from the patient after a thorough discussion of the procedural risks, benefits and alternatives. All questions were addressed. Maximal Sterile Barrier Technique was utilized including caps, mask, sterile gowns, sterile gloves, sterile drape, hand hygiene and skin antiseptic. A timeout was performed prior to the initiation of the procedure. The left chest was interrogated with ultrasound. There is a large pleural effusion. A suitable  skin entry site was selected and marked. Local anesthesia was attained by infiltration with 1% lidocaine. A small dermatotomy was made. Under real-time sonographic guidance, an 18 gauge needle was advanced into the pleural space. A wire was subsequently advanced into the pleural space. A suitable skin exit site was selected slightly inferior and medial to the pleural entry site. Local anesthesia was again attained by infiltrating 1% lidocaine and a second small dermatotomy was made. The PleurX tunneled drainage catheter was then tunneled from the skin exit site to the dermatotomy overlying the pleural access site. A peel-away sheath was then advanced over a wire and into the left pleural space. The catheter was advanced through the peel-away sheath and into the pleural space. The catheter was then connected to vacuum tender bottles and a total of 2.2 L was removed. The dermatotomy overlying the pleural entry site was closed with an interrupted inverted subdermal Vicryl suture and then sealed with Dermabond. The catheter was secured to the skin with 0 silk suture. Sterile bandages were placed. IMPRESSION: Successful placement of a left tunneled pleural drainage catheter with aspiration of 2.2 L of pleural fluid. Electronically Signed   By: Jacqulynn Cadet M.D.   On: 04/04/2018 13:07  Ir US Guide Vasc Access Left  Result Date: 04/04/2018 INDICATION: 63 year old female with cirrhosis complicated by ascites and left hepatic hydrothorax. Her renal and hepatic function has been fluctuating in her meld has been elevated between 20 in 23. The risks, benefits and alternatives to creation of a an elective TIPS versus placement of a tunneled pleural drainage catheter were discussed with the patient. At this time, she prefers tunneled pleural drainage catheter and hospice rather than proceeding with a TIPS given the relatively high rate of mortality for meld above 19. She presents for tunneled pleural drainage catheter.  EXAM: Tunneled pleural drain placement, Left MEDICATIONS: The patient is currently admitted to the hospital and receiving intravenous antibiotics. The antibiotics were administered within an appropriate time frame prior to the initiation of the procedure. ANESTHESIA/SEDATION: Fentanyl 50 mcg IV; Versed 1 mg IV Moderate Sedation Time: 23 minutes The patient was continuously monitored during the procedure by the interventional radiology nurse under my direct supervision. COMPLICATIONS: None immediate. PROCEDURE: Informed written consent was obtained from the patient after a thorough discussion of the procedural risks, benefits and alternatives. All questions were addressed. Maximal Sterile Barrier Technique was utilized including caps, mask, sterile gowns, sterile gloves, sterile drape, hand hygiene and skin antiseptic. A timeout was performed prior to the initiation of the procedure. The left chest was interrogated with ultrasound. There is a large pleural effusion. A suitable skin entry site was selected and marked. Local anesthesia was attained by infiltration with 1% lidocaine. A small dermatotomy was made. Under real-time sonographic guidance, an 18 gauge needle was advanced into the pleural space. A wire was subsequently advanced into the pleural space. A suitable skin exit site was selected slightly inferior and medial to the pleural entry site. Local anesthesia was again attained by infiltrating 1% lidocaine and a second small dermatotomy was made. The PleurX tunneled drainage catheter was then tunneled from the skin exit site to the dermatotomy overlying the pleural access site. A peel-away sheath was then advanced over a wire and into the left pleural space. The catheter was advanced through the peel-away sheath and into the pleural space. The catheter was then connected to vacuum tender bottles and a total of 2.2 L was removed. The dermatotomy overlying the pleural entry site was closed with an interrupted  inverted subdermal Vicryl suture and then sealed with Dermabond. The catheter was secured to the skin with 0 silk suture. Sterile bandages were placed. IMPRESSION: Successful placement of a left tunneled pleural drainage catheter with aspiration of 2.2 L of pleural fluid. Electronically Signed   By: Jacqulynn Cadet M.D.   On: 04/04/2018 15:47   Dg Chest Port 1 View  Result Date: 04/04/2018 CLINICAL DATA:  63 year old female with hepatic hydrothorax. Recently underwent placement of a tunneled pleural drainage catheter. EXAM: PORTABLE CHEST 1 VIEW COMPARISON:  Preoperative chest x-ray 04/02/2018 FINDINGS: New tunneled left pleural drainage catheter is well positioned in the medial aspect of the left inferior pleural space. Significant reduction in the volume of pleural fluid compared to 04/02/2018. A moderate right pleural effusion exists. The cardiac and mediastinal contours are within normal limits. No evidence of pneumothorax. No acute osseous abnormality. IMPRESSION: New left PleurX catheter with significant reduced volume of the left pleural effusion. Moderate right pleural effusion. Electronically Signed   By: Jacqulynn Cadet M.D.   On: 04/04/2018 16:31        Scheduled Meds:  sodium chloride   Intravenous Once   busPIRone  10 mg Oral TID  calcium carbonate  2 tablet Oral BID   citalopram  20 mg Oral Daily   docusate  100 mg Oral BID   famotidine  10 mg Oral QHS   gabapentin  200 mg Oral QHS   primidone  50 mg Oral BID   Continuous Infusions:  Assessment & Plan:    1.  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  Required thoracentesis x6 last month and now status post Pleurx catheter on March 13.Status post removal of 2 L pleural fluid by IR.Pleural fluid cultures and cytology negative for malignancy.  Comfort goals at this point.  Requested IR to follow-up regarding local bleeding at  the dressing site.  Taper O2 to off as tolerated.  2.  Nonalcoholic liver cirrhosis: Decompensated/complicated by ascites, hepatic hydrothorax and possibly hepatorenal syndrome.  Not on diuretics and concern for AKI.  No acute distress and saturating well on 3 L nasal cannula.    3.  AKI on chronic kidney disease stage III: Hepatorenal syndrome suspected and patient seen by nephrology.  Initially on octreotide, midodrine and IV albumin.  Currently off in view of comfort goals.  4.  Anemia/thrombocytopenia: In the setting of liver cirrhosis/splenomegaly and chronic kidney disease.  Status post 1 unit PRBC in this admission and hemoglobin stable around 9.  Mild local bleeding as shown above, dressing changed today.  Monitor hemoglobin.  5.  Depression/anxiety: On BuSpar and Celexa.  DVT prophylaxis: SCDs given bleeding risk/thrombocytopenia Code Status: DNR/comfort goals Family / Patient Communication: Discussed with patient.  Palliative care team following Disposition Plan: Skilled nursing facility with palliative care.  Does not qualify for inpatient hospice.     LOS: 8 days    Time spent: [place]    Guilford Shi, MD [facility] Pager [phone]  If 7PM-7AM, please contact night-coverage [email or web address] Password [facility] 04/05/2018, 7:26 PM

## 2018-04-06 ENCOUNTER — Inpatient Hospital Stay (HOSPITAL_COMMUNITY): Payer: Medicare (Managed Care)

## 2018-04-06 NOTE — Progress Notes (Signed)
PROGRESS NOTE    Kelly Mclean  [id number]  DOB: 1955-02-17  DOA: 03/28/2018 PCP: Marnee Guarneri, MD  Brief Narrative:  63 y.o. 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   Thought to have hepatic hydrothorax-IR consulted for TIPS procedure-but given high meld score-risks may be unacceptable.  IR subsequently placed a Pleurx catheter (for comfort) on 3/13. Poor prognosis-seen by palliative care-DNR in place-tentative plans were to discharge home with hospice care-however family not able to provide 24/7 care-social worker in the process of seeing if patient can qualify for SNF.   Subjective:  Patient resting comfortably.  Denies any acute complaints.  Unable to wean down O2.   Objective: Vitals:   04/05/18 1341 04/05/18 2326 04/06/18 0247 04/06/18 1436  BP: (!) 129/59 134/60  (!) 128/58  Pulse: 77 72  79  Resp: 20 20  18   Temp: 98.5 F (36.9 C) 98.2 F (36.8 C)  98.2 F (36.8 C)  TempSrc: Oral Oral  Oral  SpO2: 100% 100%  100%  Weight:   65.6 kg   Height:        Intake/Output Summary (Last 24 hours) at 04/06/2018 1657 Last data filed at 04/05/2018 2116 Gross per 24 hour  Intake --  Output 1 ml  Net -1 ml   Filed Weights   04/04/18 0251 04/05/18 0436 04/06/18 0247  Weight: 68.5 kg 66.8 kg 65.6 kg    Physical Examination:  General exam: Appears calm and comfortable  Respiratory system: decreased breath sounds at bases.Otherwise clear to auscultation. Respiratory effort normal. Cardiovascular system: S1 & S2 heard, RRR. No JVD, murmurs, rubs, gallops or clicks. No pedal edema. Gastrointestinal system: Abdomen is nondistended, soft and nontender. No organomegaly or masses felt. Normal bowel sounds heard. Central nervous system: Alert and oriented. No focal  neurological deficits. Extremities: Symmetric 5 x 5 power. Skin: Left-sided Pleurx catheter site clean with new dressing in place Psychiatry: Judgement and insight appear normal. Mood & affect appropriate.       Data Reviewed: I have personally reviewed following labs and imaging studies  CBC: Recent Labs  Lab 03/31/18 0208 04/01/18 0322 04/02/18 0330  WBC 1.9* 1.9* 2.2*  HGB 6.9* 7.9* 9.0*  HCT 21.2* 24.1* 27.8*  MCV 101.9* 101.3* 101.1*  PLT 49* 48* 53*   Basic Metabolic Panel: Recent Labs  Lab 03/31/18 0208 04/01/18 0322 04/02/18 0330 04/04/18 0408 04/05/18 0513  NA 134* 138 141 141 136  K 4.9 4.6 4.5 4.3 4.5  CL 107 107 109 103 106  CO2 22 24 27 22 23   GLUCOSE 132* 105* 88 82 93  BUN 38* 33* 34* 37* 36*  CREATININE 1.94* 1.96* 2.27* 2.37* 2.32*  CALCIUM 9.0 9.8 10.2 10.6* 9.4  PHOS  --   --  3.5  --   --    GFR: Estimated Creatinine Clearance: 22.6 mL/min (A) (by C-G formula based on SCr of 2.32 mg/dL (H)). Liver Function Tests: Recent Labs  Lab 04/01/18 0322 04/02/18 0330  AST 28 24  ALT 18 16  ALKPHOS 34* 41  BILITOT 2.0* 1.8*  PROT 6.3* 6.3*  ALBUMIN 5.0 4.8   No results for input(s): LIPASE, AMYLASE in the last 168 hours. No results for input(s): AMMONIA in the last 168 hours. Coagulation Profile: Recent Labs  Lab 04/02/18 0330  INR 1.7*   Cardiac Enzymes:  No results for input(s): CKTOTAL, CKMB, CKMBINDEX, TROPONINI in the last 168 hours. BNP (last 3 results) No results for input(s): PROBNP in the last 8760 hours. HbA1C: No results for input(s): HGBA1C in the last 72 hours. CBG: No results for input(s): GLUCAP in the last 168 hours. Lipid Profile: No results for input(s): CHOL, HDL, LDLCALC, TRIG, CHOLHDL, LDLDIRECT in the last 72 hours. Thyroid Function Tests: No results for input(s): TSH, T4TOTAL, FREET4, T3FREE, THYROIDAB in the last 72 hours. Anemia Panel: No results for input(s): VITAMINB12, FOLATE, FERRITIN, TIBC, IRON,  RETICCTPCT in the last 72 hours. Sepsis Labs: No results for input(s): PROCALCITON, LATICACIDVEN in the last 168 hours.  No results found for this or any previous visit (from the past 240 hour(s)).    Radiology Studies: Dg Chest Port 1 View  Result Date: 04/06/2018 CLINICAL DATA:  Follow-up pleural effusion. EXAM: PORTABLE CHEST 1 VIEW COMPARISON:  04/04/2018 FINDINGS: Left-sided chest tube remains in place but is migrated within the pleural space. There are persistent bilateral effusions with dependent atelectasis. The effusion may be enlarging on the left. IMPRESSION: Persistent effusions with lower lung atelectasis. The effusion may be enlarging on the left, despite the presence of a pleural catheter. Electronically Signed   By: Nelson Chimes M.D.   On: 04/06/2018 12:04        Scheduled Meds:  sodium chloride   Intravenous Once   busPIRone  10 mg Oral TID   calcium carbonate  2 tablet Oral BID   citalopram  20 mg Oral Daily   docusate  100 mg Oral BID   famotidine  10 mg Oral QHS   gabapentin  200 mg Oral QHS   primidone  50 mg Oral BID   Continuous Infusions:  Assessment & Plan:    1.  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  Required thoracentesis x6 last month and now status post Pleurx catheter and status post removal of 2 L pleural fluid by IR on 3/13.Pleural fluid cultures and cytology negative for malignancy.  Comfort goals at this point.   Repeat chest x-ray today shows increasing left pleural effusion and persistent moderate right pleural effusion.  Curb sided with pulmonary who recommended 3 times a week Pleurx catheter draining and to periodically monitor right-sided effusion.  Will request nursing staff to drain pleural fluid via Pleurx catheter Monday Wednesday Friday.Taper O2 to off as tolerated.  Not on diuretics and concern for problem #3  2.  Nonalcoholic liver  cirrhosis: Decompensated/complicated by ascites, hepatic hydrothorax and possibly hepatorenal syndrome.  Not on diuretics and concern for AKI.  No acute distress and saturating well on 3-4 L nasal cannula.    3.  AKI on chronic kidney disease stage III: Hepatorenal syndrome suspected and patient seen by nephrology.  Initially on octreotide, midodrine and IV albumin.  Currently off in view of comfort goals.  4.  Pancytopenia: In the setting of liver cirrhosis/splenomegaly and chronic kidney disease.  Status post 1 unit PRBC in this admission and hemoglobin been stable around 9.  Mild local bleeding yesterday at Pleurx catheter site, dressing now changed.  Labs in a.m.  5.  Depression/anxiety: On BuSpar and Celexa.  DVT prophylaxis: SCDs given bleeding risk/thrombocytopenia Code Status: DNR/comfort goals Family / Patient Communication: Discussed with patient.  Palliative care team following Disposition Plan: Skilled nursing facility with palliative care.  Does not qualify for inpatient hospice.  LOS: 9 days    Time spent: 25 MIN    Guilford Shi, MD [facility] Pager [phone]  If 7PM-7AM, please contact night-coverage [email or web address] Password [facility] 04/06/2018, 4:57 PM

## 2018-04-07 LAB — CBC
HCT: 28.3 % — ABNORMAL LOW (ref 36.0–46.0)
Hemoglobin: 9.6 g/dL — ABNORMAL LOW (ref 12.0–15.0)
MCH: 33.4 pg (ref 26.0–34.0)
MCHC: 33.9 g/dL (ref 30.0–36.0)
MCV: 98.6 fL (ref 80.0–100.0)
PLATELETS: 55 10*3/uL — AB (ref 150–400)
RBC: 2.87 MIL/uL — ABNORMAL LOW (ref 3.87–5.11)
RDW: 14.2 % (ref 11.5–15.5)
WBC: 3.1 10*3/uL — ABNORMAL LOW (ref 4.0–10.5)
nRBC: 0 % (ref 0.0–0.2)

## 2018-04-07 LAB — ALBUMIN: ALBUMIN: 4.3 g/dL (ref 3.5–5.0)

## 2018-04-07 MED ORDER — DEXAMETHASONE 0.5 MG PO TABS
0.5000 mg | ORAL_TABLET | Freq: Two times a day (BID) | ORAL | Status: DC
  Administered 2018-04-07 – 2018-04-09 (×4): 0.5 mg via ORAL
  Filled 2018-04-07 (×5): qty 1

## 2018-04-07 MED ORDER — TRAMADOL HCL 50 MG PO TABS
50.0000 mg | ORAL_TABLET | Freq: Once | ORAL | Status: AC
  Administered 2018-04-07: 50 mg via ORAL

## 2018-04-07 MED ORDER — WITCH HAZEL-GLYCERIN EX PADS
MEDICATED_PAD | CUTANEOUS | Status: DC | PRN
  Filled 2018-04-07: qty 100

## 2018-04-07 NOTE — Progress Notes (Signed)
2pm-CSW left Trudy a voicemail letting her know that [facility] unable to accept patient. Patient requesting to discharge home with extra home support. CSW awaiting a call back.   11am-Per [facility], Irene Pap note able to accept patient. She requested CSW check with [facility]. Otherwise, patient may need to discharge home.   Percell Locus Dahir Ayer LCSW [phone]

## 2018-04-07 NOTE — Progress Notes (Signed)
Daily Progress Note   Patient Name: Kelly Mclean       Date: 04/07/2018 DOB: 1955/02/07  Age: 63 y.o. MRN#: [id number] Attending Physician: Damita Lack, MD Primary Care Physician: Marnee Guarneri, MD Admit Date: 03/28/2018  Reason for Consultation/Follow-up: Establishing goals of care  Subjective: Patient is sitting on the side of the bed. Has just finished using the bathroom and able to get herself back in bed. Noted she has eaten 50-75 percent of meals. She continues to make urine. Creatinine is stable. She tells me she is in discussion with social work and [facility] MD about her discharge. She is not interested in aggressive rehab. Per SW one nursing facility has declined her due to her drain. Awaiting to hear from another.  We discussed possibly returning home with increased services from [facility]. She stated that if her husband was ok with this, she would like this. Especially since SNF's are not allowing visitors at this time due to the coronavirus risks.   ROS  Length of Stay: 10  Current Medications: Scheduled Meds:  . sodium chloride   Intravenous Once  . busPIRone  10 mg Oral TID  . calcium carbonate  2 tablet Oral BID  . citalopram  20 mg Oral Daily  . dexamethasone  0.5 mg Oral BID  . docusate  100 mg Oral BID  . famotidine  10 mg Oral QHS  . gabapentin  200 mg Oral QHS  . primidone  50 mg Oral BID    Continuous Infusions:   PRN Meds: albuterol, ondansetron **OR** ondansetron (ZOFRAN) IV, traMADol  Physical Exam Vitals signs and nursing note reviewed.  Constitutional:      General: She is not in acute distress.    Appearance: She is not ill-appearing or toxic-appearing.  Cardiovascular:     Rate and Rhythm: Normal rate.     Heart sounds: Normal heart sounds.   Pulmonary:     Effort: Pulmonary effort is normal.  Abdominal:     General: Bowel sounds are normal.  Skin:    General: Skin is warm and dry.     Coloration: Skin is not jaundiced.  Neurological:     Mental Status: She is alert and oriented to person, place, and time.  Palliative Assessment/Data: PPS: 50%     Palliative Care Assessment & Plan   Patient Profile:  63 y.o. female  with past medical history of HTN, cirrhosis of the liver (w/ recurrent pleural effusions), cervical dystonia, PTSD, anemia admitted on 03/28/2018 from [facility] for TIPS procedure due hepatic hydrothorax. Procedure has been deferred awaiting MELD score to be lower than 20. She had received diuresis earlier and developed acute on chronic renal failure, suspicious for hepatorenal syndrome, however, is responding now to albumin and IV fluid resuscitation and Cr is stable.  Assessment/Recommendations/Plan   I cannot recommend residential hospice at this point  She is interested in SNF with Hospice or perhaps returning home with increased hours from [facility]- per SW she was offered 4 hours in the morning and some additional hours in the afternoon each day- Katlen said she believed she would do well with this support- she was under the impression she could only go home with one hour of support a day  I attempted to reach her husband but was unable  Will start dexamethasone .64m bid (recommend one dose with breakfast and one with lunch) to increase appetite, increase mood, and increase energy palliatively  Patient complains of a bleeding "skin tag" with bowel movement- more likely a hemorrhoid- will order tucks pads to use prn   Code Status:  DNR  Prognosis:   < 6 months  Discharge Planning:  To Be Determined  Care plan was discussed with patient and NKendallyou for allowing the Palliative Medicine Team to assist in the care of this patient.   Time In: 1000 Time Out: 1045 Total Time  45 mins Prolonged Time Billed no      Greater than 50%  of this time was spent counseling and coordinating care related to the above assessment and plan.  KMariana Kaufman AGNP-C Palliative Medicine   Please contact Palliative Medicine Team phone at [phone] questions and concerns.

## 2018-04-07 NOTE — Progress Notes (Signed)
PROGRESS NOTE    Kelly Mclean  RNH:[id number] DOB: 1955/08/02 DOA: 03/28/2018 PCP: Marnee Guarneri, MD   Brief Narrative:  63 year old with history of [place] cirrhosis, recurrent hepatic hydrothorax was sent to this hospital from [facility] for consideration of possible TIPS procedure.  Patient is required multiple thoracentesis.  Upon transfer here it was determined patient is not a good candidate for TIPS and Pleurx catheter was placed on 3/13.   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  Pleurx catheter placed, plan is to drain this Monday, Wednesday and Friday.  Wean down oxygen as necessary.  Not on diuretics due to kidney issues.  There is concerns for hepatorenal syndrome.  Advance goals of care - Given her end-stage liver cirrhosis which is fairly decompensated- opted for hospice.  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  Seen by nephrology.  No longer wants any further treatment as patient wants to be comfortable.  Appreciate nephrology input.  Pancytopenia -Secondary to underlying cirrhosis and chronic kidney disease.  Closely monitor this.  History of depression/anxiety - Continue BuSpar and Celexa   DVT prophylaxis: SCDs Code Status: DNR Family Communication:  None at bedside Disposition Plan: Transition patient to a nursing facility.  Does not meet full criteria for residential hospice  Consultants:   Nephrology  Radiology  Procedures:   Pleurx catheter placement 3/13  Antimicrobials:   None   Subjective: Patient is any complaints this morning.  No acute events overnight.  Review of Systems Otherwise negative except as per HPI, including: General: Denies fever, chills, night sweats or unintended weight loss. Resp: Denies cough, wheezing, shortness of breath. Cardiac: Denies chest pain, palpitations, orthopnea, paroxysmal nocturnal dyspnea. GI: Denies abdominal pain, nausea, vomiting, diarrhea or constipation GU: Denies dysuria, frequency, hesitancy or incontinence MS: Denies muscle aches, joint pain or swelling Neuro: Denies headache, neurologic deficits (focal weakness, numbness, tingling), abnormal gait Psych: Denies anxiety, depression, SI/HI/AVH Skin: Denies new rashes or lesions ID: Denies sick contacts, exotic exposures, travel  Objective: Vitals:   04/06/18 0247 04/06/18 1436 04/06/18 2359 04/07/18 0601  BP:  (!) 128/58 (!) 128/57 (!) 127/50  Pulse:  79 72 76  Resp:  18 18 16   Temp:  98.2 F (36.8 C) 98.7 F (37.1 C) 98.4 F (36.9 C)  TempSrc:  Oral Oral   SpO2:  100% 100% 100%  Weight: 65.6 kg   66.1 kg  Height:        Intake/Output Summary (Last 24 hours) at 04/07/2018 1231 Last data filed at 04/07/2018 1018 Gross per 24 hour  Intake -  Output 650 ml  Net -650 ml   Filed Weights   04/05/18 0436 04/06/18 0247 04/07/18 0601  Weight: 66.8 kg 65.6 kg 66.1 kg    Examination:  General exam: Appears calm and comfortable  Respiratory system: Clear to auscultation. Respiratory effort  normal. Left chest wall pleurx catheter.  Cardiovascular system: S1 & S2 heard, RRR. No JVD, murmurs, rubs, gallops or clicks. No pedal edema. Gastrointestinal system: Abdomen is nondistended, soft and nontender. No  organomegaly or masses felt. Normal bowel sounds heard. Central nervous system: Alert and oriented. No focal neurological deficits. Extremities: Symmetric 4 x 5 power. Skin: No rashes, lesions or ulcers Psychiatry: Judgement and insight appear normal. Mood & affect appropriate.     Data Reviewed:   CBC: Recent Labs  Lab 04/01/18 0322 04/02/18 0330 04/07/18 0550  WBC 1.9* 2.2* 3.1*  HGB 7.9* 9.0* 9.6*  HCT 24.1* 27.8* 28.3*  MCV 101.3* 101.1* 98.6  PLT 48* 53* 55*   Basic Metabolic Panel: Recent Labs  Lab 04/01/18 0322 04/02/18 0330 04/04/18 0408 04/05/18 0513  NA 138 141 141 136  K 4.6 4.5 4.3 4.5  CL 107 109 103 106  CO2 24 27 22 23   GLUCOSE 105* 88 82 93  BUN 33* 34* 37* 36*  CREATININE 1.96* 2.27* 2.37* 2.32*  CALCIUM 9.8 10.2 10.6* 9.4  PHOS  --  3.5  --   --    GFR: Estimated Creatinine Clearance: 22.7 mL/min (A) (by C-G formula based on SCr of 2.32 mg/dL (H)). Liver Function Tests: Recent Labs  Lab 04/01/18 0322 04/02/18 0330  AST 28 24  ALT 18 16  ALKPHOS 34* 41  BILITOT 2.0* 1.8*  PROT 6.3* 6.3*  ALBUMIN 5.0 4.8   No results for input(s): LIPASE, AMYLASE in the last 168 hours. No results for input(s): AMMONIA in the last 168 hours. Coagulation Profile: Recent Labs  Lab 04/02/18 0330  INR 1.7*   Cardiac Enzymes: No results for input(s): CKTOTAL, CKMB, CKMBINDEX, TROPONINI in the last 168 hours. BNP (last 3 results) No results for input(s): PROBNP in the last 8760 hours. HbA1C: No results for input(s): HGBA1C in the last 72 hours. CBG: No results for input(s): GLUCAP in the last 168 hours. Lipid Profile: No results for input(s): CHOL, HDL, LDLCALC, TRIG, CHOLHDL, LDLDIRECT in the last 72 hours. Thyroid Function Tests: No results for input(s): TSH, T4TOTAL, FREET4, T3FREE, THYROIDAB in the last 72 hours. Anemia Panel: No results for input(s): VITAMINB12, FOLATE, FERRITIN, TIBC, IRON, RETICCTPCT in the last 72 hours. Sepsis Labs: No  results for input(s): PROCALCITON, LATICACIDVEN in the last 168 hours.  No results found for this or any previous visit (from the past 240 hour(s)).       Radiology Studies: Dg Chest Port 1 View  Result Date: 04/06/2018 CLINICAL DATA:  Follow-up pleural effusion. EXAM: PORTABLE CHEST 1 VIEW COMPARISON:  04/04/2018 FINDINGS: Left-sided chest tube remains in place but is migrated within the pleural space. There are persistent bilateral effusions with dependent atelectasis. The effusion may be enlarging on the left. IMPRESSION: Persistent effusions with lower lung atelectasis. The effusion may be enlarging on the left, despite the presence of a pleural catheter. Electronically Signed   By: Nelson Chimes M.D.   On: 04/06/2018 12:04        Scheduled Meds: . sodium chloride   Intravenous Once  . busPIRone  10 mg Oral TID  . calcium carbonate  2 tablet Oral BID  . citalopram  20 mg Oral Daily  . dexamethasone  0.5 mg Oral BID  . docusate  100 mg Oral BID  . famotidine  10 mg Oral QHS  . gabapentin  200 mg Oral QHS  . primidone  50 mg Oral BID   Continuous Infusions:  LOS: 10 days   Time spent= 25 mins    Paiten Boies Arsenio Loader, MD [facility]  If 7PM-7AM, please contact night-coverage [email or web address] 04/07/2018, 12:31 PM

## 2018-04-07 NOTE — Progress Notes (Addendum)
Pt c/o pain at pleurx insertion site 5/10, Tramadol 63m every 12 hr, not due until 0400. Schorr, NP paged. Per Schorr, NP it is ok to give additional dose of Tramadol now and repeat at 0400 as needed.

## 2018-04-08 MED ORDER — TRAMADOL HCL 50 MG PO TABS: 50.0000 mg | ORAL_TABLET | Freq: Two times a day (BID) | ORAL | 0 refills | Status: AC | PRN

## 2018-04-08 MED ORDER — DEXAMETHASONE 0.5 MG PO TABS: 0.5000 mg | ORAL_TABLET | Freq: Two times a day (BID) | ORAL | 0 refills | Status: DC

## 2018-04-08 NOTE — Progress Notes (Signed)
[facility] trying to arrange home services but having difficulty due to their facility closing surrounding COVID-19.   Percell Locus Lynton Crescenzo LCSW [phone]

## 2018-04-08 NOTE — Progress Notes (Signed)
Physical Therapy Treatment Patient Details Name: Kelly Mclean MRN: [id number] DOB: 1955-10-20 Today's Date: 04/08/2018    History of Present Illness Patient is a 63 y/o female from [facility] due to recurrent hepatic hydrothorax. Past medical history significant of HTN, liver cirrhosis. Admitted for hepatic hydrothorax with possible TIPS procedure.     PT Comments    Pt performed gait training and functional mobility with slow progression.  She reports mild pain in L flank with movement.  Pt requires min guard assistance and plan for return home to be arranged by [facility].    Follow Up Recommendations  No PT follow up(plans for residential hospice)     Equipment Recommendations  None recommended by PT    Recommendations for Other Services       Precautions / Restrictions Precautions Precautions: Fall Restrictions Weight Bearing Restrictions: No    Mobility  Bed Mobility Overal bed mobility: Needs Assistance Bed Mobility: Supine to Sit;Sit to Supine     Supine to sit: Min assist Sit to supine: Supervision   General bed mobility comments: Pt required assistance to advance LE and hips to edge of bed, assistance to elevate trunk into sitting.  Pt able to return back to bed without assistance.    Transfers Overall transfer level: Needs assistance Equipment used: Rolling walker (2 wheeled) Transfers: Sit to/from Stand Sit to Stand: Min guard         General transfer comment: Cues for safety but no overt LOB, mild unsteadiness noted with increased time to complete.    Ambulation/Gait Ambulation/Gait assistance: Min assist Gait Distance (Feet): 120 Feet Assistive device: Rolling walker (2 wheeled) Gait Pattern/deviations: Step-through pattern;Trunk flexed;Decreased stride length;Drifts right/left Gait velocity: decreased   General Gait Details: Pt on 2L [place], RW drifting to R but pt is aware and able to self correct.  Pt required cues for upper trunk control and to increase B  stride length.   Stairs             Wheelchair Mobility    Modified Rankin (Stroke Patients Only)       Balance Overall balance assessment: Mild deficits observed, not formally tested Sitting-balance support: Feet supported Sitting balance-Leahy Scale: Fair       Standing balance-Leahy Scale: Poor                              Cognition Arousal/Alertness: Awake/alert Behavior During Therapy: WFL for tasks assessed/performed Overall Cognitive Status: Within Functional Limits for tasks assessed                                 General Comments: Reports fatigue and easily becomes SOB      Exercises      General Comments        Pertinent Vitals/Pain Pain Assessment: Faces Faces Pain Scale: Hurts little more Pain Location: L flank Pain Descriptors / Indicators: Sharp Pain Intervention(s): Monitored during session;Repositioned    Home Living                      Prior Function            PT Goals (current goals can now be found in the care plan section) Acute Rehab PT Goals Patient Stated Goal: regain strength Potential to Achieve Goals: Good Progress towards PT goals: Progressing toward goals    Frequency  Min 2X/week      PT Plan Current plan remains appropriate    Co-evaluation              AM-PAC PT "6 Clicks" Mobility   Outcome Measure  Help needed turning from your back to your side while in a flat bed without using bedrails?: A Little Help needed moving from lying on your back to sitting on the side of a flat bed without using bedrails?: A Little Help needed moving to and from a bed to a chair (including a wheelchair)?: A Little Help needed standing up from a chair using your arms (e.g., wheelchair or bedside chair)?: A Little Help needed to walk in hospital room?: A Little Help needed climbing 3-5 steps with a railing? : A Lot 6 Click Score: 17    End of Session Equipment Utilized During  Treatment: Gait belt;Oxygen Activity Tolerance: Patient limited by fatigue Patient left: in bed;with call bell/phone within reach Nurse Communication: Mobility status PT Visit Diagnosis: Other abnormalities of gait and mobility (R26.89);Muscle weakness (generalized) (M62.81)     Time: 1000-1020 PT Time Calculation (min) (ACUTE ONLY): 20 min  Charges:  $Gait Training: 8-22 mins                     Governor Rooks, PTA Acute Rehabilitation Services Pager [phone] Office [phone]     Kelly Mclean 04/08/2018, 10:35 AM

## 2018-04-08 NOTE — Discharge Summary (Signed)
Physician Discharge Summary  Kelly Mclean PRF:[id number] DOB: August 14, 1955 DOA: 03/28/2018  PCP: Marnee Guarneri, MD  Admit date: 03/28/2018 Discharge date: 04/08/2018  Admitted From: Home Disposition: Home with home health  Recommendations for Outpatient Follow-up:  1. Follow up with PCP in 1-2 weeks 2. Please obtain BMP/CBC in one week your next doctors visit.  3. Need Pleurx catheter drainage 3 times weekly-Monday, Wednesday and Friday.  Would avoid draining more than 800 cc at a time.  If she requires more frequent drainage, contact outpatient primary care provider so further adjustment can be made 4. Decadron and tramadol prescribed  Discharge Condition: Stable CODE STATUS: DNR Diet recommendation: 2 g salt diet  Brief/Interim Summary: 63 year old with history of [place] cirrhosis, recurrent hepatic hydrothorax was sent to this hospital from [facility] for consideration of possible TIPS procedure.  Patient is required multiple thoracentesis.  Upon transfer here it was determined patient is not a good candidate for TIPS and Pleurx catheter was placed on 3/13.  Patient was also seen by palliative care service during the hospitalization it was determined that she could be transition home with home hospice.  It was advised the patient get her Pleurx catheter drained Monday, Wednesday and Friday.  Advised to drain no more than 800 cc at a time, if she requires more drainage per session they will need to inform primary care provider so further adjustments can be made. At this time she is reached max of benefit from hospital stay and stable for discharge.  Per palliative care medicine recommendation, patient will be prescribed Decadron and tramadol.   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  Pleurx catheter placed 3/13, plan is to drain this Monday, Wednesday and Friday.  Wean down oxygen as necessary.  Not on diuretics due to kidney issues.  There is concerns for hepatorenal syndrome.  Would recommend draining Pleurx catheter 3 times a week but no more than 800 cc per session.  If requires more drainage should contact primary care provider.  Advance goals of care - Given her end-stage liver cirrhosis which is fairly decompensated- opted for hospice.  Patient does not qualify for residential hospice but family states they are not able to take care for her at home therefore social worker looking into alternative resources but due to coronavirus outbreak and limited facility availability- patient has decided to go back home with home health.  Seen by palliative care service, recommended tramadol and Decadron which has been prescribed  Chronic kidney disease stage III -Suspect hepatorenal syndrome.  Seen by nephrology.  No longer wants any further treatment as patient wants to be comfortable.  Appreciate nephrology input.  Pancytopenia -Secondary to underlying cirrhosis and chronic kidney disease.  Closely monitor this.  History of depression/anxiety - Continue BuSpar and Celexa   DVT prophylaxis: SCDs Code Status: DNR Family Communication: None at bedside Disposition Plan:  Discharge home with home health  Consultations:  Palliative care  Subjective: No complaints besides Pleurx catheter site pain.  Tolerated drainage from her Pleurx catheter  yesterday.  Discharge Exam: Vitals:   04/07/18 2125 04/08/18 0604  BP: (!) 141/50 (!) 138/52  Pulse: 74 75   Resp: 15 16  Temp: 98.5 F (36.9 C) 97.9 F (36.6 C)  SpO2: 100% 100%   Vitals:   04/07/18 1451 04/07/18 2125 04/08/18 0437 04/08/18 0604  BP: (!) 126/56 (!) 141/50  (!) 138/52  Pulse: 77 74  75  Resp: 18 15  16   Temp: 98.4 F (36.9 C) 98.5 F (36.9 C)  97.9 F (36.6 C)  TempSrc: Oral Oral  Oral  SpO2: 100% 100%  100%  Weight:   65.1 kg   Height:        General: Pt is alert, awake, not in acute distress Cardiovascular: RRR, S1/S2 +, no rubs, no gallops Respiratory: CTA bilaterally, no wheezing, no rhonchi Abdominal: Soft, NT, ND, bowel sounds + Extremities: no edema, no cyanosis Left chest wall Pleurx catheter noted.  Discharge Instructions   Allergies as of 04/08/2018      Reactions   Biaxin [clarithromycin] Shortness Of Breath, Rash   Penicillins Anaphylaxis, Other (See Comments)   Did it involve swelling of the face/tongue/throat, SOB, or low BP? Unknown Did it involve sudden or severe rash/hives, skin peeling, or any reaction on the inside of your mouth or nose? Unknown Did you need to seek medical attention at a hospital or doctor's office? Yes When did it last happen?childhood If all above answers are "NO", may proceed with cephalosporin use.   Zoloft [sertraline Hcl] Other (See Comments)   Reaction: "made crazy"   Milk-related Compounds Diarrhea   Lactose Intolerance (gi)    Other Other (See Comments)   Laughing gas pt turned gray,general anesthesia      Medication List    TAKE these medications   albuterol (2.5 MG/3ML) 0.083% nebulizer solution Commonly known as:  PROVENTIL Inhale 3 mLs (2.5 mg total) into the lungs every 6 (six) hours as needed for wheezing or shortness of breath.   busPIRone 10 MG tablet Commonly known as:  BUSPAR Take 10 mg by mouth 3 (three) times daily.   calcium carbonate 500 MG chewable tablet Commonly known as:  TUMS - dosed in mg elemental calcium Chew 2 tablets by mouth 2 (two) times daily.   citalopram 20 MG  tablet Commonly known as:  CELEXA Take 20 mg by mouth daily.   dexamethasone 0.5 MG tablet Commonly known as:  DECADRON Take 1 tablet (0.5 mg total) by mouth 2 (two) times daily for 30 days.   docusate sodium 100 MG capsule Commonly known as:  COLACE Take 100 mg by mouth 2 (two) times daily.   famotidine 40 MG tablet Commonly known as:  PEPCID Take 40 mg by mouth at bedtime.   furosemide 20 MG tablet Commonly known as:  LASIX Take 1 tablet (20 mg total) by mouth daily.   gabapentin 100 MG capsule Commonly known as:  NEURONTIN Take 200 mg by mouth at bedtime.   primidone 50 MG tablet Commonly known as:  MYSOLINE Take 50 mg by mouth 2 (two) times daily.   traMADol 50 MG tablet Commonly known as:  ULTRAM Take 1 tablet (50 mg total) by mouth every 12 (twelve) hours as needed for up to 5 days for moderate pain.       Allergies  Allergen Reactions  . Biaxin [Clarithromycin] Shortness Of Breath and Rash  . Penicillins Anaphylaxis and Other (See Comments)    Did it involve swelling of the face/tongue/throat, SOB, or  low BP? Unknown Did it involve sudden or severe rash/hives, skin peeling, or any reaction on the inside of your mouth or nose? Unknown Did you need to seek medical attention at a hospital or doctor's office? Yes When did it last happen?childhood If all above answers are "NO", may proceed with cephalosporin use.    Marland Kitchen Zoloft [Sertraline Hcl] Other (See Comments)    Reaction: "made crazy"  . Milk-Related Compounds Diarrhea  . Lactose Intolerance (Gi)   . Other Other (See Comments)    Laughing gas pt turned gray,general anesthesia    You were cared for by a hospitalist during your hospital stay. If you have any questions about your discharge medications or the care you received while you were in the hospital after you are discharged, you can call the unit and asked to speak with the hospitalist on call if the hospitalist that took care of you is not available.  Once you are discharged, your primary care physician will handle any further medical issues. Please note that no refills for any discharge medications will be authorized once you are discharged, as it is imperative that you return to your primary care physician (or establish a relationship with a primary care physician if you do not have one) for your aftercare needs so that they can reassess your need for medications and monitor your lab values.   Procedures/Studies: Ct Abdomen Pelvis Wo Contrast  Result Date: 03/20/2018 CLINICAL DATA:  History of cirrhosis of liver, dyspnea. Hepatic hydrothorax. Fluid with lymphocytes requiring rule out lymphoma. EXAM: CT ABDOMEN AND PELVIS WITHOUT CONTRAST TECHNIQUE: Multidetector CT imaging of the abdomen and pelvis was performed following the standard protocol without IV contrast. COMPARISON:  CT abdomen dated 03/06/2018 FINDINGS: Lower chest: Again noted is a large LEFT pleural effusion, incompletely imaged. Lingular consolidation is likely associated atelectasis. Hepatobiliary: Cirrhotic appearing liver. Gallstones are present within the nondistended gallbladder. No bile duct dilatation seen. Pancreas: Unremarkable. No pancreatic ductal dilatation or surrounding inflammatory changes. Spleen: Normal in size without focal abnormality. Adrenals/Urinary Tract: Kidneys are unremarkable without mass, stone or hydronephrosis. No perinephric fluid. No ureteral or bladder calculi identified. Bladder is obscured by overlying ascites. Stomach/Bowel: No dilated large or small bowel loops. 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 Appendix is normal. Stomach is unremarkable. Vascular/Lymphatic: Aortic atherosclerosis. Enlarged lymph node adjacent to the pancreatic head, 1.6 cm, better seen on the recent contrast-enhanced CT of 03/06/2018. No other enlarged lymph nodes appreciated in the abdomen or pelvis. Reproductive: Uterus appears normal. Ascites obscures visualization of the  bilateral adnexa. Other: Moderate amount of ascites within the abdomen and pelvis, largest component within the pelvis. No evidence of circumscribed fluid collection or abscess. No free intraperitoneal air. Musculoskeletal: No acute or suspicious osseous finding. Superficial soft tissues are unremarkable. IMPRESSION: 1. Cirrhotic liver. 2. Moderate amount of ascites within the abdomen and pelvis, largest component within the pelvis. 3. Enlarged lymph node adjacent to the pancreatic head, measuring 1.6 cm, better seen on the recent contrast-enhanced CT of 03/06/2016, favor reactive over neoplastic etiology given the absence of additional lymphadenopathy. 4. Large LEFT pleural effusion, incompletely imaged. 5. Cholelithiasis without evidence of acute cholecystitis. Aortic Atherosclerosis (ICD10-I70.0). Electronically Signed   By: Franki Cabot M.D.   On: 03/20/2018 14:20   Dg Chest 1 View  Result Date: 03/30/2018 CLINICAL DATA:  Status post thoracentesis. EXAM: CHEST  1 VIEW COMPARISON:  03/27/2017 FINDINGS: Midline trachea. Normal heart size. Small right pleural effusion is suspected. The left-sided effusion has decreased.  No pneumothorax. Improved aeration with decreased left-sided atelectasis. Mild subsegmental atelectasis remains at both lung bases. IMPRESSION: Decreased left pleural effusion, without pneumothorax. Improved aeration with mild bibasilar atelectasis remaining. Electronically Signed   By: Abigail Miyamoto M.D.   On: 03/30/2018 13:47   Dg Chest 1 View  Result Date: 03/26/2018 CLINICAL DATA:  Shortness of breath. EXAM: CHEST  1 VIEW COMPARISON:  Multiple prior exams most recent radiograph 03/24/2018. Most recent CT 03/14/2018 FINDINGS: Known left pleural effusion has increased in size over the past 2 days, now large in size. Associated compressive atelectasis throughout the left lung. Mild rightward mediastinal shift versus rotation. Only a small portion of aerated left perihilar lung.  Hypoventilatory right lung without acute finding. No visualized pneumothorax. IMPRESSION: Increased size of known left pleural effusion, now large in size. This causes mild mass effect in the mediastinum with rightward mediastinal shift. Electronically Signed   By: Keith Rake M.D.   On: 03/26/2018 19:37   Dg Chest 1 View  Result Date: 03/14/2018 CLINICAL DATA:  Status post thoracentesis EXAM: CHEST  1 VIEW COMPARISON:  March 14, 2018 12:56 p.m. FINDINGS: The heart size and mediastinal contours are within normal limits. There is small left pleural effusion significantly decreased compared prior exam. Consolidation left lung base is noted. There is no pneumothorax. The right lung is clear. The visualized skeletal structures are stable. IMPRESSION: There is small left pleural effusion, significantly decreased compared prior exam. Consolidation of left lung base is identified. There is no pneumothorax. Electronically Signed   By: Abelardo Diesel M.D.   On: 03/14/2018 16:19   Dg Chest 2 View  Result Date: 04/02/2018 CLINICAL DATA:  Shortness of breath EXAM: CHEST - 2 VIEW COMPARISON:  Three days ago FINDINGS: Large and significantly increased left pleural effusion. Borderline cardiopericardial enlargement, distorted by pleural fluid. Streaky density at the medial right base also seen previously. IMPRESSION: Large left pleural effusion with significant re-accumulation since 3 days ago. Electronically Signed   By: Monte Fantasia M.D.   On: 04/02/2018 09:39   Ct Chest Wo Contrast  Result Date: 03/14/2018 CLINICAL DATA:  Pleural effusion and dyspnea since yesterday. EXAM: CT CHEST WITHOUT CONTRAST TECHNIQUE: Multidetector CT imaging of the chest was performed following the standard protocol without IV contrast. COMPARISON:  03/14/2018 CXR FINDINGS: Cardiovascular: Common arterial branch of the right brachiocephalic left common carotid arteries. Atherosclerosis of left subclavian artery origin.  Nonaneurysmal atherosclerotic aorta. The unenhanced pulmonary vessels are unremarkable. The heart size is normal with small anterior pericardial effusion without thickening noted. Mediastinum/Nodes: Small subcentimeter prevascular and paratracheal lymph nodes. 1 cm short axis subcarinal lymph node. Patent trachea and mainstem bronchi. Unremarkable CT appearance of the esophagus. The thyroid gland is unremarkable without dominant mass. Lungs/Pleura: Moderate to large layering left effusion with adjacent atelectasis. The right lung is clear. No pneumothorax or dominant mass. Upper Abdomen: Cirrhotic appearance of the liver with moderate to large volume of ascites. Musculoskeletal: No chest wall mass or suspicious bone lesions identified. Mild thoracic spondylosis. IMPRESSION: 1. Moderate to large layering left pleural effusion with adjacent atelectasis. 2. Cirrhotic liver with moderate to large volume of ascites. Aortic Atherosclerosis (ICD10-I70.0). Electronically Signed   By: Ashley Royalty M.D.   On: 03/14/2018 22:17   [place] Intraoperative  Result Date: 03/19/2018 INDICATION: 63 year old with cirrhosis and recurrent left hydrothorax. Plan for nuclear medicine ascites - hydrothorax examination. Plan for an ultrasound-guided paracentesis and injection of radiopharmaceutical into the ascites. Left thoracentesis was performed prior to this  procedure. EXAM: ULTRASOUND GUIDED PARACENTESIS INJECTION OF RADIOPHARMACEUTICAL INTO ASCITES MEDICATIONS: None. COMPLICATIONS: None immediate. PROCEDURE: Informed written consent was obtained from the patient after a discussion of the risks, benefits and alternatives to treatment. A timeout was performed prior to the initiation of the procedure. Initial ultrasound scanning demonstrates a small amount of ascites within the right upper abdominal quadrant. The right upper abdomen was prepped and draped in the usual sterile fashion. 1% lidocaine with was used for local anesthesia.  Following this, a 6 Fr Safe-T-Centesis catheter was introduced. An ultrasound image was saved for documentation purposes. The paracentesis was performed. 60 mL of opaque yellow fluid was removed. The radiopharmaceutical was injected through the Safe-T-Centesis catheter. Catheter was flushed with sterile saline. The catheter was removed and a dressing was applied. The patient tolerated the procedure well without immediate post procedural complication. FINDINGS: Small amount of fluid around the liver. Small amount of fluid in the left lower quadrant. 60 mL of fluid was removed from the right upper quadrant. The radiopharmaceutical was injected into the right upper quadrant. IMPRESSION: Successful ultrasound-guided paracentesis yielding 60 mL of ascites. Fluid was sent for labs. Radiopharmaceutical was injected through the paracentesis catheter for the nuclear medicine examination. Electronically Signed   By: Markus Daft M.D.   On: 03/19/2018 17:06   Ir Guided Niel Hummer W Catheter Placement  Result Date: 04/04/2018 INDICATION: 63 year old female with cirrhosis complicated by ascites and left hepatic hydrothorax. Her renal and hepatic function has been fluctuating in her meld has been elevated between 20 in 23. The risks, benefits and alternatives to creation of a an elective TIPS versus placement of a tunneled pleural drainage catheter were discussed with the patient. At this time, she prefers tunneled pleural drainage catheter and hospice rather than proceeding with a TIPS given the relatively high rate of mortality for meld above 19. She presents for tunneled pleural drainage catheter. EXAM: ABSCESS DRAINAGE MEDICATIONS: The patient is currently admitted to the hospital and receiving intravenous antibiotics. The antibiotics were administered within an appropriate time frame prior to the initiation of the procedure. ANESTHESIA/SEDATION: Fentanyl 50 mcg IV; Versed 1 mg IV Moderate Sedation Time:  23 minutes The patient  was continuously monitored during the procedure by the interventional radiology nurse under my direct supervision. COMPLICATIONS: None immediate. PROCEDURE: Informed written consent was obtained from the patient after a thorough discussion of the procedural risks, benefits and alternatives. All questions were addressed. Maximal Sterile Barrier Technique was utilized including caps, mask, sterile gowns, sterile gloves, sterile drape, hand hygiene and skin antiseptic. A timeout was performed prior to the initiation of the procedure. The left chest was interrogated with ultrasound. There is a large pleural effusion. A suitable skin entry site was selected and marked. Local anesthesia was attained by infiltration with 1% lidocaine. A small dermatotomy was made. Under real-time sonographic guidance, an 18 gauge needle was advanced into the pleural space. A wire was subsequently advanced into the pleural space. A suitable skin exit site was selected slightly inferior and medial to the pleural entry site. Local anesthesia was again attained by infiltrating 1% lidocaine and a second small dermatotomy was made. The PleurX tunneled drainage catheter was then tunneled from the skin exit site to the dermatotomy overlying the pleural access site. A peel-away sheath was then advanced over a wire and into the left pleural space. The catheter was advanced through the peel-away sheath and into the pleural space. The catheter was then connected to vacuum tender bottles and a total  of 2.2 L was removed. The dermatotomy overlying the pleural entry site was closed with an interrupted inverted subdermal Vicryl suture and then sealed with Dermabond. The catheter was secured to the skin with 0 silk suture. Sterile bandages were placed. IMPRESSION: Successful placement of a left tunneled pleural drainage catheter with aspiration of 2.2 L of pleural fluid. Electronically Signed   By: Jacqulynn Cadet M.D.   On: 04/04/2018 13:07   US  Renal  Result Date: 03/30/2018 CLINICAL DATA:  Renal failure.  Cirrhosis. EXAM: RENAL / URINARY TRACT ULTRASOUND COMPLETE COMPARISON:  CT scan March 20, 2018 FINDINGS: Right Kidney: Renal measurements: 9 x 4.6 x 5.3 cm = volume: 114 mL. Contain in upper pole levin mm complex cysts. Contains a 7 mm lower pole cyst. Increased cortical echogenicity. Left Kidney: Renal measurements: 7.9 x 3.8 x 4.7 cm = volume: 73 mL. Increased cortical echogenicity. Bladder: Appears normal for degree of bladder distention. IMPRESSION: 1. Two small cysts in the right kidney, 1 of which is mildly complex. Medical renal disease with increased cortical echogenicity. No hydronephrosis. 2. Nodular liver consistent with cirrhosis.  Ascites. 3. The spleen is prominent measuring up to 13.2 cm. However, this volume of 357 cc does not meet the criteria for splenomegaly. Electronically Signed   By: Dorise Bullion III M.D   On: 03/30/2018 16:13   Nm Interstitial Rad Source Applic Complex  Result Date: 03/19/2018 CLINICAL DATA:  63 year old with cirrhosis and recurrent left hydrothorax. Patient is being evaluated for a hepatic hydrothorax. EXAM: NUCLEAR MEDICINE SULFUR COLLOID PERITONEAL SCINTIGRAPHY TECHNIQUE: Ultrasound-guided left thoracentesis was performed. 1.1 L of left pleural fluid was removed but not all of the fluid was removed. Subsequently, an ultrasound-guided paracentesis was performed in the right upper quadrant. 60 mL of peritoneal fluid was removed. Technetium 99 M sulfur colloid was injected through the paracentesis catheter. Paracentesis catheter was removed. RADIOPHARMACEUTICALS:  7 millicuries technetium 99 M sulfur colloid COMPARISON:  Chest CT 03/14/2018 FINDINGS: Imaging was obtained over 1 hour. The initial images demonstrated radiopharmaceutical throughout the peritoneal cavity. The largest peritoneal pockets are in the right upper quadrant and left lower quadrant. Over time, radiopharmaceutical starts to  accumulate in the left hemithorax. Findings are compatible with a trans diaphragmatic connection between the peritoneal cavity and the left pleural space. IMPRESSION: Study is positive for connection between the peritoneal space and left pleural space. 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 Electronically Signed   By: Markus Daft M.D.   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 Plan for nuclear medicine ascites - hydrothorax examination. Plan for an ultrasound-guided paracentesis and injection of radiopharmaceutical into the ascites. Left thoracentesis was performed prior to this procedure. EXAM: ULTRASOUND GUIDED PARACENTESIS INJECTION OF RADIOPHARMACEUTICAL INTO ASCITES MEDICATIONS: None. COMPLICATIONS: None immediate. PROCEDURE: Informed written consent was obtained from the patient after a discussion of the risks, benefits and alternatives to treatment. A timeout was performed prior to the initiation of the procedure. Initial ultrasound scanning demonstrates a small amount of ascites within the right upper abdominal quadrant. The right upper abdomen was prepped and draped in the usual sterile fashion. 1% lidocaine with was used for local anesthesia. Following this, a 6 Fr Safe-T-Centesis catheter was introduced. An ultrasound image was saved for documentation purposes. The paracentesis was performed. 60 mL of opaque yellow fluid was removed. The radiopharmaceutical was injected through the Safe-T-Centesis catheter. Catheter was flushed with  sterile saline. The catheter was removed and a dressing was applied. The patient tolerated the procedure well without immediate post procedural complication. FINDINGS: Small amount of fluid around the liver. Small amount of fluid in the left lower quadrant. 60 mL of fluid was removed from the right upper quadrant. The radiopharmaceutical was injected  into the right upper quadrant. IMPRESSION: Successful ultrasound-guided paracentesis yielding 60 mL of ascites. Fluid was sent for labs. Radiopharmaceutical was injected through the paracentesis catheter for the nuclear medicine examination. Electronically Signed   By: Markus Daft M.D.   On: 03/19/2018 17:06   Ir US Guide Vasc Access Left  Result Date: 04/04/2018 INDICATION: 63 year old female with cirrhosis complicated by ascites and left hepatic hydrothorax. Her renal and hepatic function has been fluctuating in her meld has been elevated between 20 in 23. The risks, benefits and alternatives to creation of a an elective TIPS versus placement of a tunneled pleural drainage catheter were discussed with the patient. At this time, she prefers tunneled pleural drainage catheter and hospice rather than proceeding with a TIPS given the relatively high rate of mortality for meld above 19. She presents for tunneled pleural drainage catheter. EXAM: Tunneled pleural drain placement, Left MEDICATIONS: The patient is currently admitted to the hospital and receiving intravenous antibiotics. The antibiotics were administered within an appropriate time frame prior to the initiation of the procedure. ANESTHESIA/SEDATION: Fentanyl 50 mcg IV; Versed 1 mg IV Moderate Sedation Time: 23 minutes The patient was continuously monitored during the procedure by the interventional radiology nurse under my direct supervision. COMPLICATIONS: None immediate. PROCEDURE: Informed written consent was obtained from the patient after a thorough discussion of the procedural risks, benefits and alternatives. All questions were addressed. Maximal Sterile Barrier Technique was utilized including caps, mask, sterile gowns, sterile gloves, sterile drape, hand hygiene and skin antiseptic. A timeout was performed prior to the initiation of the procedure. The left chest was interrogated with ultrasound. There is a large pleural effusion. A suitable skin  entry site was selected and marked. Local anesthesia was attained by infiltration with 1% lidocaine. A small dermatotomy was made. Under real-time sonographic guidance, an 18 gauge needle was advanced into the pleural space. A wire was subsequently advanced into the pleural space. A suitable skin exit site was selected slightly inferior and medial to the pleural entry site. Local anesthesia was again attained by infiltrating 1% lidocaine and a second small dermatotomy was made. The PleurX tunneled drainage catheter was then tunneled from the skin exit site to the dermatotomy overlying the pleural access site. A peel-away sheath was then advanced over a wire and into the left pleural space. The catheter was advanced through the peel-away sheath and into the pleural space. The catheter was then connected to vacuum tender bottles and a total of 2.2 L was removed. The dermatotomy overlying the pleural entry site was closed with an interrupted inverted subdermal Vicryl suture and then sealed with Dermabond. The catheter was secured to the skin with 0 silk suture. Sterile bandages were placed. IMPRESSION: Successful placement of a left tunneled pleural drainage catheter with aspiration of 2.2 L of pleural fluid. Electronically Signed   By: Jacqulynn Cadet M.D.   On: 04/04/2018 15:47   Dg Bone Density (dxa)  Result Date: 03/11/2018 EXAM: DUAL X-RAY ABSORPTIOMETRY (DXA) FOR BONE MINERAL DENSITY IMPRESSION: Dear Dr, Lutricia Feil, Your patient Mabeline Varas completed a FRAX assessment on 03/11/2018 using the [place] (analysis version: 14.10) manufactured by [employer]. The following  summarizes the results of our evaluation. PATIENT BIOGRAPHICAL: Name: Idamay, Hosein Patient ID: [id number] Birth Date: [birth year] Height:    62.5 in. Gender:     Female    [age]       Weight:    158.4 lbs. Ethnicity:  White                            Exam Date: 03/11/2018 FRAX* RESULTS:  (version: 3.5) 10-year  Probability of Fracture1 Major Osteoporotic Fracture2 Hip Fracture 8.1% 0.7% Population: [place] (Caucasian) Risk Factors: None Based on Femur (Left) Neck BMD 1 -The 10-year probability of fracture may be lower than reported if the patient has received treatment. 2 -Major Osteoporotic Fracture: Clinical Spine, Forearm, Hip or Shoulder *FRAX is a Materials engineer of the [employer] for Metabolic Bone Disease, a [place] (WHO) [facility]. ASSESSMENT: The probability of a major osteoporotic fracture is 8.1% within the next ten years. The probability of a hip fracture is 0.7% within the next ten years. . Technologist: [employer] PATIENT BIOGRAPHICAL: Name: Elverta, Dimiceli Patient ID: [id number] Birth Date: May 30, 1955 Height: 62.5 in. Gender: Female Exam Date: 03/11/2018 Weight: 158.4 lbs. Indications: Asthma, Caucasian, Cirrhosis, Early Menopause, Postmenopausal, Previous Smoker Fractures: Treatments: Gabapentin, primidone ASSESSMENT: The BMD measured at Femur Neck Left is 0.833 g/cm2 with a T-score of -1.5. This patient is considered osteopenic according to [facility]) criteria. The quality of the scan is good. Site Region Measured Measured WHO Young Adult BMD Date       Age      Classification T-score AP Spine L1-L4 03/11/2018 [age] Osteopenia -1.5 1.007 g/cm2 DualFemur Neck Left 03/11/2018 63.0 Osteopenia -1.5 0.833 g/cm2 World Health Organization [employer]) criteria for post-menopausal, Caucasian Women: Normal:       T-score at or above -1 SD Osteopenia:   T-score between -1 and -2.5 SD Osteoporosis: T-score at or below -2.5 SD RECOMMENDATIONS: 1. All patients should optimize calcium and vitamin D intake. 2. Consider FDA-approved medical therapies in postmenopausal women and men aged 51 years and older, based on the following: a. A hip or vertebral(clinical or morphometric) fracture b. T-score < -2.5 at the femoral neck or spine after appropriate evaluation  to exclude secondary causes c. Low bone mass (T-score between -1.0 and -2.5 at the femoral neck or spine) and a 10-year probability of a hip fracture > 3% or a 10-year probability of a major osteoporosis-related fracture > 20% based on the [place]-adapted WHO algorithm d. Clinician judgment and/or patient preferences may indicate treatment for people with 10-year fracture probabilities above or below these levels FOLLOW-UP: People with diagnosed cases of osteoporosis or at high risk for fracture should have regular bone mineral density tests. For patients eligible for Medicare, routine testing is allowed once every 2 years. The testing frequency can be increased to one year for patients who have rapidly progressing disease, those who are receiving or discontinuing medical therapy to restore bone mass, or have additional risk factors. I have reviewed this report, and agree with the above findings. [facility] Radiology Electronically Signed   By: Lowella Grip III M.D.   On: 03/11/2018 11:23   Dg Chest Port 1 View  Result Date: 04/06/2018 CLINICAL DATA:  Follow-up pleural effusion. EXAM: PORTABLE CHEST 1 VIEW COMPARISON:  04/04/2018 FINDINGS: Left-sided chest tube remains in place but is migrated within the pleural space. There are persistent bilateral  effusions with dependent atelectasis. The effusion may be enlarging on the left. IMPRESSION: Persistent effusions with lower lung atelectasis. The effusion may be enlarging on the left, despite the presence of a pleural catheter. Electronically Signed   By: Nelson Chimes M.D.   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 Recently underwent placement of a tunneled pleural drainage catheter. EXAM: PORTABLE CHEST 1 VIEW COMPARISON:  Preoperative chest x-ray 04/02/2018 FINDINGS: New tunneled left pleural drainage catheter is well positioned in the medial aspect of the left inferior  pleural space. Significant reduction in the volume of pleural fluid compared to 04/02/2018. A moderate right pleural effusion exists. The cardiac and mediastinal contours are within normal limits. No evidence of pneumothorax. No acute osseous abnormality. IMPRESSION: New left PleurX catheter with significant reduced volume of the left pleural effusion. Moderate right pleural effusion. Electronically Signed   By: Jacqulynn Cadet M.D.   On: 04/04/2018 16:31   Dg Chest Port 1 View  Result Date: 03/28/2018 CLINICAL DATA:  Recurrent effusion EXAM: PORTABLE CHEST 1 VIEW COMPARISON:  03/27/2018, 03/26/2018, 03/24/2018 FINDINGS: Interval diffuse hazy opacity left thorax likely due to layering effusion, moderate in size. This is increased compared to prior. Underlying edema not excluded. Stable cardiomediastinal silhouette. Left basilar airspace disease. IMPRESSION: Increased moderate left pleural effusion with development of diffuse hazy opacity in the left thorax which may reflect layering fluid and or edema. Increased airspace disease at the left base. Electronically Signed   By: Donavan Foil M.D.   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 EXAM: PORTABLE CHEST 1 VIEW COMPARISON:  Chest radiograph-03/26/2018 FINDINGS: Grossly unchanged cardiac silhouette and mediastinal contours. Interval reduction in persistent moderate size left-sided effusion post thoracentesis. No pneumothorax. Improved aeration of the left mid and lower lung with persistent left basilar heterogeneous/consolidative opacities. No new focal airspace opacities. No definite right-sided pleural effusion. No evidence of edema. No acute osseous abnormalities. IMPRESSION: 1. Interval reduction in persistent moderate size left-sided effusion post thoracentesis. No pneumothorax. 2. Improved aeration of the left lung base with  persistent left basilar atelectasis. Electronically Signed   By: Sandi Mariscal M.D.   On: 03/27/2018 13:34   Dg Chest Port 1 View  Result Date: 03/24/2018 CLINICAL DATA:  Post left thoracentesis EXAM: PORTABLE CHEST 1 VIEW COMPARISON:  03/21/2018 FINDINGS: Moderate to large left pleural effusion minimally changed since prior study. No pneumothorax. Left basilar opacity, likely atelectasis. No confluent opacity on the right. Heart is upper limits normal in size. IMPRESSION: Moderate to large-sized left pleural effusion minimally changed. No pneumothorax. Continued left base atelectasis or infiltrate. Electronically Signed   By: Rolm Baptise M.D.   On: 03/24/2018 12:46   Dg Chest Port 1 View  Result Date: 03/19/2018 CLINICAL DATA:  Status post thoracentesis. EXAM: PORTABLE CHEST 1 VIEW COMPARISON:  03/14/2018 FINDINGS: 1702 hours. Moderate to large left pleural effusion noted without evidence for pneumothorax. Left base collapse/consolidation. The cardio pericardial silhouette is enlarged. The visualized bony structures of the thorax are intact. Telemetry leads overlie the chest. IMPRESSION: 1. No evidence for pneumothorax status post thoracentesis. 2. Moderate to large left pleural effusion. Electronically Signed   By: Misty Stanley M.D.   On: 03/19/2018 18:56   Dg Chest Portable 1 View  Result Date: 03/14/2018 CLINICAL DATA:  Shortness  of breath. EXAM: PORTABLE CHEST 1 VIEW COMPARISON:  Chest x-ray 03/06/2018. FINDINGS: Mediastinum and hilar structures normal. Large left pleural effusion, increased in size from prior exam. Underlying left lung atelectasis/infiltrate can not be excluded. No pneumothorax. Heart size most likely stable. IMPRESSION: Large left pleural effusion, increased from prior exam. Electronically Signed   By: Melbeta   On: 03/14/2018 13:19   Mm 3d Screen Breast Bilateral  Result Date: 03/12/2018 CLINICAL DATA:  Screening. EXAM: DIGITAL SCREENING BILATERAL MAMMOGRAM WITH TOMO  AND CAD COMPARISON:  Previous exam(s). ACR Breast Density Category b: There are scattered areas of fibroglandular density. FINDINGS: There are no findings suspicious for malignancy. Images were processed with CAD. IMPRESSION: No mammographic evidence of malignancy. A result letter of this screening mammogram will be mailed directly to the patient. RECOMMENDATION: Screening mammogram in one year. (Code:SM-B-01Y) BI-RADS CATEGORY  1: Negative. Electronically Signed   By: Dorise Bullion III M.D   On: 03/12/2018 14:44   Nm Hepato Biliary Leak  Result Date: 03/17/2018 CLINICAL DATA:  Cholelithiasis. Nausea and vomiting. Hepatic cirrhosis. EXAM: NUCLEAR MEDICINE HEPATOBILIARY IMAGING TECHNIQUE: Sequential images of the abdomen were obtained out to 60 minutes following intravenous administration of radiopharmaceutical. RADIOPHARMACEUTICALS:  5.1 mCi Tc-6m Choletec IV COMPARISON:  CT on 03/14/2018 FINDINGS: Delayed uptake and biliary excretion of activity by the liver is seen, consistent with known hepatic cirrhosis. Gallbladder activity is visualized, consistent with patency of cystic duct. Biliary activity passes into small bowel, consistent with patent common bile duct. IMPRESSION: No evidence of acute cholecystitis or biliary ductal dilatation. Hepatocellular dysfunction, consistent with hepatic cirrhosis. Electronically Signed   By: JEarle GellM.D.   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 Request for therapeutic thoracentesis today in IR. EXAM: ULTRASOUND GUIDED LEFT THORACENTESIS MEDICATIONS: 10 mL 1% lidocaine. COMPLICATIONS: None immediate. PROCEDURE: An ultrasound guided thoracentesis was thoroughly discussed with the patient and questions answered. The benefits, risks, alternatives and complications were also discussed. The patient understands  and wishes to proceed with the procedure. Written consent was obtained. Ultrasound was performed to localize and mark an adequate pocket of fluid in the left chest. The area was then prepped and draped in the normal sterile fashion. 1% Lidocaine was used for local anesthesia. Under ultrasound guidance a 6 Fr Safe-T-Centesis catheter was introduced. Thoracentesis was performed. The catheter was removed and a dressing applied. FINDINGS: A total of approximately 2.1 L of chylous fluid was removed. IMPRESSION: Successful ultrasound guided left thoracentesis yielding 2.1 L of pleural fluid. Read by SCandiss Norse PA-C Electronically Signed   By: MJerilynn Mages  Shick M.D.   On: 03/30/2018 12:54   [place]Thoracentesis Asp Pleural Space W/img Guide  Result Date: 03/27/2018 INDICATION: History of left-sided hepatic hydrothorax now with recurrent symptomatic left-sided pleural effusion. Please perform ultrasound-guided thoracentesis for therapeutic purposes. EXAM: [place]ORACENTESIS ASP PLEURAL SPACE W/IMG GUIDE COMPARISON:  Chest radiograph-03/26/2018 MEDICATIONS: None. COMPLICATIONS: None immediate. TECHNIQUE: Informed written consent was obtained from the patient after a discussion of the risks, benefits and alternatives to treatment. A timeout was performed prior to the initiation of the procedure. Initial ultrasound scanning demonstrates a large minimally complex though predominantly anechoic left-sided pleural effusion. The lower chest was prepped and draped in the usual sterile fashion. 1% lidocaine was used for local anesthesia. An ultrasound image was saved for documentation purposes. An 8 Fr Safe-T-Centesis catheter  was introduced. The thoracentesis was performed. The catheter was removed and a dressing was applied. The patient tolerated the procedure well without immediate post procedural complication. The patient was escorted to have an upright chest radiograph. FINDINGS: A total of approximately 2.2 liters of serous fluid  was removed. IMPRESSION: Successful ultrasound-guided left sided thoracentesis yielding 2.2 liters of pleural fluid. Electronically Signed   By: Sandi Mariscal M.D.   On: 03/27/2018 13:37   US Thoracentesis Asp Pleural Space W/img Guide  Result Date: 03/24/2018 INDICATION: Patient with history of hepatic hydrothorax, cirrhosis and recurrent left pleural effusion who presents today originally for paracentesis in order to obtain flow cytometry to r/o malignancy prior to possible TIPS procedure. Limited abdominal US shows scant peritoneal fluid not amenable to percutaneous drainage. Diagnostic and therapeutic thoracentesis performed in order to obtain requested labs in lieu of paracentesis given patient's history of hepatic hydrothorax. EXAM: ULTRASOUND GUIDED LEFT THORACENTESIS MEDICATIONS: 20 mL 1% lidocaine. COMPLICATIONS: None immediate. PROCEDURE: An ultrasound guided thoracentesis was thoroughly discussed with the patient and questions answered. The benefits, risks, alternatives and complications were also discussed. The patient understands and wishes to proceed with the procedure. Written consent was obtained. Ultrasound was performed to localize and mark an adequate pocket of fluid in the left chest. The area was then prepped and draped in the normal sterile fashion. 1% Lidocaine was used for local anesthesia. Under ultrasound guidance a 6 Fr Safe-T-Centesis catheter was introduced. Thoracentesis was performed. The catheter was removed and a dressing applied. FINDINGS: A total of approximately 1.1 L of pink milky fluid was removed. Samples were sent to the laboratory as requested by the clinical team. IMPRESSION: Successful ultrasound guided left thoracentesis yielding 1.1 L of pleural fluid. Read by Candiss Norse, PA-C Electronically Signed   By: Lucrezia Europe M.D.   On: 03/24/2018 12:49   US Thoracentesis Asp Pleural Space W/img Guide  Result Date: 03/19/2018 INDICATION: 63 year old with cirrhosis and  recurrent left hydrothorax. Patient is being evaluated for a connection between the peritoneal and left pleural space with a nuclear medicine examination. Patient has a large amount of left pleural fluid on ultrasound examination. Plan to remove some of the left pleural fluid prior to the nuclear medicine examination. EXAM: ULTRASOUND GUIDED LEFT THORACENTESIS MEDICATIONS: None. COMPLICATIONS: None immediate. PROCEDURE: An ultrasound guided thoracentesis was thoroughly discussed with the patient and questions answered. The benefits, risks, alternatives and complications were also discussed. The patient understands and wishes to proceed with the procedure. Written consent was obtained. Ultrasound was performed to localize and mark an adequate pocket of fluid in the left chest. The area was then prepped and draped in the normal sterile fashion. 1% Lidocaine was used for local anesthesia. Under ultrasound guidance a 6 Fr Safe-T-Centesis catheter was introduced. Thoracentesis was performed. The catheter was removed and a dressing applied. FINDINGS: A total of approximately 1.1 L of opaque yellow fluid was removed. Samples were sent to the laboratory as requested by the clinical team. IMPRESSION: Successful ultrasound guided left thoracentesis yielding 1.1 L of pleural fluid. Electronically Signed   By: Markus Daft M.D.   On: 03/19/2018 17:11   US Thoracentesis Asp Pleural Space W/img Guide  Result Date: 03/14/2018 CLINICAL DATA:  Recurrent left pleural effusion. EXAM: ULTRASOUND GUIDED LEFT THORACENTESIS COMPARISON:  None. PROCEDURE: An ultrasound guided thoracentesis was thoroughly discussed with the patient and questions answered. The benefits, risks, alternatives and complications were also discussed. The patient understands and wishes to proceed with the procedure. Written  consent was obtained. Ultrasound was performed to localize and mark an adequate pocket of fluid in the left chest. The area was then prepped  and draped in the normal sterile fashion. 1% Lidocaine was used for local anesthesia. Under ultrasound guidance a 6 French Safe-T-Centesis catheter was introduced. Thoracentesis was performed. The catheter was removed and a dressing applied. COMPLICATIONS: None FINDINGS: A total of approximately 2.3 L of cloudy, yellowish-brown fluid was removed. A fluid sample was sent for laboratory analysis. IMPRESSION: Successful ultrasound guided left thoracentesis yielding 2.3 L of pleural fluid. Electronically Signed   By: Aletta Edouard M.D.   On: 03/14/2018 16:13      The results of significant diagnostics from this hospitalization (including imaging, microbiology, ancillary and laboratory) are listed below for reference.     Microbiology: No results found for this or any previous visit (from the past 240 hour(s)).   Labs: BNP (last 3 results) Recent Labs    03/14/18 1335  BNP 42.8   Basic Metabolic Panel: Recent Labs  Lab 04/02/18 0330 04/04/18 0408 04/05/18 0513  NA 141 141 136  K 4.5 4.3 4.5  CL 109 103 106  CO2 27 22 23   GLUCOSE 88 82 93  BUN 34* 37* 36*  CREATININE 2.27* 2.37* 2.32*  CALCIUM 10.2 10.6* 9.4  PHOS 3.5  --   --    Liver Function Tests: Recent Labs  Lab 04/02/18 0330 04/07/18 1157  AST 24  --   ALT 16  --   ALKPHOS 41  --   BILITOT 1.8*  --   PROT 6.3*  --   ALBUMIN 4.8 4.3   No results for input(s): LIPASE, AMYLASE in the last 168 hours. No results for input(s): AMMONIA in the last 168 hours. CBC: Recent Labs  Lab 04/02/18 0330 04/07/18 0550  WBC 2.2* 3.1*  HGB 9.0* 9.6*  HCT 27.8* 28.3*  MCV 101.1* 98.6  PLT 53* 55*   Cardiac Enzymes: No results for input(s): CKTOTAL, CKMB, CKMBINDEX, TROPONINI in the last 168 hours. BNP: Invalid input(s): POCBNP CBG: No results for input(s): GLUCAP in the last 168 hours. D-Dimer No results for input(s): DDIMER in the last 72 hours. Hgb A1c No results for input(s): HGBA1C in the last 72 hours. Lipid  Profile No results for input(s): CHOL, HDL, LDLCALC, TRIG, CHOLHDL, LDLDIRECT in the last 72 hours. Thyroid function studies No results for input(s): TSH, T4TOTAL, T3FREE, THYROIDAB in the last 72 hours.  Invalid input(s): FREET3 Anemia work up No results for input(s): VITAMINB12, FOLATE, FERRITIN, TIBC, IRON, RETICCTPCT in the last 72 hours. Urinalysis    Component Value Date/Time   COLORURINE YELLOW 03/29/2018 2058   APPEARANCEUR CLEAR 03/29/2018 2058   LABSPEC 1.012 03/29/2018 2058   PHURINE 5.0 03/29/2018 2058   GLUCOSEU NEGATIVE 03/29/2018 2058   HGBUR LARGE (A) 03/29/2018 2058   BILIRUBINUR NEGATIVE 03/29/2018 2058   [place] NEGATIVE 03/29/2018 2058   PROTEINUR NEGATIVE 03/29/2018 2058   NITRITE NEGATIVE 03/29/2018 2058   LEUKOCYTESUR TRACE (A) 03/29/2018 2058   Sepsis Labs Invalid input(s): PROCALCITONIN,  WBC,  LACTICIDVEN Microbiology No results found for this or any previous visit (from the past 240 hour(s)).   Time coordinating discharge:  I have spent 35 minutes face to face with the patient and on the ward discussing the patients care, assessment, plan and disposition with other care givers. >50% of the time was devoted counseling the patient about the risks and benefits of treatment/Discharge disposition and coordinating care.   SIGNED:  Ankit Arsenio Loader, MD  [facility] 04/08/2018, 10:18 AM   If 7PM-7AM, please contact night-coverage [email or web address]

## 2018-04-08 NOTE — Progress Notes (Signed)
Spoke with Dr Durenda Guthrie from [facility]. They are unable to reach patient's husband at this time to help set up things at home. She is requesting we keep the patient overnight at the hospital until they are able to get in touch with him. If not tonight, they will be able to accommodate her at their facility temporarily tomorrow morning until home arrangements are made. Unsafe for patient to be discharge until we are able to get in touch with her husband.   Angela from [facility] update about the plan as well.   Call with questions.

## 2018-04-08 NOTE — TOC Progression Note (Signed)
Transition of Care [facility]) - Progression Note    Patient Details  Name: Kelly Mclean MRN: [id number] Date of Birth: 1955/03/23  [facility] CM/SW [place], LCSW Phone Number: 04/08/2018, 4:09 PM  Clinical Narrative:    Per [facility], they are trying to get the patient's husband on the phone to set up the services. They are not able to send patient home without confirmation of his schedule. They have left him multiple voicemails. CSW also left voicemail letting him know that patient has been discharged.        Expected Discharge Plan and Services   Discharge Planning Services: CM Consult     Expected Discharge Date: 04/08/18                         Social Determinants of Health (SDOH) Interventions    Readmission Risk Interventions 30 Day Unplanned Readmission Risk Score     Admission (Current) from 03/28/2018 in [facility]  30 Day Unplanned Readmission Risk Score (%)  18 Filed at 04/08/2018 1200     This score is the patient's risk of an unplanned readmission within 30 days of being discharged (0 -100%). The score is based on dignosis, age, lab data, medications, orders, and past utilization.   Low:  0-14.9   Medium: 15-21.9   High: 22-29.9   Extreme: 30 and above       No flowsheet data found.

## 2018-04-09 NOTE — Progress Notes (Signed)
NCM spoke with [facility]/Nurse Loren via phone regarding d/cplan. Loren states [facility] is arranging pt pick up @ 12 noon today. Pt and beside nurse made aware. Whitman Hero RN,BSN,CM

## 2018-04-09 NOTE — Progress Notes (Signed)
Nsg Discharge Note  Admit Date:  03/28/2018 Discharge date: 04/09/2018   Kelly Mclean to be D/C'd Home per MD order.  AVS completed.  Copy for chart, and copy for patient signed, and dated. Patient/caregiver able to verbalize understanding.  Discharge Medication: Allergies as of 04/09/2018      Reactions   Biaxin [clarithromycin] Shortness Of Breath, Rash   Penicillins Anaphylaxis, Other (See Comments)   Did it involve swelling of the face/tongue/throat, SOB, or low BP? Unknown Did it involve sudden or severe rash/hives, skin peeling, or any reaction on the inside of your mouth or nose? Unknown Did you need to seek medical attention at a hospital or doctor's office? Yes When did it last happen?childhood If all above answers are "NO", may proceed with cephalosporin use.   Zoloft [sertraline Hcl] Other (See Comments)   Reaction: "made crazy"   Milk-related Compounds Diarrhea   Lactose Intolerance (gi)    Other Other (See Comments)   Laughing gas pt turned gray,general anesthesia      Medication List    TAKE these medications   albuterol (2.5 MG/3ML) 0.083% nebulizer solution Commonly known as:  PROVENTIL Inhale 3 mLs (2.5 mg total) into the lungs every 6 (six) hours as needed for wheezing or shortness of breath.   busPIRone 10 MG tablet Commonly known as:  BUSPAR Take 10 mg by mouth 3 (three) times daily.   calcium carbonate 500 MG chewable tablet Commonly known as:  TUMS - dosed in mg elemental calcium Chew 2 tablets by mouth 2 (two) times daily.   citalopram 20 MG tablet Commonly known as:  CELEXA Take 20 mg by mouth daily.   dexamethasone 0.5 MG tablet Commonly known as:  DECADRON Take 1 tablet (0.5 mg total) by mouth 2 (two) times daily for 30 days.   docusate sodium 100 MG capsule Commonly known as:  COLACE Take 100 mg by mouth 2 (two) times daily.   famotidine 40 MG tablet Commonly known as:  PEPCID Take 40 mg by mouth at bedtime.   furosemide 20 MG  tablet Commonly known as:  LASIX Take 1 tablet (20 mg total) by mouth daily.   gabapentin 100 MG capsule Commonly known as:  NEURONTIN Take 200 mg by mouth at bedtime.   primidone 50 MG tablet Commonly known as:  MYSOLINE Take 50 mg by mouth 2 (two) times daily.   traMADol 50 MG tablet Commonly known as:  ULTRAM Take 1 tablet (50 mg total) by mouth every 12 (twelve) hours as needed for up to 5 days for moderate pain.       Discharge Assessment: Vitals:   04/08/18 2035 04/09/18 0434  BP: (!) 140/59 (!) 125/57  Pulse: 77 78  Resp: 16 15  Temp: 98.7 F (37.1 C) 98.2 F (36.8 C)  SpO2: 99% 97%   Skin clean, dry and intact without evidence of skin break down, no evidence of skin tears noted. IV catheter discontinued intact. Site without signs and symptoms of complications - no redness or edema noted at insertion site, patient denies c/o pain - only slight tenderness at site.  Dressing with slight pressure applied.  D/c Instructions-Education: Discharge instructions given to patient/family with verbalized understanding. D/c education completed with patient/family including follow up instructions, medication list, d/c activities limitations if indicated, with other d/c instructions as indicated by MD - patient able to verbalize understanding, all questions fully answered. Patient instructed to return to ED, call 911, or call MD for any changes in condition.  Patient escorted to [facility] via there own transport  Kelly N Barak Bialecki, RN 04/09/2018 12:18 PM

## 2018-04-09 NOTE — Progress Notes (Signed)
PROGRESS NOTE    Kelly Mclean  OAC:[id number] DOB: [birth year] DOA: 03/28/2018 PCP: Marnee Guarneri, MD   Brief Narrative:  63 year old with history of [place] cirrhosis, recurrent hepatic hydrothorax was sent to this hospital from [facility] for consideration of possible TIPS procedure.  Patient is required multiple thoracentesis.  Upon transfer here it was determined patient is not a good candidate for TIPS and Pleurx catheter was placed on 3/13.   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  Pleurx catheter placed.  Plans to drain this 3 times weekly-Monday, Wednesday and Friday.  Wean off oxygen.  Advance goals of care - Given her end-stage liver cirrhosis which is fairly decompensated- opted for hospice.  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  Seen by nephrology.  No longer wants any further treatment as patient wants to be comfortable.  Appreciate nephrology input.  Pancytopenia -Secondary to underlying cirrhosis and chronic kidney disease.  Closely monitor this.  History of depression/anxiety - Continue BuSpar and Celexa  DVT prophylaxis: SCDs Code Status: DNR Family Communication: None at bedside Disposition Plan: Home with home health, [facility] to follow  Consultants:    Nephrology  Radiology  Procedures:   Pleurx catheter placement 3/13  Antimicrobials:   None   Subjective: Feels ok no complaints.  Review of Systems Otherwise negative except as per HPI, including: General = no fevers, chills, dizziness, malaise, fatigue HEENT/EYES = negative for pain, redness, loss of vision, double vision, blurred vision, loss of hearing, sore throat, hoarseness, dysphagia Cardiovascular= negative for chest pain, palpitation, murmurs, lower extremity swelling Respiratory/lungs= negative for shortness of breath, cough, hemoptysis, wheezing, mucus production Gastrointestinal= negative for nausea, vomiting,, abdominal pain, melena, hematemesis Genitourinary= negative for Dysuria, Hematuria, Change in Urinary Frequency MSK = Negative for arthralgia, myalgias, Back Pain, Joint swelling  Neurology= Negative for headache, seizures, numbness, tingling  Psychiatry= Negative for anxiety, depression, suicidal and homocidal ideation Allergy/Immunology= Medication/Food allergy as listed  Skin= Negative for Rash, lesions, ulcers, itching   Objective: Vitals:   04/08/18 1409 04/08/18 2035 04/09/18 0434 04/09/18 0439  BP: 104/90 (!) 140/59 (!) 125/57   Pulse: 81 77 78   Resp: 18 16 15    Temp: 98.3 F (36.8 C) 98.7 F (37.1 C) 98.2 F (36.8 C)   TempSrc: Oral Oral Oral   SpO2: 100% 99% 97%   Weight:    65.8 kg  Height:        Intake/Output Summary (Last 24 hours) at 04/09/2018 1109 Last data filed at 04/08/2018 1300 Gross per 24 hour  Intake 180 ml  Output -  Net 180 ml   Filed Weights   04/07/18 0601 04/08/18 0437 04/09/18 0439  Weight: 66.1 kg 65.1 kg 65.8 kg    Examination: Constitutional: NAD, calm, comfortable Eyes: PERRL, lids and conjunctivae normal ENMT: Mucous membranes are moist. Posterior pharynx clear of  any exudate or lesions.Normal dentition.  Neck: normal, supple, no masses, no thyromegaly Respiratory: diminished BS at the bases.   Cardiovascular: Regular rate and rhythm, no murmurs / rubs / gallops. No extremity edema. 2+ pedal pulses. No carotid bruits.  Abdomen: no tenderness, no masses palpated. No hepatosplenomegaly. Bowel sounds positive.  Musculoskeletal: no clubbing / cyanosis. No joint deformity upper and lower extremities. Good ROM, no contractures. Normal muscle tone.  Skin: no rashes, lesions, ulcers. No induration Neurologic: CN 2-12 grossly intact. Sensation intact, DTR normal. Strength 5/5 in all 4.  Psychiatric: Normal judgment and insight. Alert and oriented x 3. Normal mood.  PLeux cather in place.   Data Reviewed:   CBC: Recent Labs  Lab 04/07/18 0550  WBC 3.1*  HGB 9.6*  HCT 28.3*  MCV 98.6  PLT 55*   Basic Metabolic Panel: Recent Labs  Lab 04/04/18 0408 04/05/18 0513  NA 141 136  K 4.3 4.5  CL 103 106  CO2 22 23  GLUCOSE 82 93  BUN 37* 36*  CREATININE 2.37* 2.32*  CALCIUM 10.6* 9.4   GFR: Estimated Creatinine Clearance: 22.7 mL/min (A) (by C-G formula based on SCr of 2.32 mg/dL (H)). Liver Function Tests: Recent Labs  Lab 04/07/18 1157  ALBUMIN 4.3   No results for input(s): LIPASE, AMYLASE in the last 168 hours. No results for input(s): AMMONIA in the last 168 hours. Coagulation Profile: No results for input(s): INR, PROTIME in the last 168 hours. Cardiac Enzymes: No results for input(s): CKTOTAL, CKMB, CKMBINDEX, TROPONINI in the last 168 hours. BNP (last 3 results) No results for input(s): PROBNP in the last 8760 hours. HbA1C: No results for input(s): HGBA1C in the last 72 hours. CBG: No results for input(s): GLUCAP in the last 168 hours. Lipid Profile: No results for input(s): CHOL, HDL, LDLCALC, TRIG, CHOLHDL, LDLDIRECT in the last 72 hours. Thyroid Function Tests: No results for input(s): TSH, T4TOTAL, FREET4, T3FREE, THYROIDAB in the last 72 hours. Anemia Panel: No results for input(s): VITAMINB12, FOLATE, FERRITIN, TIBC, IRON, RETICCTPCT in the last 72  hours. Sepsis Labs: No results for input(s): PROCALCITON, LATICACIDVEN in the last 168 hours.  No results found for this or any previous visit (from the past 240 hour(s)).       Radiology Studies: No results found.      Scheduled Meds: . sodium chloride   Intravenous Once  . busPIRone  10 mg Oral TID  . calcium carbonate  2 tablet Oral BID  . citalopram  20 mg Oral Daily  . dexamethasone  0.5 mg Oral BID  . docusate  100 mg Oral BID  . famotidine  10 mg Oral QHS  . gabapentin  200 mg Oral QHS  . primidone  50 mg Oral BID   Continuous Infusions:   LOS: 12 days   Time spent= 20 mins    Shari Natt Arsenio Loader, MD [facility]  If 7PM-7AM, please contact night-coverage [email or web address] 04/09/2018, 11:09 AM

## 2018-04-16 ENCOUNTER — Other Ambulatory Visit: Payer: Self-pay

## 2018-04-16 ENCOUNTER — Inpatient Hospital Stay
Admission: EM | Admit: 2018-04-16 | Discharge: 2018-04-18 | DRG: 641 | Disposition: A | Discharge status: Home / Self Care | Payer: Medicare (Managed Care) | Attending: Internal Medicine | Admitting: Internal Medicine

## 2018-04-16 DIAGNOSIS — N184 Chronic kidney disease, stage 4 (severe): Secondary | ICD-10-CM | POA: Diagnosis present

## 2018-04-16 DIAGNOSIS — J9611 Chronic respiratory failure with hypoxia: Secondary | ICD-10-CM | POA: Diagnosis present

## 2018-04-16 DIAGNOSIS — K7469 Other cirrhosis of liver: Secondary | ICD-10-CM | POA: Diagnosis present

## 2018-04-16 DIAGNOSIS — G894 Chronic pain syndrome: Secondary | ICD-10-CM | POA: Diagnosis present

## 2018-04-16 DIAGNOSIS — K7290 Hepatic failure, unspecified without coma: Secondary | ICD-10-CM | POA: Diagnosis present

## 2018-04-16 DIAGNOSIS — E871 Hypo-osmolality and hyponatremia: Secondary | ICD-10-CM | POA: Diagnosis present

## 2018-04-16 DIAGNOSIS — Z66 Do not resuscitate: Secondary | ICD-10-CM | POA: Diagnosis not present

## 2018-04-16 DIAGNOSIS — Z87891 Personal history of nicotine dependence: Secondary | ICD-10-CM

## 2018-04-16 DIAGNOSIS — E878 Other disorders of electrolyte and fluid balance, not elsewhere classified: Secondary | ICD-10-CM | POA: Diagnosis present

## 2018-04-16 DIAGNOSIS — I129 Hypertensive chronic kidney disease with stage 1 through stage 4 chronic kidney disease, or unspecified chronic kidney disease: Secondary | ICD-10-CM | POA: Diagnosis present

## 2018-04-16 DIAGNOSIS — J45909 Unspecified asthma, uncomplicated: Secondary | ICD-10-CM | POA: Diagnosis present

## 2018-04-16 DIAGNOSIS — N179 Acute kidney failure, unspecified: Secondary | ICD-10-CM

## 2018-04-16 DIAGNOSIS — K219 Gastro-esophageal reflux disease without esophagitis: Secondary | ICD-10-CM | POA: Diagnosis present

## 2018-04-16 DIAGNOSIS — R531 Weakness: Secondary | ICD-10-CM | POA: Diagnosis not present

## 2018-04-16 DIAGNOSIS — J948 Other specified pleural conditions: Secondary | ICD-10-CM | POA: Diagnosis present

## 2018-04-16 DIAGNOSIS — K7581 Nonalcoholic steatohepatitis (NASH): Secondary | ICD-10-CM | POA: Diagnosis not present

## 2018-04-16 DIAGNOSIS — E739 Lactose intolerance, unspecified: Secondary | ICD-10-CM | POA: Diagnosis present

## 2018-04-16 DIAGNOSIS — E875 Hyperkalemia: Principal | ICD-10-CM

## 2018-04-16 DIAGNOSIS — Z515 Encounter for palliative care: Secondary | ICD-10-CM | POA: Diagnosis not present

## 2018-04-16 LAB — COMPREHENSIVE METABOLIC PANEL
ALT: 37 U/L (ref 0–44)
AST: 60 U/L — ABNORMAL HIGH (ref 15–41)
Albumin: 3.9 g/dL (ref 3.5–5.0)
Alkaline Phosphatase: 137 U/L — ABNORMAL HIGH (ref 38–126)
Anion gap: 6 (ref 5–15)
BUN: 32 mg/dL — ABNORMAL HIGH (ref 8–23)
CO2: 23 mmol/L (ref 22–32)
Calcium: 9.7 mg/dL (ref 8.9–10.3)
Chloride: 91 mmol/L — ABNORMAL LOW (ref 98–111)
Creatinine, Ser: 2.15 mg/dL — ABNORMAL HIGH (ref 0.44–1.00)
GFR calc Af Amer: 28 mL/min — ABNORMAL LOW (ref >60)
GFR calc non Af Amer: 24 mL/min — ABNORMAL LOW (ref >60)
GLUCOSE: 84 mg/dL (ref 70–99)
Potassium: 7.4 mmol/L (ref 3.5–5.1)
Sodium: 120 mmol/L — ABNORMAL LOW (ref 135–145)
Total Bilirubin: 2.4 mg/dL — ABNORMAL HIGH (ref 0.3–1.2)
Total Protein: 7.3 g/dL (ref 6.5–8.1)

## 2018-04-16 LAB — CBC WITH DIFFERENTIAL/PLATELET
Abs Immature Granulocytes: 0.03 10*3/uL (ref 0.00–0.07)
BASOS ABS: 0.1 10*3/uL (ref 0.0–0.1)
Basophils Relative: 1 %
Eosinophils Absolute: 0.5 10*3/uL (ref 0.0–0.5)
Eosinophils Relative: 6 %
HCT: 30.9 % — ABNORMAL LOW (ref 36.0–46.0)
Hemoglobin: 10.9 g/dL — ABNORMAL LOW (ref 12.0–15.0)
Immature Granulocytes: 0 %
Lymphocytes Relative: 8 %
Lymphs Abs: 0.6 10*3/uL — ABNORMAL LOW (ref 0.7–4.0)
MCH: 32.4 pg (ref 26.0–34.0)
MCHC: 35.3 g/dL (ref 30.0–36.0)
MCV: 92 fL (ref 80.0–100.0)
MONO ABS: 1 10*3/uL (ref 0.1–1.0)
Monocytes Relative: 13 %
Neutro Abs: 5.4 10*3/uL (ref 1.7–7.7)
Neutrophils Relative %: 72 %
Platelets: 165 10*3/uL (ref 150–400)
RBC: 3.36 MIL/uL — AB (ref 3.87–5.11)
RDW: 14.8 % (ref 11.5–15.5)
WBC: 7.6 10*3/uL (ref 4.0–10.5)
nRBC: 0 % (ref 0.0–0.2)

## 2018-04-16 LAB — TYPE AND SCREEN
ABO/RH(D): O NEG
ANTIBODY SCREEN: NEGATIVE

## 2018-04-16 MED ORDER — SODIUM BICARBONATE 8.4 % IV SOLN
INTRAVENOUS | Status: AC
  Administered 2018-04-16: 50 meq via INTRAVENOUS
  Filled 2018-04-16: qty 50

## 2018-04-16 MED ORDER — ALBUTEROL SULFATE (2.5 MG/3ML) 0.083% IN NEBU: 2.5000 mg | INHALATION_SOLUTION | Freq: Four times a day (QID) | RESPIRATORY_TRACT | Status: DC | PRN

## 2018-04-16 MED ORDER — INSULIN ASPART 100 UNIT/ML ~~LOC~~ SOLN
6.0000 [IU] | Freq: Once | SUBCUTANEOUS | Status: AC
  Administered 2018-04-16: 6 [IU] via INTRAVENOUS
  Filled 2018-04-16: qty 1

## 2018-04-16 MED ORDER — BUSPIRONE HCL 10 MG PO TABS
10.0000 mg | ORAL_TABLET | Freq: Three times a day (TID) | ORAL | Status: DC
  Administered 2018-04-16 – 2018-04-18 (×5): 10 mg via ORAL
  Filled 2018-04-16 (×7): qty 1

## 2018-04-16 MED ORDER — POLYETHYLENE GLYCOL 3350 17 G PO PACK: 17.0000 g | PACK | Freq: Every day | ORAL | Status: DC | PRN

## 2018-04-16 MED ORDER — PATIROMER SORBITEX CALCIUM 8.4 G PO PACK
16.8000 g | PACK | Freq: Every day | ORAL | Status: AC
  Administered 2018-04-17 – 2018-04-18 (×2): 16.8 g via ORAL
  Filled 2018-04-16 (×2): qty 2

## 2018-04-16 MED ORDER — PATIROMER SORBITEX CALCIUM 8.4 G PO PACK
16.8000 g | PACK | Freq: Every day | ORAL | Status: DC
  Filled 2018-04-16: qty 2

## 2018-04-16 MED ORDER — PATIROMER SORBITEX CALCIUM 8.4 G PO PACK: 16.8000 g | PACK | Freq: Every day | ORAL | Status: DC

## 2018-04-16 MED ORDER — ACETAMINOPHEN 650 MG RE SUPP: 650.0000 mg | Freq: Four times a day (QID) | RECTAL | Status: DC | PRN

## 2018-04-16 MED ORDER — PATIROMER SORBITEX CALCIUM 8.4 G PO PACK
25.2000 g | PACK | Freq: Every day | ORAL | Status: DC
  Administered 2018-04-16: 25.2 g via ORAL
  Filled 2018-04-16: qty 3

## 2018-04-16 MED ORDER — ALBUTEROL SULFATE (2.5 MG/3ML) 0.083% IN NEBU
2.5000 mg | INHALATION_SOLUTION | Freq: Four times a day (QID) | RESPIRATORY_TRACT | Status: DC
  Administered 2018-04-17 – 2018-04-18 (×7): 2.5 mg via RESPIRATORY_TRACT
  Filled 2018-04-16 (×8): qty 3

## 2018-04-16 MED ORDER — TRAMADOL HCL 50 MG PO TABS
50.0000 mg | ORAL_TABLET | Freq: Four times a day (QID) | ORAL | Status: DC | PRN
  Administered 2018-04-17 (×2): 50 mg via ORAL
  Filled 2018-04-16 (×2): qty 1

## 2018-04-16 MED ORDER — SODIUM BICARBONATE 8.4 % IV SOLN
50.0000 meq | Freq: Once | INTRAVENOUS | Status: AC
  Administered 2018-04-16: 50 meq via INTRAVENOUS

## 2018-04-16 MED ORDER — HEPARIN SODIUM (PORCINE) 5000 UNIT/ML IJ SOLN
5000.0000 [IU] | Freq: Three times a day (TID) | INTRAMUSCULAR | Status: DC
  Administered 2018-04-16 – 2018-04-18 (×5): 5000 [IU] via SUBCUTANEOUS
  Filled 2018-04-16 (×5): qty 1

## 2018-04-16 MED ORDER — CITALOPRAM HYDROBROMIDE 20 MG PO TABS
40.0000 mg | ORAL_TABLET | Freq: Every day | ORAL | Status: DC
  Administered 2018-04-17 – 2018-04-18 (×2): 40 mg via ORAL
  Filled 2018-04-16 (×2): qty 2

## 2018-04-16 MED ORDER — CALCIUM GLUCONATE 10 % IV SOLN
1.0000 g | Freq: Once | INTRAVENOUS | Status: AC
  Administered 2018-04-16: 1 g via INTRAVENOUS
  Filled 2018-04-16: qty 10

## 2018-04-16 MED ORDER — ONDANSETRON HCL 4 MG PO TABS
4.0000 mg | ORAL_TABLET | Freq: Four times a day (QID) | ORAL | Status: DC | PRN
  Administered 2018-04-18: 4 mg via ORAL
  Filled 2018-04-16: qty 1

## 2018-04-16 MED ORDER — PRIMIDONE 50 MG PO TABS
50.0000 mg | ORAL_TABLET | Freq: Two times a day (BID) | ORAL | Status: DC
  Administered 2018-04-16 – 2018-04-18 (×4): 50 mg via ORAL
  Filled 2018-04-16 (×5): qty 1

## 2018-04-16 MED ORDER — SODIUM CHLORIDE 1 G PO TABS
2.0000 g | ORAL_TABLET | Freq: Three times a day (TID) | ORAL | Status: AC
  Administered 2018-04-16 – 2018-04-17 (×4): 2 g via ORAL
  Filled 2018-04-16 (×5): qty 2

## 2018-04-16 MED ORDER — SIMETHICONE 80 MG PO CHEW
80.0000 mg | CHEWABLE_TABLET | Freq: Four times a day (QID) | ORAL | Status: DC | PRN
  Administered 2018-04-17: 80 mg via ORAL
  Filled 2018-04-16 (×2): qty 1

## 2018-04-16 MED ORDER — DEXTROSE 50 % IV SOLN
1.0000 | Freq: Once | INTRAVENOUS | Status: AC
  Administered 2018-04-16: 50 mL via INTRAVENOUS
  Filled 2018-04-16: qty 50

## 2018-04-16 MED ORDER — ACETAMINOPHEN 325 MG PO TABS: 650.0000 mg | ORAL_TABLET | Freq: Four times a day (QID) | ORAL | Status: DC | PRN

## 2018-04-16 MED ORDER — SODIUM CHLORIDE 0.9 % IV BOLUS
500.0000 mL | Freq: Once | INTRAVENOUS | Status: AC
  Administered 2018-04-16: 500 mL via INTRAVENOUS

## 2018-04-16 MED ORDER — CALCIUM CARBONATE ANTACID 500 MG PO CHEW
2.0000 | CHEWABLE_TABLET | Freq: Two times a day (BID) | ORAL | Status: DC
  Administered 2018-04-16 – 2018-04-18 (×4): 400 mg via ORAL
  Filled 2018-04-16 (×4): qty 2

## 2018-04-16 MED ORDER — DOCUSATE SODIUM 100 MG PO CAPS
100.0000 mg | ORAL_CAPSULE | Freq: Two times a day (BID) | ORAL | Status: DC
  Administered 2018-04-18: 100 mg via ORAL
  Filled 2018-04-16 (×4): qty 1

## 2018-04-16 MED ORDER — GABAPENTIN 100 MG PO CAPS
200.0000 mg | ORAL_CAPSULE | Freq: Every day | ORAL | Status: DC
  Administered 2018-04-16 – 2018-04-17 (×2): 200 mg via ORAL
  Filled 2018-04-16 (×2): qty 2

## 2018-04-16 MED ORDER — FAMOTIDINE 20 MG PO TABS
40.0000 mg | ORAL_TABLET | Freq: Every day | ORAL | Status: DC
  Administered 2018-04-16: 40 mg via ORAL
  Filled 2018-04-16: qty 2

## 2018-04-16 MED ORDER — ALBUTEROL SULFATE (2.5 MG/3ML) 0.083% IN NEBU
2.5000 mg | INHALATION_SOLUTION | Freq: Once | RESPIRATORY_TRACT | Status: AC
  Administered 2018-04-16: 2.5 mg via RESPIRATORY_TRACT
  Filled 2018-04-16: qty 3

## 2018-04-16 MED ORDER — SODIUM CHLORIDE 0.9 % IV SOLN
INTRAVENOUS | Status: DC
  Administered 2018-04-16: 22:00:00 via INTRAVENOUS

## 2018-04-16 NOTE — ED Notes (Signed)
ED TO INPATIENT HANDOFF REPORT  ED Nurse Name and Phone #: Zayin Valadez, [phone]  S Name/Age/Gender Kelly Mclean 63 y.o. female Room/Bed: ED02A/ED02A  Code Status   Code Status: Prior  Home/SNF/Other home Patient oriented x 4 Is this baseline? yes  Triage Complete: Triage complete  Chief Complaint Fall  Triage Note Pt arrives via ems from home, with report of fall. Ems states pt was attempting to stand from bed when legs felt weak and she slid down to floor. Denies LOC, or pain. Pt a&o x 4 on arrival. NAD noted at this time. Fingers and feet numb since drain placed to drain fluid from left lung.    Allergies Allergies  Allergen Reactions  . Biaxin [Clarithromycin] Shortness Of Breath and Rash  . Penicillins Anaphylaxis and Other (See Comments)    Did it involve swelling of the face/tongue/throat, SOB, or low BP? Unknown Did it involve sudden or severe rash/hives, skin peeling, or any reaction on the inside of your mouth or nose? Unknown Did you need to seek medical attention at a hospital or doctor's office? Yes When did it last happen?childhood If all above answers are "NO", may proceed with cephalosporin use.    Marland Kitchen Zoloft [Sertraline Hcl] Other (See Comments)    Reaction: "made crazy"  . Milk-Related Compounds Diarrhea  . Lactose Intolerance (Gi)   . Other Other (See Comments)    Laughing gas pt turned gray,general anesthesia    Level of Care/Admitting Diagnosis ED Disposition    ED Disposition Condition [place]: [place] [[id number]]  Level of Care: Telemetry [5]  Diagnosis: Hyperkalemia [268341]  Admitting Physician: Gorden Harms [[id number]]  Attending Physician: Gorden Harms [[id number]]  Estimated length of stay: past midnight tomorrow  Certification:: I certify this patient will need inpatient services for at least 2 midnights  PT Class (Do Not Modify): Inpatient [101]  PT Acc Code (Do Not Modify): Private  [1]       B Medical/Surgery History Past Medical History:  Diagnosis Date  . Anemia   . Asthma   . Cirrhosis ([place])   . Hypertension   . Pleural effusion    Past Surgical History:  Procedure Laterality Date  . IR GUIDED DRAIN W CATHETER PLACEMENT  04/04/2018  . IR US GUIDE VASC ACCESS LEFT  04/04/2018  . TUBAL LIGATION       A IV Location/Drains/Wounds Patient Lines/Drains/Airways Status   Active Line/Drains/Airways    Name:   Placement date:   Placement time:   Site:   Days:   Peripheral IV 04/16/18 Right Antecubital   04/16/18    1745    Antecubital   less than 1   Closed System Drain 1 Left Flank Other (Comment) 15.5 Fr.   04/04/18    1224    Flank   12   Incision (Closed) 04/04/18 Flank Left   04/04/18    1224     12   Incision (Closed) 04/04/18 Flank Left   04/04/18    1225     12          Intake/Output Last 24 hours No intake or output data in the 24 hours ending 04/16/18 1959  Labs/Imaging Results for orders placed or performed during the hospital encounter of 04/16/18 (from the past 48 hour(s))  CBC with Differential/Platelet     Status: Abnormal   Collection Time: 04/16/18  5:39 PM  Result Value Ref Range   WBC 7.6 4.0 -  10.5 K/uL   RBC 3.36 (L) 3.87 - 5.11 MIL/uL   Hemoglobin 10.9 (L) 12.0 - 15.0 g/dL   HCT 30.9 (L) 36.0 - 46.0 %   MCV 92.0 80.0 - 100.0 fL   MCH 32.4 26.0 - 34.0 pg   MCHC 35.3 30.0 - 36.0 g/dL   RDW 14.8 11.5 - 15.5 %   Platelets 165 150 - 400 K/uL   nRBC 0.0 0.0 - 0.2 %   Neutrophils Relative % 72 %   Neutro Abs 5.4 1.7 - 7.7 K/uL   Lymphocytes Relative 8 %   Lymphs Abs 0.6 (L) 0.7 - 4.0 K/uL   Monocytes Relative 13 %   Monocytes Absolute 1.0 0.1 - 1.0 K/uL   Eosinophils Relative 6 %   Eosinophils Absolute 0.5 0.0 - 0.5 K/uL   Basophils Relative 1 %   Basophils Absolute 0.1 0.0 - 0.1 K/uL   Immature Granulocytes 0 %   Abs Immature Granulocytes 0.03 0.00 - 0.07 K/uL    Comment: Performed at [facility], [address] [place], [place] [address]  Type and screen [place]     Status: None   Collection Time: 04/16/18  5:39 PM  Result Value Ref Range   ABO/RH(D) O NEG    Antibody Screen NEG    Sample Expiration      04/19/2018 Performed at [facility], [address] [place], [place] [address]   Comprehensive metabolic panel     Status: Abnormal   Collection Time: 04/16/18  5:39 PM  Result Value Ref Range   Sodium 120 (L) 135 - 145 mmol/L   Potassium 7.4 (HH) 3.5 - 5.1 mmol/L    Comment: CRITICAL RESULT CALLED TO, READ BACK BY AND VERIFIED WITH Kerith Sherley 04/16/18 @ 1816  MLK    Chloride 91 (L) 98 - 111 mmol/L   CO2 23 22 - 32 mmol/L   Glucose, Bld 84 70 - 99 mg/dL   BUN 32 (H) 8 - 23 mg/dL   Creatinine, Ser 2.15 (H) 0.44 - 1.00 mg/dL   Calcium 9.7 8.9 - 10.3 mg/dL   Total Protein 7.3 6.5 - 8.1 g/dL   Albumin 3.9 3.5 - 5.0 g/dL   AST 60 (H) 15 - 41 U/L   ALT 37 0 - 44 U/L   Alkaline Phosphatase 137 (H) 38 - 126 U/L   Total Bilirubin 2.4 (H) 0.3 - 1.2 mg/dL   GFR calc non Af Amer 24 (L) >60 mL/min   GFR calc Af Amer 28 (L) >60 mL/min   Anion gap 6 5 - 15    Comment: Performed at [facility], [address] [place], [place] [address]   No results found.  Pending Labs [employer] (From admission, onward)    Start     Ordered   Signed and Held  CBC  (heparin)  Once,   R    Comments:  Baseline for heparin therapy IF NOT ALREADY DRAWN.  Notify MD if PLT < 100 K.    Signed and Held   Signed and Held  Creatinine, serum  (heparin)  Once,   R    Comments:  Baseline for heparin therapy IF NOT ALREADY DRAWN.    Signed and Held   Signed and Held  Basic metabolic panel  Tomorrow morning,   R     Signed and Held          Vitals/Pain Today's Vitals   04/16/18 1728 04/16/18  1730 04/16/18 1845 04/16/18 1930  BP: (!) 130/48 (!) 134/52 (!) 127/47 (!) 137/55  Pulse: 84 87    Resp: 17 17 (!) 23 19  Temp: 98.5 F (36.9 C)     TempSrc:  Oral     SpO2: 100% 100%    Weight:  65.8 kg    Height:  5' 2"  (1.575 m)    PainSc:  0-No pain      Isolation Precautions No active isolations  Medications Medications  patiromer (VELTASSA) packet 16.8 g (has no administration in time range)  sodium chloride tablet 2 g (has no administration in time range)  insulin aspart (novoLOG) injection 6 Units (6 Units Intravenous Given 04/16/18 1833)  dextrose 50 % solution 50 mL (50 mLs Intravenous Given 04/16/18 1831)  calcium gluconate inj 10% (1 g) URGENT USE ONLY! (1 g Intravenous Given 04/16/18 1825)  sodium bicarbonate injection 50 mEq (50 mEq Intravenous Given 04/16/18 1830)  sodium chloride 0.9 % bolus 500 mL (500 mLs Intravenous New Bag/Given 04/16/18 1837)  albuterol (PROVENTIL) (2.5 MG/3ML) 0.083% nebulizer solution 2.5 mg (2.5 mg Nebulization Given 04/16/18 1841)    Mobility Low fall risk   Focused Assessments   R Recommendations: See Admitting Provider Note  Pt asking about palliative/hospice care information, please follow up with pt regarding this matter

## 2018-04-16 NOTE — Progress Notes (Signed)
Family Meeting Note  Advance Directive:yes  Today a meeting took place with the Patient.  Patient is able to participate  The following clinical team members were present during this meeting:MD  The following were discussed:Patient's diagnosis: 63 y.o. female with [name] cirrhosis with chronic recurring hepatic hydrothorax status post right Pleurx catheter-drainage on Monday/Wednesday/, recent hospital transfer from our hospital earlier this month for possible TIPS procedure to [facility]-patient is not a candidate for procedure, subsequently discharged home with home hospice, Patient's progosis: Unable to determine and Goals for treatment: DNR  Additional follow-up to be provided: prn  Time spent during discussion:20 minutes  Gorden Harms, MD

## 2018-04-16 NOTE — ED Notes (Signed)
Lorrie RN, aware of bed assigned

## 2018-04-16 NOTE — ED Notes (Signed)
Date and time results received: 04/16/18  Test: potassium  Critical Value: 7.4  Name of Provider Notified: Quentin Cornwall MD  Orders Received? Or Actions Taken?: MD notified

## 2018-04-16 NOTE — ED Triage Notes (Signed)
Pt arrives via ems from home, with report of fall. Ems states pt was attempting to stand from bed when legs felt weak and she slid down to floor. Denies LOC, or pain. Pt a&o x 4 on arrival. NAD noted at this time. Fingers and feet numb since drain placed to drain fluid from left lung.

## 2018-04-16 NOTE — ED Provider Notes (Signed)
[facility] Emergency Department Provider Note    First MD Initiated Contact with Patient 04/16/18 1726     (approximate)  I have reviewed the triage vital signs and the nursing notes.   HISTORY  Chief Complaint Fatigue and Fall    HPI Kelly Mclean is a 63 y.o. female listed past medical history with chronic pleural catheter in place presents the ER after fall.  States that she was getting up out of her bed to go use the restroom started feeling weak and lightheaded after standing up.  She fell to the ground landing on her bottom but did not land hard.  Did not hit her head.  Denies any knee pain hip pain or back pain.  No head injury or neck pain.  Denies any worsening shortness of breath or chest pain.  Denies any fevers.  No worsening cough or shortness of breath.    Past Medical History:  Diagnosis Date  . Anemia   . Asthma   . Cirrhosis ([place])   . Hypertension   . Pleural effusion    Family History  Problem Relation Age of Onset  . Breast cancer Maternal Aunt 84   Past Surgical History:  Procedure Laterality Date  . IR GUIDED DRAIN W CATHETER PLACEMENT  04/04/2018  . IR US GUIDE VASC ACCESS LEFT  04/04/2018  . TUBAL LIGATION     Patient Active Problem List   Diagnosis Date Noted  . Pleural effusion associated with hepatic disorder   . Renal failure   . Chronic liver failure without hepatic coma ([place])   . Advanced care planning/counseling discussion   . Goals of care, counseling/discussion   . Palliative care by specialist   . Cirrhosis ([place]) 03/28/2018  . Acute kidney injury superimposed on chronic kidney disease ([place]) 03/28/2018  . Ascites   . Hydrothorax   . Lymphocytosis 03/23/2018  . Acute respiratory failure ([place]) 03/14/2018      Prior to Admission medications   Medication Sig Start Date End Date Taking? Authorizing Provider  albuterol (PROVENTIL) (2.5 MG/3ML) 0.083% nebulizer solution Inhale 3 mLs (2.5 mg total) into the  lungs every 6 (six) hours as needed for wheezing or shortness of breath. 03/27/18   Epifanio Lesches, MD  busPIRone (BUSPAR) 10 MG tablet Take 10 mg by mouth 3 (three) times daily.    [provider]  calcium carbonate (TUMS - DOSED IN MG ELEMENTAL CALCIUM) 500 MG chewable tablet Chew 2 tablets by mouth 2 (two) times daily.    [provider]  citalopram (CELEXA) 20 MG tablet Take 20 mg by mouth daily.    [provider]  dexamethasone (DECADRON) 0.5 MG tablet Take 1 tablet (0.5 mg total) by mouth 2 (two) times daily for 30 days. 04/08/18 05/08/18  Amin, Jeanella Flattery, MD  docusate sodium (COLACE) 100 MG capsule Take 100 mg by mouth 2 (two) times daily.    [provider]  famotidine (PEPCID) 40 MG tablet Take 40 mg by mouth at bedtime.    [provider]  furosemide (LASIX) 20 MG tablet Take 1 tablet (20 mg total) by mouth daily. 03/27/18   Epifanio Lesches, MD  gabapentin (NEURONTIN) 100 MG capsule Take 200 mg by mouth at bedtime.    [provider]  primidone (MYSOLINE) 50 MG tablet Take 50 mg by mouth 2 (two) times daily.    [provider]    Allergies Biaxin [clarithromycin]; Penicillins; Zoloft [sertraline hcl]; Milk-related compounds; Lactose intolerance (gi);  and Other    Social History Social History   Tobacco Use  . Smoking status: Former Smoker    Last attempt to quit: 1990    Years since quitting: 30.2  . Smokeless tobacco: Never Used  Substance Use Topics  . Alcohol use: Not Currently  . Drug use: Not Currently    Review of Systems Patient denies headaches, rhinorrhea, blurry vision, numbness, shortness of breath, chest pain, edema, cough, abdominal pain, nausea, vomiting, diarrhea, dysuria, fevers, rashes or hallucinations unless otherwise stated above in HPI. ____________________________________________   PHYSICAL EXAM:  VITAL SIGNS: Vitals:   04/16/18 1728 04/16/18 1730  BP: (!) 130/48 (!) 134/52   Pulse: 84 87  Resp: 17 17  Temp: 98.5 F (36.9 C)   SpO2: 100% 100%    Constitutional: Alert and oriented.  Eyes: Conjunctivae are normal.  Head: Atraumatic. Nose: No congestion/rhinnorhea. Mouth/Throat: Mucous membranes are moist.   Neck: No stridor. Painless ROM.  Cardiovascular: Normal rate, regular rhythm. Grossly normal heart sounds.  Good peripheral circulation. Respiratory: Normal respiratory effort.  No retractions. Lungs CTAB. Gastrointestinal: Soft and nontender. No distention. No abdominal bruits. No CVA tenderness. Genitourinary:  Musculoskeletal: No lower extremity tenderness nor edema.  No joint effusions. Neurologic:  Normal speech and language. No gross focal neurologic deficits are appreciated. No facial droop Skin:  Skin is warm, dry and intact. No rash noted. Psychiatric: Mood and affect are normal. Speech and behavior are normal.  ____________________________________________   LABS (all labs ordered are listed, but only abnormal results are displayed)  Results for orders placed or performed during the hospital encounter of 04/16/18 (from the past 24 hour(s))  CBC with Differential/Platelet     Status: Abnormal   Collection Time: 04/16/18  5:39 PM  Result Value Ref Range   WBC 7.6 4.0 - 10.5 K/uL   RBC 3.36 (L) 3.87 - 5.11 MIL/uL   Hemoglobin 10.9 (L) 12.0 - 15.0 g/dL   HCT 30.9 (L) 36.0 - 46.0 %   MCV 92.0 80.0 - 100.0 fL   MCH 32.4 26.0 - 34.0 pg   MCHC 35.3 30.0 - 36.0 g/dL   RDW 14.8 11.5 - 15.5 %   Platelets 165 150 - 400 K/uL   nRBC 0.0 0.0 - 0.2 %   Neutrophils Relative % 72 %   Neutro Abs 5.4 1.7 - 7.7 K/uL   Lymphocytes Relative 8 %   Lymphs Abs 0.6 (L) 0.7 - 4.0 K/uL   Monocytes Relative 13 %   Monocytes Absolute 1.0 0.1 - 1.0 K/uL   Eosinophils Relative 6 %   Eosinophils Absolute 0.5 0.0 - 0.5 K/uL   Basophils Relative 1 %   Basophils Absolute 0.1 0.0 - 0.1 K/uL   Immature Granulocytes 0 %   Abs Immature Granulocytes 0.03 0.00 - 0.07  K/uL  Type and screen [facility]     Status: None (Preliminary result)   Collection Time: 04/16/18  5:39 PM  Result Value Ref Range   ABO/RH(D) PENDING    Antibody Screen PENDING    Sample Expiration      04/19/2018 Performed at [facility], [address] [place], [place] [address]   Comprehensive metabolic panel     Status: Abnormal   Collection Time: 04/16/18  5:39 PM  Result Value Ref Range   Sodium 120 (L) 135 - 145 mmol/L   Potassium 7.4 (HH) 3.5 - 5.1 mmol/L   Chloride 91 (L) 98 - 111 mmol/L   CO2 23  22 - 32 mmol/L   Glucose, Bld 84 70 - 99 mg/dL   BUN 32 (H) 8 - 23 mg/dL   Creatinine, Ser 2.15 (H) 0.44 - 1.00 mg/dL   Calcium 9.7 8.9 - 10.3 mg/dL   Total Protein 7.3 6.5 - 8.1 g/dL   Albumin 3.9 3.5 - 5.0 g/dL   AST 60 (H) 15 - 41 U/L   ALT 37 0 - 44 U/L   Alkaline Phosphatase 137 (H) 38 - 126 U/L   Total Bilirubin 2.4 (H) 0.3 - 1.2 mg/dL   GFR calc non Af Amer 24 (L) >60 mL/min   GFR calc Af Amer 28 (L) >60 mL/min   Anion gap 6 5 - 15   ____________________________________________  EKG My review and personal interpretation at Time: 17:27   Indication: near syncope  Rate: 85  Rhythm: sinus Axis: normal Other: normal intervals, no stemi ____________________________________________  RADIOLOGY  I personally reviewed all radiographic images ordered to evaluate for the above acute complaints and reviewed radiology reports and findings.  These findings were personally discussed with the patient.  Please see medical record for radiology report.  ____________________________________________   PROCEDURES  Procedure(s) performed:  .Critical Care Performed by: Merlyn Lot, MD Authorized by: Merlyn Lot, MD   Critical care provider statement:    Critical care time (minutes):  30   Critical care time was exclusive of:  Separately billable procedures and treating other patients   Critical care was necessary to treat or  prevent imminent or life-threatening deterioration of the following conditions:  Metabolic crisis   Critical care was time spent personally by me on the following activities:  Development of treatment plan with patient or surrogate, discussions with consultants, evaluation of patient's response to treatment, examination of patient, obtaining history from patient or surrogate, ordering and performing treatments and interventions, ordering and review of laboratory studies, ordering and review of radiographic studies, pulse oximetry, re-evaluation of patient's condition and review of old charts      Critical Care performed: yes ____________________________________________   INITIAL IMPRESSION / ASSESSMENT AND PLAN / ED COURSE  Pertinent labs & imaging results that were available during my care of the patient were reviewed by me and considered in my medical decision making (see chart for details).   DDX: dehydration, anemia, electrolyte abn, fracture, contusion  Florean Hoobler is a 63 y.o. who presents to the ED with symptoms as described above.  Patient currently nontoxic.  She is afebrile hemodynamically stable.  No signs or symptoms of infectious process.  Will check screening labs given her history of anemia.  Her abdominal exam and physical exam is reassuring.  Clinical Course as of Apr 16 1830  Wed Apr 16, 2018  1737 Type and screen [facility] [PR]  [phone] Patient with hyperkalemia.  Does not have any evidence of EKG changes will temporize.  Will give IV fluid as I do believe she is little volume down probably secondary to third spacing given her cirrhosis and chronically draining proximal catheter.  Have given IV insulin, albuterol, Veltassa, sodium bicarb.  Consulted with Dr. Juleen China of nephrology.  Will discuss with hospitalist for admission for further hemodynamic monitoring.   [PR]    Clinical Course User Index [PR] Merlyn Lot, MD     As part of my  medical decision making, I reviewed the following data within the [place] notes reviewed and incorporated, Labs reviewed, notes from prior ED visits and [place] Controlled Substance Database  ____________________________________________   FINAL CLINICAL IMPRESSION(S) / ED DIAGNOSES  Final diagnoses:  Acute hyperkalemia      NEW MEDICATIONS STARTED DURING THIS VISIT:  New Prescriptions   No medications on file     Note:  This document was prepared using Dragon voice recognition software and may include unintentional dictation errors. r   Merlyn Lot, MD 04/16/18 [phone]

## 2018-04-16 NOTE — H&P (Signed)
[place] [facility] [place] NAME: Kelly Mclean    MR#:  [id number]  DATE OF BIRTH:  August 26, 1955  DATE OF ADMISSION:  04/16/2018  PRIMARY CARE PHYSICIAN: Marnee Guarneri, MD   REQUESTING/REFERRING PHYSICIAN:   CHIEF COMPLAINT:   Chief Complaint  Patient presents with  . Fatigue  . Fall    HISTORY OF PRESENT ILLNESS: Kelly Mclean  is a 64 y.o. female with a known history per below which includes [name] cirrhosis with chronic recurring hepatic hydrothorax status post right Pleurx catheter-drainage on Monday/Wednesday/, recent hospital transfer from our hospital earlier this month for possible TIPS procedure to [facility]-patient is not a candidate for procedure, subsequently discharged home with home hospice, returns as patient slid from her bed onto the floor, ER evaluation noted for potassium of 7.4, sodium 120, creatinine at baseline at 2.1, hemoglobin 2.9, hospitalist asked to admit, patient valuated emergency room, no apparent distress, resting comfortably in bed, denies any pain, patient wishes to be DNR going forward, she is not interested in hospice at this time, is interested in palliative care, patient is now been admitted for acute multiple electrolyte abnormalities which includes acute hyperkalemia, hyponatremia.  PAST MEDICAL HISTORY:   Past Medical History:  Diagnosis Date  . Anemia   . Asthma   . Cirrhosis ([place])   . Hypertension   . Pleural effusion     PAST SURGICAL HISTORY:  Past Surgical History:  Procedure Laterality Date  . IR GUIDED DRAIN W CATHETER PLACEMENT  04/04/2018  . IR US GUIDE VASC ACCESS LEFT  04/04/2018  . TUBAL LIGATION      SOCIAL HISTORY:  Social History   Tobacco Use  . Smoking status: Former Smoker    Last attempt to quit: 1990    Years since quitting: 30.2  . Smokeless tobacco: Never Used  Substance Use Topics  . Alcohol use: Not Currently    FAMILY HISTORY:  Family History  Problem Relation  Age of Onset  . Breast cancer Maternal Aunt 84    DRUG ALLERGIES:  Allergies  Allergen Reactions  . Biaxin [Clarithromycin] Shortness Of Breath and Rash  . Penicillins Anaphylaxis and Other (See Comments)    Did it involve swelling of the face/tongue/throat, SOB, or low BP? Unknown Did it involve sudden or severe rash/hives, skin peeling, or any reaction on the inside of your mouth or nose? Unknown Did you need to seek medical attention at a hospital or doctor's office? Yes When did it last happen?childhood If all above answers are "NO", may proceed with cephalosporin use.    Marland Kitchen Zoloft [Sertraline Hcl] Other (See Comments)    Reaction: "made crazy"  . Milk-Related Compounds Diarrhea  . Lactose Intolerance (Gi)   . Other Other (See Comments)    Laughing gas pt turned gray,general anesthesia    REVIEW OF SYSTEMS:   CONSTITUTIONAL: No fever, +fatigue, weakness, fall.  EYES: No blurred or double vision.  EARS, NOSE, AND THROAT: No tinnitus or ear pain.  RESPIRATORY: No cough, shortness of breath, wheezing or hemoptysis.  CARDIOVASCULAR: No chest pain, orthopnea, edema.  GASTROINTESTINAL: No nausea, vomiting, diarrhea or abdominal pain.  GENITOURINARY: No dysuria, hematuria.  ENDOCRINE: No polyuria, nocturia,  HEMATOLOGY: No anemia, easy bruising or bleeding SKIN: No rash or lesion. MUSCULOSKELETAL: No joint pain or arthritis.   NEUROLOGIC: No tingling, numbness, weakness.  PSYCHIATRY: No anxiety or depression.   MEDICATIONS AT HOME:  Prior to Admission medications   Medication Sig  Start Date End Date Taking? Authorizing Provider  albuterol (PROVENTIL) (2.5 MG/3ML) 0.083% nebulizer solution Inhale 3 mLs (2.5 mg total) into the lungs every 6 (six) hours as needed for wheezing or shortness of breath. 03/27/18  Yes Epifanio Lesches, MD  busPIRone (BUSPAR) 10 MG tablet Take 10 mg by mouth 3 (three) times daily.   Yes [provider]  calcium carbonate (TUMS - DOSED IN  MG ELEMENTAL CALCIUM) 500 MG chewable tablet Chew 2 tablets by mouth 2 (two) times daily.   Yes [provider]  citalopram (CELEXA) 40 MG tablet Take 40 mg by mouth daily.    Yes [provider]  docusate sodium (COLACE) 100 MG capsule Take 100 mg by mouth 2 (two) times daily.   Yes [provider]  famotidine (PEPCID) 40 MG tablet Take 40 mg by mouth at bedtime.   Yes [provider]  gabapentin (NEURONTIN) 100 MG capsule Take 200 mg by mouth at bedtime.   Yes [provider]  ondansetron (ZOFRAN) 4 MG tablet Take 4 mg by mouth every 6 (six) hours as needed for nausea or vomiting.   Yes [provider]  primidone (MYSOLINE) 50 MG tablet Take 50 mg by mouth 2 (two) times daily.   Yes [provider]  traMADol (ULTRAM) 50 MG tablet Take 50 mg by mouth every 6 (six) hours as needed.   Yes [provider]      PHYSICAL EXAMINATION:   VITAL SIGNS: Blood pressure (!) 127/47, pulse 87, temperature 98.5 F (36.9 C), temperature source Oral, resp. rate (!) 23, height 5' 2"  (1.575 m), weight 65.8 kg, SpO2 100 %.  GENERAL:  63 y.o.-year-old patient lying in the bed with no acute distress.  Obesity, nontoxic-appearing EYES: Pupils equal, round, reactive to light and accommodation. No scleral icterus. Extraocular muscles intact.  HEENT: Head atraumatic, normocephalic. Oropharynx and nasopharynx clear.  NECK:  Supple, no jugular venous distention. No thyroid enlargement, no tenderness.  LUNGS: Normal breath sounds bilaterally, no wheezing, rales,rhonchi or crepitation. No use of accessory muscles of respiration.  Right Pleurx catheter CARDIOVASCULAR: S1, S2 normal. No murmurs, rubs, or gallops.  ABDOMEN: Soft, nontender, nondistended. Bowel sounds present. No organomegaly or mass.  EXTREMITIES: No pedal edema, cyanosis, or clubbing.  Diffuse muscular atrophy NEUROLOGIC: Cranial nerves II through XII are intact. MAES. Gait not  checked.  PSYCHIATRIC: The patient is alert and oriented x 3.  SKIN: No obvious rash, lesion, or ulcer.   LABORATORY PANEL:   CBC Recent Labs  Lab 04/16/18 1739  WBC 7.6  HGB 10.9*  HCT 30.9*  PLT 165  MCV 92.0  MCH 32.4  MCHC 35.3  RDW 14.8  LYMPHSABS 0.6*  MONOABS 1.0  EOSABS 0.5  BASOSABS 0.1   ------------------------------------------------------------------------------------------------------------------  Chemistries  Recent Labs  Lab 04/16/18 1739  NA 120*  K 7.4*  CL 91*  CO2 23  GLUCOSE 84  BUN 32*  CREATININE 2.15*  CALCIUM 9.7  AST 60*  ALT 37  ALKPHOS 137*  BILITOT 2.4*   ------------------------------------------------------------------------------------------------------------------ estimated creatinine clearance is 24.5 mL/min (A) (by C-G formula based on SCr of 2.15 mg/dL (H)). ------------------------------------------------------------------------------------------------------------------ No results for input(s): TSH, T4TOTAL, T3FREE, THYROIDAB in the last 72 hours.  Invalid input(s): FREET3   Coagulation profile No results for input(s): INR, PROTIME in the last 168 hours. ------------------------------------------------------------------------------------------------------------------- No results for input(s): DDIMER in the last 72 hours. -------------------------------------------------------------------------------------------------------------------  Cardiac Enzymes No results for input(s): CKMB, TROPONINI, MYOGLOBIN in the last 168 hours.  Invalid input(s): CK ------------------------------------------------------------------------------------------------------------------ Invalid input(s): POCBNP  ---------------------------------------------------------------------------------------------------------------  Urinalysis    Component Value Date/Time   COLORURINE YELLOW 03/29/2018 2058   APPEARANCEUR CLEAR 03/29/2018 2058    LABSPEC 1.012 03/29/2018 2058   PHURINE 5.0 03/29/2018 2058   GLUCOSEU NEGATIVE 03/29/2018 2058   HGBUR LARGE (A) 03/29/2018 2058   BILIRUBINUR NEGATIVE 03/29/2018 2058   [place] NEGATIVE 03/29/2018 2058   PROTEINUR NEGATIVE 03/29/2018 2058   NITRITE NEGATIVE 03/29/2018 2058   LEUKOCYTESUR TRACE (A) 03/29/2018 2058     RADIOLOGY: No results found.  EKG: Orders placed or performed during the hospital encounter of 04/16/18  . EKG 12-Lead  . 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 Admit to telemetry bed, received calcium gluconate/dextrose/IV insulin/Veltassa/sodium bicarb, IV fluids for rehydration, salt tablets, BMP in the morning  *Chronic [place] cirrhosis with chronic recurring hepatic hydrothorax None candidate for TIPS procedure, hospice appropriate per [facility] evaluation earlier this month, patient is not ready for hospice at this time, patient is interested in palliative care Pleurx catheter drainage Mondays/Wednesdays/Fridays 800 cc, has been drained today Palliative care consulted  *Chronic hypoxic respiratory failure Stable Continue home requirement of 2 L via nasal cannula continuous  *Chronic asthma without exacerbation Breathing treatments PRN  *Chronic GERD PPI daily  *Chronic pain syndrome Stable Continue home regiment   All the records are reviewed and case discussed with ED provider. Management plans discussed with the patient, family and they are in agreement.  CODE STATUS:DNR Code Status History    Date Active Date Inactive Code Status Order ID Comments User Context   03/28/2018 2109 04/09/2018 1526 DNR [id number]  Etta Quill, DO Inpatient   03/15/2018 2251 03/28/2018 1853 DNR [id number]  Lance Coon, MD Inpatient   03/14/2018 1336 03/15/2018 2251 Full Code [id number]  Bettey Costa, MD ED    Questions for Most  Recent Historical Code Status (Order [id number])    Question Answer Comment   In the event of cardiac or respiratory ARREST Do not call a "code blue"    In the event of cardiac or respiratory ARREST Do not perform Intubation, CPR, defibrillation or ACLS    In the event of cardiac or respiratory ARREST Use medication by any route, position, wound care, and other measures to relive pain and suffering. May use oxygen, suction and manual treatment of airway obstruction as needed for comfort.         Advance Directive Documentation     Most Recent Value  Type of Advance Directive  Living will  Pre-existing out of facility DNR order (yellow form or pink MOST form)  -  "MOST" Form in Place?  -       TOTAL TIME TAKING CARE OF THIS PATIENT: 40 minutes.    Avel Peace Marley Pakula M.D on 04/16/2018   Between 7am to 6pm - Pager - [phone]  After 6pm go to [email or web address] - password [email or web address] [facility]  Office  [phone]  CC: Primary care physician; Marnee Guarneri, MD   Note: This dictation was prepared with Dragon dictation along with smaller phrase technology. Any transcriptional errors that result from this process are unintentional.

## 2018-04-17 ENCOUNTER — Inpatient Hospital Stay: Payer: Medicare (Managed Care)

## 2018-04-17 DIAGNOSIS — E875 Hyperkalemia: Principal | ICD-10-CM

## 2018-04-17 DIAGNOSIS — K7581 Nonalcoholic steatohepatitis (NASH): Secondary | ICD-10-CM

## 2018-04-17 DIAGNOSIS — Z515 Encounter for palliative care: Secondary | ICD-10-CM

## 2018-04-17 DIAGNOSIS — Z66 Do not resuscitate: Secondary | ICD-10-CM

## 2018-04-17 DIAGNOSIS — R531 Weakness: Secondary | ICD-10-CM

## 2018-04-17 LAB — URINALYSIS, ROUTINE W REFLEX MICROSCOPIC
Bilirubin Urine: NEGATIVE
Glucose, UA: NEGATIVE mg/dL
Ketones, ur: NEGATIVE mg/dL
Leukocytes,Ua: NEGATIVE
Nitrite: NEGATIVE
Protein, ur: NEGATIVE mg/dL
SPECIFIC GRAVITY, URINE: 1.01 (ref 1.005–1.030)
pH: 6 (ref 5.0–8.0)

## 2018-04-17 LAB — BASIC METABOLIC PANEL
ANION GAP: 5 (ref 5–15)
Anion gap: 8 (ref 5–15)
BUN: 28 mg/dL — ABNORMAL HIGH (ref 8–23)
BUN: 27 mg/dL — ABNORMAL HIGH (ref 8–23)
CALCIUM: 9.8 mg/dL (ref 8.9–10.3)
CALCIUM: 10 mg/dL (ref 8.9–10.3)
CO2: 22 mmol/L (ref 22–32)
CO2: 20 mmol/L — ABNORMAL LOW (ref 22–32)
Chloride: 98 mmol/L (ref 98–111)
Chloride: 101 mmol/L (ref 98–111)
Creatinine, Ser: 2.1 mg/dL — ABNORMAL HIGH (ref 0.44–1.00)
Creatinine, Ser: 2.19 mg/dL — ABNORMAL HIGH (ref 0.44–1.00)
GFR calc Af Amer: 29 mL/min — ABNORMAL LOW (ref >60)
GFR calc Af Amer: 27 mL/min — ABNORMAL LOW (ref >60)
GFR calc non Af Amer: 25 mL/min — ABNORMAL LOW (ref >60)
GFR calc non Af Amer: 24 mL/min — ABNORMAL LOW (ref >60)
Glucose, Bld: 86 mg/dL (ref 70–99)
Glucose, Bld: 112 mg/dL — ABNORMAL HIGH (ref 70–99)
Potassium: 6.2 mmol/L — ABNORMAL HIGH (ref 3.5–5.1)
Potassium: 5.3 mmol/L — ABNORMAL HIGH (ref 3.5–5.1)
Sodium: 125 mmol/L — ABNORMAL LOW (ref 135–145)
Sodium: 129 mmol/L — ABNORMAL LOW (ref 135–145)

## 2018-04-17 MED ORDER — FAMOTIDINE 20 MG PO TABS
20.0000 mg | ORAL_TABLET | Freq: Every day | ORAL | Status: DC
  Administered 2018-04-17: 20 mg via ORAL
  Filled 2018-04-17: qty 1

## 2018-04-17 MED ORDER — DEXTROSE 50 % IV SOLN
25.0000 mL | Freq: Once | INTRAVENOUS | Status: AC
  Administered 2018-04-17: 25 mL via INTRAVENOUS
  Filled 2018-04-17: qty 50

## 2018-04-17 MED ORDER — INSULIN REGULAR HUMAN 100 UNIT/ML IJ SOLN
5.0000 [IU] | Freq: Once | INTRAMUSCULAR | Status: AC
  Administered 2018-04-17: 5 [IU] via INTRAVENOUS
  Filled 2018-04-17: qty 10

## 2018-04-17 MED ORDER — ALBUTEROL SULFATE (2.5 MG/3ML) 0.083% IN NEBU
2.5000 mg | INHALATION_SOLUTION | RESPIRATORY_TRACT | Status: AC
  Administered 2018-04-17 (×2): 2.5 mg via RESPIRATORY_TRACT
  Filled 2018-04-17 (×2): qty 3

## 2018-04-17 MED ORDER — SODIUM POLYSTYRENE SULFONATE 15 GM/60ML PO SUSP
30.0000 g | Freq: Once | ORAL | Status: AC
  Administered 2018-04-17: 30 g via ORAL
  Filled 2018-04-17: qty 120

## 2018-04-17 MED ORDER — ORAL CARE MOUTH RINSE
15.0000 mL | Freq: Two times a day (BID) | OROMUCOSAL | Status: DC
  Administered 2018-04-17 – 2018-04-18 (×2): 15 mL via OROMUCOSAL

## 2018-04-17 NOTE — Progress Notes (Signed)
[place] [facility] [place] NAME: Kelly Mclean    MR#:  [id number]  DATE OF BIRTH:  [birth year]  SUBJECTIVE:  CHIEF COMPLAINT:   Chief Complaint  Patient presents with  . Fatigue  . Fall   Patient reported having nausea and vomiting this morning.  No fevers.  Hyperkalemia being treated  REVIEW OF SYSTEMS:  Review of Systems  Constitutional: Negative for chills and fever.  HENT: Negative for hearing loss and tinnitus.   Eyes: Negative for blurred vision and double vision.  Respiratory: Negative for cough and hemoptysis.   Cardiovascular: Negative for chest pain and palpitations.  Gastrointestinal: Positive for nausea and vomiting. Negative for abdominal pain.  Genitourinary: Negative for dysuria and urgency.  Musculoskeletal: Negative for myalgias and neck pain.  Skin: Negative for itching and rash.  Neurological: Negative for dizziness and headaches.  Psychiatric/Behavioral: Negative for depression and substance abuse.    DRUG ALLERGIES:   Allergies  Allergen Reactions  . Biaxin [Clarithromycin] Shortness Of Breath and Rash  . Penicillins Anaphylaxis and Other (See Comments)    Did it involve swelling of the face/tongue/throat, SOB, or low BP? Unknown Did it involve sudden or severe rash/hives, skin peeling, or any reaction on the inside of your mouth or nose? Unknown Did you need to seek medical attention at a hospital or doctor's office? Yes When did it last happen?childhood If all above answers are "NO", may proceed with cephalosporin use.    Marland Kitchen Zoloft [Sertraline Hcl] Other (See Comments)    Reaction: "made crazy"  . Milk-Related Compounds Diarrhea  . Lactose Intolerance (Gi)   . Other Other (See Comments)    Laughing gas pt turned gray,general anesthesia   VITALS:  Blood pressure (!) 133/50, pulse (!) 112, temperature 98.6 F (37 C), temperature source Oral, resp. rate 20, height 5' 2"  (1.575 m), weight 69.1 kg, SpO2  100 %. PHYSICAL EXAMINATION:  Physical Exam  Constitutional: She is oriented to person, place, and time. She appears well-developed and well-nourished.  HENT:  Head: Normocephalic.  Right Ear: External ear normal.  Eyes: Pupils are equal, round, and reactive to light. Conjunctivae are normal. Right eye exhibits no discharge.  Neck: Normal range of motion. Neck supple. No thyromegaly present.  Cardiovascular: Normal rate and normal heart sounds.  Respiratory: Effort normal and breath sounds normal. No respiratory distress.  GI: Soft. Bowel sounds are normal. There is no abdominal tenderness.  Musculoskeletal: Normal range of motion.        General: No edema.  Neurological: She is alert and oriented to person, place, and time. No cranial nerve deficit.  Skin: Skin is warm. She is not diaphoretic. No erythema.  Psychiatric: She has a normal mood and affect. Her behavior is normal.   LABORATORY PANEL:  Female CBC Recent Labs  Lab 04/16/18 1739  WBC 7.6  HGB 10.9*  HCT 30.9*  PLT 165   ------------------------------------------------------------------------------------------------------------------ Chemistries  Recent Labs  Lab 04/16/18 1739  04/17/18 1215  NA 120*   < > 129*  K 7.4*   < > 5.3*  CL 91*   < > 101  CO2 23   < > 20*  GLUCOSE 84   < > 112*  BUN 32*   < > 27*  CREATININE 2.15*   < > 2.19*  CALCIUM 9.7   < > 10.0  AST 60*  --   --   ALT 37  --   --   ALKPHOS  137*  --   --   BILITOT 2.4*  --   --    < > = values in this interval not displayed.   RADIOLOGY:  US Renal  Result Date: 04/17/2018 CLINICAL DATA:  Acute kidney failure, stage IV chronic kidney disease EXAM: RENAL / URINARY TRACT ULTRASOUND COMPLETE COMPARISON:  03/30/2018 FINDINGS: Right Kidney: Renal measurements: 9.1 x 4.4 x 5.0 cm = volume: 104 mL. Normal cortical thickness. Increased cortical echogenicity. No mass, hydronephrosis or shadowing calcification. Left Kidney: Renal measurements: 8.1 x 4.7  x 3.8 cm = volume: 74 mL. Mild cortical thinning. Increased cortical echogenicity. No mass, hydronephrosis or shadowing calcification. Bladder: Appears normal for degree of bladder distention. Scattered ascites. Nodular liver consistent with cirrhosis. IMPRESSION: Medical renal disease changes of both kidneys without mass or hydronephrosis. Cirrhotic liver with ascites. Electronically Signed   By: Lavonia Dana M.D.   On: 04/17/2018 10:32   ASSESSMENT AND PLAN:   1 Acute multiple electrolyte abnormalities, acute severe hyponatremia, hyperkalemia, hypochloremia Most likely secondary to end-stage liver disease Hyponatremia improved with sodium level up to 129 already.  Discontinued normal saline. Hyperkalemia treated with potassium level down from 7.4-5.3. Continue [place].  Follow-up on BMP in a.m.  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 Patient seen by palliative care team.  Patient wishes to continue with palliative services with the [facility] program on discharge from the hospital.  3.Chronic hypoxic respiratory failure Stable Continue home requirement of 2 L via nasal cannula continuous  4.  Chronic asthma without exacerbation Breathing treatments PRN  5. Chronic GERD PPI daily  6.Chronic pain syndrome Stable Continue home regiment  DVT prophylaxis; heparin   All the records are reviewed and case discussed with Care Management/Social Worker. Management plans discussed with the patient, family and they are in agreement.  CODE STATUS: DNR  TOTAL TIME TAKING CARE OF THIS PATIENT: 35 minutes.   More than 50% of the time was spent in counseling/coordination of care: YES  POSSIBLE D/C IN 1-2 DAYS, DEPENDING ON CLINICAL CONDITION.   Kelly Mclean M.D on 04/17/2018 at 3:28 PM  Between 7am to 6pm - Pager -  [phone]  After 6pm go to [email or web address] - Proofreader  [facility]  Office  [phone]  CC: Primary care physician; Kelly Guarneri, MD  Note: This dictation was prepared with Dragon dictation along with smaller phrase technology. Any transcriptional errors that result from this process are unintentional.

## 2018-04-17 NOTE — Progress Notes (Signed)
Ch visited w/ pt regarding prayer request and major life changes as shown in an OR. Pt shared that she grew weak and that was what lead to her fall at home but she was glad that she found out her potassium and sodium levels were not normal so she could get the treatment she needed. The pt shared that due to her limitations w/ transportation and needed to be on O2 all day, she wanted to know how she could get more care at home. The ch shared w/ the pt the option of hospice at home but that would mean comfort care and not treatment. Pt shard that she was frustrated w/ how limited the care she is receiving now and shares that she does not want her husband to worry while he is away at work. Ch shared w/ pt that she would f/u w/ the palliative care team members to see what would be best for her. Pt seemed to have a mild affect yet was cordial and willing to share about her challenges that she is having related to family dynamics. At this time, the husband is the main support since the other family members are estranged currently. Pt appears to be improving yet just needs to hv an understanding of her care plan when d/c. Ch shared that she would f/u w/ the pt before she gets d/c.

## 2018-04-17 NOTE — Consult Note (Signed)
Consultation Note Date: 04/17/2018   Patient Name: Kelly Mclean  DOB: January 04, 1956  MRN: [id number]  Age / Sex: 63 y.o., female  PCP: Marnee Guarneri, MD Referring Physician: Otila Back, MD  Reason for Consultation: Establishing goals of care and Psychosocial/spiritual support  HPI/Patient Profile: 63 y.o. female  admitted on 04/16/2018 with significant past medical  history for Kelly Mclean cirrhosis with chronic recurring hepatic hydrothorax status post right Pleurx catheter-drainage, recent hospital transfer from our hospital earlier this month for possible TIPS procedure to [facility]/ she is not a candidate for procedure, subsequently discharged home under [facility] program.  According to pace RN the patient slid from her bed and triggered her home alert alarm and patient was transferred to the ER via EMS.  In the ER  potassium of 7.4, sodium 120, creatinine at baseline at 2.1, hemoglobin 2.9,.  She was admitted for treatment and stabilization.  I spoke to pace program providers/Dr. Lutricia Feil, Trudy/licensed clinical social worker and Nurse, adult.  Goals of care are unclear.    Patient and her husband face treatment option decisions, advanced directive decisions and anticipatory care needs.    Clinical Assessment and Goals of Care:   This NP Wadie Lessen reviewed medical records, received report from team, assessed the patient and then meet at the patient's bedside  to discuss diagnosis, prognosis, GOC, EOL wishes disposition and options.  Patient is lethargic.  I placed phone call to her husband and had a detailed conversation regarding the above.  Introduced myself and the role of palliative medicine in the inpatient hospital setting.  We discussed the [facility] program, as they are the main providers and directors of this patient's care.   A discussion was had today regarding advanced directives.  Concepts  specific to code status, artifical feeding and hydration, continued IV antibiotics and rehospitalization was had.     We discussed the MOST form  The difference between a aggressive medical intervention path  and a palliative comfort care path for this patient at this time was had.  Values and goals of care important to patient and family were attempted to be elicited.  Patient's husband verbalizes an understanding of the seriousness of the current medical situation and the high risk for decompensation.  However at this time he is open to treat the treatable stabilizing the patient and getting her home, continuing under the pace program.  Strongly encouraged the patient's husband and [facility] to have a very concrete conversation utilizing the MOST form once patient is home, in order to clarify goals of care into the future.  Questions and concerns addressed.  Husband  encouraged to call with questions or concerns.    PMT will continue to support holistically.     SUMMARY OF RECOMMENDATIONS    Code Status/Advance Care Planning:  DNR   Palliative Prophylaxis:   Aspiration, Bowel Regimen, Delirium Protocol, Frequent Pain Assessment and Oral Care  Additional Recommendations (Limitations, Scope, Preferences):  Avoid Hospitalization  Psycho-social/Spiritual:   Desire for further Chaplaincy support:yes  Additional Recommendations:  Education on Hospice  Prognosis:   < 6 months  Discharge Planning: Has been struggles with discharge plan.  He and his wife moved here only 1 year ago from [place] and they have very limited social support system.  He recognizes that it is not optimal for his wife to be home alone when he is working but they "have no other choice".  He will not consider placing her in long-term care at this time of COVID-19.   Home under the direction of [facility] program     Primary Diagnoses: Present on Admission: . Hyperkalemia   I have reviewed the medical  record, interviewed the patient and family, and examined the patient. The following aspects are pertinent.  Past Medical History:  Diagnosis Date  . Anemia   . Asthma   . Cirrhosis ([place])   . Hypertension   . Pleural effusion    Social History   Socioeconomic History  . Marital status: Married    Spouse name: Not on file  . Number of children: Not on file  . Years of education: Not on file  . Highest education level: Not on file  Occupational History  . Not on file  Social Needs  . Financial resource strain: Not on file  . Food insecurity:    Worry: Not on file    Inability: Not on file  . Transportation needs:    Medical: Not on file    Non-medical: Not on file  Tobacco Use  . Smoking status: Former Smoker    Last attempt to quit: 1990    Years since quitting: 30.2  . Smokeless tobacco: Never Used  Substance and Sexual Activity  . Alcohol use: Not Currently  . Drug use: Not Currently  . Sexual activity: Not Currently  Lifestyle  . Physical activity:    Days per week: Not on file    Minutes per session: Not on file  . Stress: Not on file  Relationships  . Social connections:    Talks on phone: Not on file    Gets together: Not on file    Attends religious service: Not on file    Active member of club or organization: Not on file    Attends meetings of clubs or organizations: Not on file    Relationship status: Not on file  Other Topics Concern  . Not on file  Social History Narrative   Patient moved from [place].  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  Lives with her husband in [place].  No alcohol abuse or smoking.   Family History  Problem Relation Age of Onset  . Breast cancer Maternal Aunt 84   Scheduled Meds: . albuterol  2.5 mg Nebulization Q6H  . busPIRone  10 mg Oral TID  . calcium carbonate  2 tablet Oral BID  . citalopram  40 mg Oral Daily  . docusate sodium  100 mg Oral BID  . famotidine  20 mg Oral QHS  . gabapentin  200 mg Oral QHS  .  heparin  5,000 Units Subcutaneous Q8H  . mouth rinse  15 mL Mouth Rinse BID  . patiromer  16.8 g Oral Daily  . primidone  50 mg Oral BID  . sodium chloride  2 g Oral TID WC   Continuous Infusions: . sodium chloride 100 mL/hr at 04/17/18 0300   PRN Meds:.acetaminophen **OR** acetaminophen, albuterol, ondansetron, polyethylene glycol, simethicone, traMADol Medications Prior to Admission:  Prior to Admission medications   Medication Sig  Start Date End Date Taking? Authorizing Provider  albuterol (PROVENTIL) (2.5 MG/3ML) 0.083% nebulizer solution Inhale 3 mLs (2.5 mg total) into the lungs every 6 (six) hours as needed for wheezing or shortness of breath. 03/27/18  Yes Epifanio Lesches, MD  busPIRone (BUSPAR) 10 MG tablet Take 10 mg by mouth 3 (three) times daily.   Yes [provider]  calcium carbonate (TUMS - DOSED IN MG ELEMENTAL CALCIUM) 500 MG chewable tablet Chew 2 tablets by mouth 2 (two) times daily.   Yes [provider]  citalopram (CELEXA) 40 MG tablet Take 40 mg by mouth daily.    Yes [provider]  docusate sodium (COLACE) 100 MG capsule Take 100 mg by mouth 2 (two) times daily.   Yes [provider]  famotidine (PEPCID) 40 MG tablet Take 40 mg by mouth at bedtime.   Yes [provider]  gabapentin (NEURONTIN) 100 MG capsule Take 200 mg by mouth at bedtime.   Yes [provider]  ondansetron (ZOFRAN) 4 MG tablet Take 4 mg by mouth every 6 (six) hours as needed for nausea or vomiting.   Yes [provider]  primidone (MYSOLINE) 50 MG tablet Take 50 mg by mouth 2 (two) times daily.   Yes [provider]  traMADol (ULTRAM) 50 MG tablet Take 50 mg by mouth every 6 (six) hours as needed.   Yes [provider]   Allergies  Allergen Reactions  . Biaxin [Clarithromycin] Shortness Of Breath and Rash  . Penicillins Anaphylaxis and Other (See Comments)    Did it involve swelling of the face/tongue/throat,  SOB, or low BP? Unknown Did it involve sudden or severe rash/hives, skin peeling, or any reaction on the inside of your mouth or nose? Unknown Did you need to seek medical attention at a hospital or doctor's office? Yes When did it last happen?childhood If all above answers are "NO", may proceed with cephalosporin use.    Marland Kitchen Zoloft [Sertraline Hcl] Other (See Comments)    Reaction: "made crazy"  . Milk-Related Compounds Diarrhea  . Lactose Intolerance (Gi)   . Other Other (See Comments)    Laughing gas pt turned gray,general anesthesia   Review of Systems  Unable to perform ROS: Acuity of condition  Neurological: Positive for weakness.    Physical Exam  Vital Signs: BP (!) 133/50 (BP Location: Left Arm)   Pulse (!) 120   Temp 98.6 F (37 C) (Oral)   Resp 20   Ht 5' 2"  (1.575 m)   Wt 69.1 kg   SpO2 100%   BMI 27.87 kg/m  Pain Scale: Faces   Pain Score: 0-No pain   SpO2: SpO2: 100 % O2 Device:SpO2: 100 % O2 Flow Rate: .O2 Flow Rate (L/min): 2 L/min  IO: Intake/output summary:   Intake/Output Summary (Last 24 hours) at 04/17/2018 2119 Last data filed at 04/17/2018 0700 Gross per 24 hour  Intake 967.15 ml  Output 300 ml  Net 667.15 ml    LBM: Last BM Date: 04/16/18 Baseline Weight: Weight: 65.8 kg Most recent weight: Weight: 69.1 kg     Palliative Assessment/Data: 30% at best   Discussed with Dr Stark Jock  Time In: 1115 Time Out: 1245 Time Total: 90  minutes Greater than 50%  of this time was spent counseling and coordinating care related to the above assessment and plan.  Signed by: Wadie Lessen, NP   Please contact Palliative Medicine Team phone at [phone] for questions and concerns.  For individual provider:  See Amion

## 2018-04-17 NOTE — Consult Note (Signed)
[facility]  CONSULT NOTE    Date: 04/17/2018                  Patient Name:  Kelly Mclean  MRN: [id number]  DOB: July 21, 1955  Age / Sex: 63 y.o., female         PCP: Marnee Guarneri, MD                 Service Requesting Consult: Dr. Stark Jock                 Reason for Consult: Acute renal failure, hyponatremia, hyperkalemia            History of Present Illness: Kelly Mclean is a 63 y.o. 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 Patient came to the ED where she was found to have critical potassium and sodium. Shifted her potassium and started on IV saline. Improved sodium and potassium.   Patient with shortness of breath. 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 albuterol (PROVENTIL) (2.5 MG/3ML) 0.083% nebulizer solution Inhale 3 mLs (2.5 mg total) into the lungs every 6 (six) hours as needed for wheezing or shortness of breath. 75 mL 12 prn at prn  . busPIRone (BUSPAR) 10 MG tablet Take 10 mg by mouth 3 (three) times daily.   04/16/2018 at Unknown time  . calcium carbonate (TUMS - DOSED IN MG ELEMENTAL CALCIUM) 500 MG chewable tablet Chew 2 tablets by mouth 2 (two) times daily.   04/16/2018 at 0800  . citalopram (CELEXA) 40 MG tablet Take 40 mg by mouth daily.    04/16/2018 at 0800  . docusate sodium (COLACE) 100 MG capsule Take 100 mg by mouth 2 (two) times daily.   04/16/2018 at 0800  . famotidine (PEPCID) 40 MG tablet Take 40 mg by mouth at bedtime.   04/15/2018 at 2130  . gabapentin (NEURONTIN) 100 MG capsule Take 200 mg by mouth at bedtime.   04/15/2018 at 2130  . ondansetron (ZOFRAN) 4 MG tablet Take 4 mg by mouth every 6 (six) hours as  needed for nausea or vomiting.   prn at prn  . primidone (MYSOLINE) 50 MG tablet Take 50 mg by mouth 2 (two) times daily.   04/16/2018 at 0800  . traMADol (ULTRAM) 50 MG tablet Take 50 mg by mouth every 6 (six) hours as needed.   prn at prn    Current medications: Current Facility-Administered Medications  Medication Dose Route Frequency Provider Last Rate Last Dose  . 0.9 %  sodium chloride infusion   Intravenous Continuous Salary, Montell D, MD 100 mL/hr at 04/17/18 0300    . acetaminophen (TYLENOL) tablet 650 mg  650 mg Oral Q6H PRN Salary, Montell D, MD       Or  . acetaminophen (TYLENOL) suppository 650 mg  650 mg Rectal Q6H PRN Salary, Montell D, MD      . albuterol (PROVENTIL) (2.5 MG/3ML) 0.083% nebulizer solution 2.5 mg  2.5 mg Inhalation Q6H PRN Salary, Montell D, MD      . albuterol (PROVENTIL) (2.5 MG/3ML) 0.083% nebulizer solution 2.5 mg  2.5 mg Nebulization Q6H Salary, Montell D, MD   2.5 mg at 04/17/18 0147  .  busPIRone (BUSPAR) tablet 10 mg  10 mg Oral TID Loney Hering D, MD   10 mg at 04/16/18 2148  . calcium carbonate (TUMS - dosed in mg elemental calcium) chewable tablet 400 mg of elemental calcium  2 tablet Oral BID Salary, Montell D, MD   400 mg of elemental calcium at 04/16/18 2147  . citalopram (CELEXA) tablet 40 mg  40 mg Oral Daily Salary, Montell D, MD      . docusate sodium (COLACE) capsule 100 mg  100 mg Oral BID Salary, Montell D, MD      . famotidine (PEPCID) tablet 20 mg  20 mg Oral QHS Ojie, Jude, MD      . gabapentin (NEURONTIN) capsule 200 mg  200 mg Oral QHS Salary, Montell D, MD   200 mg at 04/16/18 2148  . heparin injection 5,000 Units  5,000 Units Subcutaneous Q8H Salary, Montell D, MD   5,000 Units at 04/17/18 0451  . MEDLINE mouth rinse  15 mL Mouth Rinse BID Ojie, Jude, MD      . ondansetron (ZOFRAN) tablet 4 mg  4 mg Oral Q6H PRN Salary, Montell D, MD      . patiromer (VELTASSA) packet 16.8 g  16.8 g Oral Daily Salary, Montell D, MD      .  polyethylene glycol (MIRALAX / GLYCOLAX) packet 17 g  17 g Oral Daily PRN Salary, Montell D, MD      . primidone (MYSOLINE) tablet 50 mg  50 mg Oral BID Loney Hering D, MD   50 mg at 04/16/18 2148  . simethicone (MYLICON) chewable tablet 80 mg  80 mg Oral QID PRN Arta Silence, MD   80 mg at 04/17/18 0202  . sodium chloride tablet 2 g  2 g Oral TID WC Salary, Montell D, MD   2 g at 04/16/18 2005  . traMADol (ULTRAM) tablet 50 mg  50 mg Oral Q6H PRN Loney Hering D, MD   50 mg at 04/17/18 0253      Allergies: Allergies  Allergen Reactions  . Biaxin [Clarithromycin] Shortness Of Breath and Rash  . Penicillins Anaphylaxis and Other (See Comments)    Did it involve swelling of the face/tongue/throat, SOB, or low BP? Unknown Did it involve sudden or severe rash/hives, skin peeling, or any reaction on the inside of your mouth or nose? Unknown Did you need to seek medical attention at a hospital or doctor's office? Yes When did it last happen?childhood If all above answers are "NO", may proceed with cephalosporin use.    Marland Kitchen Zoloft [Sertraline Hcl] Other (See Comments)    Reaction: "made crazy"  . Milk-Related Compounds Diarrhea  . Lactose Intolerance (Gi)   . Other Other (See Comments)    Laughing gas pt turned gray,general anesthesia      Past Medical History: Past Medical History:  Diagnosis Date  . Anemia   . Asthma   . Cirrhosis ([place])   . Hypertension   . Pleural effusion      Past Surgical History: Past Surgical History:  Procedure Laterality Date  . IR GUIDED DRAIN W CATHETER PLACEMENT  04/04/2018  . IR US GUIDE VASC ACCESS LEFT  04/04/2018  . TUBAL LIGATION       Family History: Family History  Problem Relation Age of Onset  . Breast cancer Maternal Aunt 84     Social History: Social History   Socioeconomic History  . Marital status: Married    Spouse name: Not on file  .  Number of children: Not on file  . Years of education: Not on file  .  Highest education level: Not on file  Occupational History  . Not on file  Social Needs  . Financial resource strain: Not on file  . Food insecurity:    Worry: Not on file    Inability: Not on file  . Transportation needs:    Medical: Not on file    Non-medical: Not on file  Tobacco Use  . Smoking status: Former Smoker    Last attempt to quit: 1990    Years since quitting: 30.2  . Smokeless tobacco: Never Used  Substance and Sexual Activity  . Alcohol use: Not Currently  . Drug use: Not Currently  . Sexual activity: Not Currently  Lifestyle  . Physical activity:    Days per week: Not on file    Minutes per session: Not on file  . Stress: Not on file  Relationships  . Social connections:    Talks on phone: Not on file    Gets together: Not on file    Attends religious service: Not on file    Active member of club or organization: Not on file    Attends meetings of clubs or organizations: Not on file    Relationship status: Not on file  . Intimate partner violence:    Fear of current or ex partner: Not on file    Emotionally abused: Not on file    Physically abused: Not on file    Forced sexual activity: Not on file  Other Topics Concern  . Not on file  Social History Narrative   Patient moved from [place].  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  Lives with her husband in [place].  No alcohol abuse or smoking.     Review of Systems: Review of Systems  Constitutional: Positive for malaise/fatigue and weight loss. Negative for chills, diaphoresis and fever.  HENT: Negative.  Negative for congestion, ear discharge, ear pain, hearing loss, nosebleeds, sinus pain, sore throat and tinnitus.   Eyes: Negative.  Negative for blurred vision, double vision, photophobia, pain, discharge and redness.  Respiratory: Positive for shortness of breath. Negative for cough, hemoptysis, sputum production, wheezing and stridor.   Cardiovascular: Negative.  Negative for chest pain,  palpitations, orthopnea, claudication, leg swelling and PND.  Gastrointestinal: Negative.  Negative for abdominal pain, blood in stool, constipation, diarrhea, heartburn, melena, nausea and vomiting.  Genitourinary: Negative.  Negative for dysuria, flank pain, frequency, hematuria and urgency.  Musculoskeletal: Negative.  Negative for back pain, falls, joint pain, myalgias and neck pain.  Skin: Negative.  Negative for itching and rash.  Neurological: Negative.  Negative for dizziness, tingling, tremors, sensory change, speech change, focal weakness, seizures, loss of consciousness, weakness and headaches.  Endo/Heme/Allergies: Negative for environmental allergies and polydipsia. Does not bruise/bleed easily.  Psychiatric/Behavioral: Negative.  Negative for depression, hallucinations, memory loss, substance abuse and suicidal ideas. The patient is not nervous/anxious and does not have insomnia.     Vital Signs: Blood pressure (!) 133/50, pulse (!) 120, temperature 98.6 F (37 C), temperature source Oral, resp. rate 20, height 5' 2"  (1.575 m), weight 69.1 kg, SpO2 100 %.  Weight trends: Filed Weights   04/16/18 1730 04/16/18 2119 04/17/18 0454  Weight: 65.8 kg 68.4 kg 69.1 kg    Physical Exam: General: NAD, laying in bed  Head: Normocephalic, atraumatic. Moist oral mucosal membranes  Eyes: Anicteric, PERRL  Neck: Supple, trachea midline  Lungs:  diminished at bases  Heart: Regular rate and rhythm  Abdomen:  Soft, nontender,   Extremities: No peripheral edema.  Neurologic: Nonfocal, moving all four extremities  Skin: No lesions     Lab results: Basic Metabolic Panel: Recent Labs  Lab 04/16/18 1739 04/17/18 0407  NA 120* 125*  K 7.4* 6.2*  CL 91* 98  CO2 23 22  GLUCOSE 84 86  BUN 32* 28*  CREATININE 2.15* 2.10*  CALCIUM 9.7 9.8    Liver Function Tests: Recent Labs  Lab 04/16/18 1739  AST 60*  ALT 37  ALKPHOS 137*  BILITOT 2.4*  PROT 7.3  ALBUMIN 3.9   No  results for input(s): LIPASE, AMYLASE in the last 168 hours. No results for input(s): AMMONIA in the last 168 hours.  CBC: Recent Labs  Lab 04/16/18 1739  WBC 7.6  NEUTROABS 5.4  HGB 10.9*  HCT 30.9*  MCV 92.0  PLT 165    Cardiac Enzymes: No results for input(s): CKTOTAL, CKMB, CKMBINDEX, TROPONINI in the last 168 hours.  BNP: Invalid input(s): POCBNP  CBG: No results for input(s): GLUCAP in the last 168 hours.  Microbiology: Results for orders placed or performed during the hospital encounter of 03/14/18  Body fluid culture     Status: None   Collection Time: 03/14/18  3:50 PM  Result Value Ref Range Status   Specimen Description   Final    PLEURAL Performed at [facility], [address] [place], [place] [address]    Special Requests   Final    NONE Performed at [facility], [place], [place] [address]    Gram Stain NO WBC SEEN NO ORGANISMS SEEN   Final   Culture   Final    NO GROWTH 3 DAYS Performed at [facility], [place] [address] [place], [place] [address]    Report Status 03/18/2018 FINAL  Final  Body fluid culture     Status: None   Collection Time: 03/19/18  3:50 PM  Result Value Ref Range Status   Specimen Description   Final    PLEURAL Performed at [facility], [address], [place], [place] [address]    Special Requests   Final    NONE Performed at [facility], [address] [place], [place] [address]    Gram Stain   Final    FEW WBC PRESENT, PREDOMINANTLY MONONUCLEAR NO ORGANISMS SEEN    Culture   Final    NO GROWTH 3 DAYS Performed at [facility], [place] [address] [place], [place] [address]    Report Status 03/23/2018 FINAL  Final  Body fluid culture     Status: None   Collection Time: 03/19/18  4:00 PM  Result Value Ref Range Status   Specimen Description   Final    PERITONEAL Performed at [facility], [address], [place], [place] [address]     Special Requests   Final    NONE Performed at [facility], [address] [place], [place] [address]    Gram Stain   Final    RARE WBC PRESENT, PREDOMINANTLY MONONUCLEAR NO ORGANISMS SEEN    Culture   Final    NO GROWTH 3 DAYS Performed at [facility], [place] [address], [place], [place] [address]    Report Status 03/22/2018 FINAL  Final  Acid Fast Smear (AFB)     Status: None (Preliminary result)   Collection Time: 03/24/18  1:22 PM  Result Value Ref Range Status   AFB Specimen Processing Concentration  Final   Acid Fast Smear Negative  Final    Comment: (NOTE) Performed At: [facility] [place], [place] [id number] Rush Farmer MD [id number]    Source (AFB) PENDING  Incomplete    Coagulation Studies: No results for input(s): LABPROT, INR in the last 72 hours.  Urinalysis: No results for input(s): COLORURINE, LABSPEC, PHURINE, GLUCOSEU, HGBUR, BILIRUBINUR, KETONESUR, PROTEINUR, UROBILINOGEN, NITRITE, LEUKOCYTESUR in the last 72 hours.  Invalid input(s): APPERANCEUR    Imaging:  No results found.   Assessment & Plan: Kelly Mclean is a 63 y.o. 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 Acute renal failure 2. Chronic Kidney Disease stage IV: baseline creatinine of 2.32, GFR 24.  3. Hyperkalemia 4. Hyponatremia 5. Anemia  Plan Continue veltassa and sodium chloride infusion Continue salt tablets   LOS: 1 Kelly Mclean 3/26/20209:13 AM

## 2018-04-17 NOTE — Progress Notes (Signed)
PT Cancellation Note  Patient Details Name: Kelly Mclean MRN: [id number] DOB: January 24, 1955   Cancelled Treatment:    Reason Eval/Treat Not Completed: Other (comment)(Per PT guidelines, pt held this AM due to significantly elevated potassium. PT will follow up as able. )   Lieutenant Diego PT, DPT 8:45 AM,04/17/18 [phone]

## 2018-04-18 LAB — BASIC METABOLIC PANEL
Anion gap: 9 (ref 5–15)
BUN: 27 mg/dL — ABNORMAL HIGH (ref 8–23)
CO2: 20 mmol/L — AB (ref 22–32)
Calcium: 9.6 mg/dL (ref 8.9–10.3)
Chloride: 101 mmol/L (ref 98–111)
Creatinine, Ser: 2.1 mg/dL — ABNORMAL HIGH (ref 0.44–1.00)
GFR calc non Af Amer: 25 mL/min — ABNORMAL LOW (ref >60)
GFR, EST AFRICAN AMERICAN: 29 mL/min — AB (ref >60)
Glucose, Bld: 94 mg/dL (ref 70–99)
Potassium: 4.6 mmol/L (ref 3.5–5.1)
Sodium: 130 mmol/L — ABNORMAL LOW (ref 135–145)

## 2018-04-18 LAB — HEPATITIS B SURFACE ANTIBODY,QUALITATIVE: Hep B S Ab: NONREACTIVE

## 2018-04-18 LAB — HEPATITIS B CORE ANTIBODY, IGM: Hep B C IgM: NEGATIVE

## 2018-04-18 LAB — HEPATITIS C ANTIBODY: HCV Ab: <0.1 s/co ratio (ref 0.0–0.9)

## 2018-04-18 LAB — HEPATITIS B SURFACE ANTIGEN: Hepatitis B Surface Ag: NEGATIVE

## 2018-04-18 LAB — MAGNESIUM: Magnesium: 1.6 mg/dL — ABNORMAL LOW (ref 1.7–2.4)

## 2018-04-18 MED ORDER — FAMOTIDINE 20 MG PO TABS: 20.0000 mg | ORAL_TABLET | Freq: Every day | ORAL | 0 refills | Status: AC

## 2018-04-18 MED ORDER — MAGNESIUM OXIDE 400 (241.3 MG) MG PO TABS
800.0000 mg | ORAL_TABLET | Freq: Once | ORAL | Status: AC
  Administered 2018-04-18: 800 mg via ORAL
  Filled 2018-04-18: qty 2

## 2018-04-18 NOTE — TOC Transition Note (Signed)
Transition of Care [facility]) - CM/SW Discharge Note   Patient Details  Name: Rilei Kravitz MRN: [id number] Date of Birth: 25-Jun-1955  Transition [facility] Care [facility]) CM/SW Contact:  Elza Rafter, RN Phone Number: 04/18/2018, 11:32 AM   Clinical Narrative:   Discharging today with [facility].   Final next level of care: Home/Self Care([facility] Program) Barriers to Discharge: No Barriers Identified   Patient Goals and CMS Choice Patient states their goals for this hospitalization and ongoing recovery are:: Discharge back to home with the [facility] program      Discharge Placement                       Discharge Plan and Services   Discharge Planning Services: CM Consult Post Acute Care Choice: ([facility] Program)              HH Arranged: (Will be arranged through [facility])     Social Determinants of Health (SDOH) Interventions     Readmission Risk Interventions No flowsheet data found.

## 2018-04-18 NOTE — Progress Notes (Signed)
[facility]  ROUNDING NOTE   Subjective:   Working with PT this morning.  Creatinine 2.1 (2.19)  Na 130 (125) K 4.6 (6.2)   Objective:  Vital signs in last 24 hours:  Temp:  [98.2 F (36.8 C)-98.6 F (37 C)] 98.6 F (37 C) (03/27 0411) Pulse Rate:  [100-119] 104 (03/27 0607) Resp:  [18] 18 (03/27 0411) BP: (112-135)/(53-76) 132/61 (03/27 0411) SpO2:  [90 %-100 %] 90 % (03/27 0915) FiO2 (%):  [32 %] 32 % (03/27 0209) Weight:  [68.3 kg] 68.3 kg (03/27 0411)  Weight change: 2.52 kg Filed Weights   04/16/18 2119 04/17/18 0454 04/18/18 0411  Weight: 68.4 kg 69.1 kg 68.3 kg    Intake/Output: I/O last 3 completed shifts: In: 967.2 [I.V.:467.2; IV Piggyback:500] Out: 300 [Urine:300]   Intake/Output this shift:  No intake/output data recorded.  Physical Exam: General: NAD,   Head: Normocephalic, atraumatic. Moist oral mucosal membranes  Eyes: Anicteric, PERRL  Neck: Supple, trachea midline  Lungs:  Clear to auscultation  Heart: Regular rate and rhythm  Abdomen:  Soft, nontender,   Extremities:  no peripheral edema.  Neurologic: Nonfocal, moving all four extremities  Skin: No lesions        Basic Metabolic Panel: Recent Labs  Lab 04/16/18 1739 04/17/18 0407 04/17/18 1215 04/18/18 0440  NA 120* 125* 129* 130*  K 7.4* 6.2* 5.3* 4.6  CL 91* 98 101 101  CO2 23 22 20* 20*  GLUCOSE 84 86 112* 94  BUN 32* 28* 27* 27*  CREATININE 2.15* 2.10* 2.19* 2.10*  CALCIUM 9.7 9.8 10.0 9.6  MG  --   --   --  1.6*    Liver Function Tests: Recent Labs  Lab 04/16/18 1739  AST 60*  ALT 37  ALKPHOS 137*  BILITOT 2.4*  PROT 7.3  ALBUMIN 3.9   No results for input(s): LIPASE, AMYLASE in the last 168 hours. No results for input(s): AMMONIA in the last 168 hours.  CBC: Recent Labs  Lab 04/16/18 1739  WBC 7.6  NEUTROABS 5.4  HGB 10.9*  HCT 30.9*  MCV 92.0  PLT 165    Cardiac Enzymes: No results for input(s): CKTOTAL, CKMB, CKMBINDEX, TROPONINI  in the last 168 hours.  BNP: Invalid input(s): POCBNP  CBG: No results for input(s): GLUCAP in the last 168 hours.  Microbiology: Results for orders placed or performed during the hospital encounter of 03/14/18  Body fluid culture     Status: None   Collection Time: 03/14/18  3:50 PM  Result Value Ref Range Status   Specimen Description   Final    PLEURAL Performed at [facility], [address] [place], [place] [address]    Special Requests   Final    NONE Performed at [facility], [place], [place] [address]    Gram Stain NO WBC SEEN NO ORGANISMS SEEN   Final   Culture   Final    NO GROWTH 3 DAYS Performed at [facility], [place] [address] [place], [place] [address]    Report Status 03/18/2018 FINAL  Final  Body fluid culture     Status: None   Collection Time: 03/19/18  3:50 PM  Result Value Ref Range Status   Specimen Description   Final    PLEURAL Performed at [facility], [address] [place], [place] [address]    Special Requests   Final    NONE Performed at [facility],  [place], [place] [address]    Gram Stain   Final    FEW WBC PRESENT, PREDOMINANTLY MONONUCLEAR NO ORGANISMS SEEN    Culture   Final    NO GROWTH 3 DAYS Performed at [place] [address] [place], [place] [address]    Report Status 03/23/2018 FINAL  Final  Body fluid culture     Status: None   Collection Time: 03/19/18  4:00 PM  Result Value Ref Range Status   Specimen Description   Final    PERITONEAL Performed at [facility], [address], [place], [place] [address]    Special Requests   Final    NONE Performed at [facility], [address] [place], [place] [address]    Gram Stain   Final    RARE WBC PRESENT, PREDOMINANTLY MONONUCLEAR NO ORGANISMS SEEN    Culture   Final    NO GROWTH 3 DAYS Performed at [facility], [place] [address]  [place], [place] [address]    Report Status 03/22/2018 FINAL  Final  Acid Fast Smear (AFB)     Status: None (Preliminary result)   Collection Time: 03/24/18  1:22 PM  Result Value Ref Range Status   AFB Specimen Processing Concentration  Final   Acid Fast Smear Negative  Final    Comment: (NOTE) Performed At: [facility] [place], [place] [id number] Rush Farmer MD [id number]    Source (AFB) PENDING  Incomplete    Coagulation Studies: No results for input(s): LABPROT, INR in the last 72 hours.  Urinalysis: Recent Labs    04/17/18 1330  COLORURINE YELLOW*  LABSPEC 1.010  PHURINE 6.0  GLUCOSEU NEGATIVE  HGBUR LARGE*  BILIRUBINUR NEGATIVE  KETONESUR NEGATIVE  PROTEINUR NEGATIVE  NITRITE NEGATIVE  LEUKOCYTESUR NEGATIVE      Imaging: US Renal  Result Date: 04/17/2018 CLINICAL DATA:  Acute kidney failure, stage IV chronic kidney disease EXAM: RENAL / URINARY TRACT ULTRASOUND COMPLETE COMPARISON:  03/30/2018 FINDINGS: Right Kidney: Renal measurements: 9.1 x 4.4 x 5.0 cm = volume: 104 mL. Normal cortical thickness. Increased cortical echogenicity. No mass, hydronephrosis or shadowing calcification. Left Kidney: Renal measurements: 8.1 x 4.7 x 3.8 cm = volume: 74 mL. Mild cortical thinning. Increased cortical echogenicity. No mass, hydronephrosis or shadowing calcification. Bladder: Appears normal for degree of bladder distention. Scattered ascites. Nodular liver consistent with cirrhosis. IMPRESSION: Medical renal disease changes of both kidneys without mass or hydronephrosis. Cirrhotic liver with ascites. Electronically Signed   By: Lavonia Dana M.D.   On: 04/17/2018 10:32     Medications:    . albuterol  2.5 mg Nebulization Q6H  . busPIRone  10 mg Oral TID  . calcium carbonate  2 tablet Oral BID  . citalopram  40 mg Oral Daily  . docusate sodium  100 mg Oral BID  . famotidine  20 mg Oral QHS  . gabapentin  200 mg Oral QHS  . heparin  5,000 Units  Subcutaneous Q8H  . magnesium oxide  800 mg Oral Once  . mouth rinse  15 mL Mouth Rinse BID  . patiromer  16.8 g Oral Daily  . primidone  50 mg Oral BID   acetaminophen **OR** acetaminophen, albuterol, ondansetron, polyethylene glycol, simethicone, traMADol  Assessment/ Plan:  Ms. Kelly Mclean is a 63 y.o.  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 Acute  renal failure 2. Chronic Kidney Disease stage IV: baseline creatinine of 2.32, GFR 24.  3. Hyperkalemia 4. Hyponatremia 5. Anemia  Plan Continue veltassa  Off sodium chloride.  Encourage PO intake.    LOS: 2 Kelly Mclean 3/27/202010:18 AM

## 2018-04-18 NOTE — Discharge Summary (Signed)
[place] [facility] [place] NAME: Kelly Mclean    MR#:  [id number]  DATE OF BIRTH:  1955/11/24  DATE OF ADMISSION:  04/16/2018   ADMITTING PHYSICIAN: Gorden Harms, MD  DATE OF DISCHARGE: 04/18/2018  PRIMARY CARE PHYSICIAN: Marnee Guarneri, MD   ADMISSION DIAGNOSIS:  Acute hyperkalemia [E87.5] DISCHARGE DIAGNOSIS:  Active Problems:   Acute hyperkalemia   NASH (nonalcoholic steatohepatitis)   DNR (do not resuscitate)   Weakness generalized  SECONDARY DIAGNOSIS:   Past Medical History:  Diagnosis Date  . Anemia   . Asthma   . Cirrhosis ([place])   . Hypertension   . Pleural effusion    HOSPITAL COURSE:    Chief complaint; fall    HISTORY OF PRESENT ILLNESS: Kelly Mclean  is a 63 y.o. female with a known history of Kelly Mclean cirrhosis with chronic recurring hepatic hydrothorax status post right Pleurx catheter-drainage on Monday/Wednesday/, recent hospital transfer from our hospital earlier this month for possible TIPS procedure to [facility]-patient is not a candidate for procedure, subsequently discharged home with home hospice, returns as patient slid from her bed onto the floor, ER evaluation noted for potassium of 7.4, sodium 120, creatinine at baseline at 2.1, hemoglobin 2.9, hospitalist asked to admit, patient valuated emergency room, no apparent distress, resting comfortably in bed, denies any pain, patient wishes to be DNR going forward.  Please refer to the H&P dictated for further details.  Hospital course; 1 Acute multiple electrolyte abnormalities, acute severe hyponatremia, hyperkalemia, hypochloremia Most likely secondary to end-stage liver disease. Hyponatremia improved with sodium level up to 130 already.  Discontinued normal saline previously. Hyperkalemia treated with potassium level down from 7.4-4.6.  Was treated medically with Kayexalate as well as Veltassa during this admission  2.Chronic Kelly Mclean cirrhosis with  chronic recurring hepatic hydrothorax None candidate for TIPS procedure, hospice appropriate perConeevaluation earlier this month, patient is not ready for hospice at this time. Patient seen by palliative care team.  Patient wishes to continue with palliative services with the [facility] program on discharge from the hospital.  3.Chronic hypoxic respiratory failure Stable Continue home requirement of 2 L via nasal cannula continuous  4.  Chronic asthma without exacerbation Breathing treatments PRN  5. Chronic GERD PPI daily  6.Chronic pain syndrome Stable Continue home regiment  I discussed with the physician with the [facility] program yesterday and she requested to have patient discharged back home today and they will continue care of the patient following discharge.  DISCHARGE CONDITIONS:  Stable CONSULTS OBTAINED:   DRUG ALLERGIES:   Allergies  Allergen Reactions  . Biaxin [Clarithromycin] Shortness Of Breath and Rash  . Penicillins Anaphylaxis and Other (See Comments)    Did it involve swelling of the face/tongue/throat, SOB, or low BP? Unknown Did it involve sudden or severe rash/hives, skin peeling, or any reaction on the inside of your mouth or nose? Unknown Did you need to seek medical attention at a hospital or doctor's office? Yes When did it last happen?childhood If all above answers are "NO", may proceed with cephalosporin use.    Marland Kitchen Zoloft [Sertraline Hcl] Other (See Comments)    Reaction: "made crazy"  . Milk-Related Compounds Diarrhea  . Lactose Intolerance (Gi)   . Other Other (See Comments)    Laughing gas pt turned gray,general anesthesia   DISCHARGE MEDICATIONS:   Allergies as of 04/18/2018      Reactions   Biaxin [clarithromycin] Shortness Of Breath, Rash   Penicillins  Anaphylaxis, Other (See Comments)   Did it involve swelling of the face/tongue/throat, SOB, or low BP? Unknown Did it involve sudden or severe rash/hives, skin peeling, or any reaction  on the inside of your mouth or nose? Unknown Did you need to seek medical attention at a hospital or doctor's office? Yes When did it last happen?childhood If all above answers are "NO", may proceed with cephalosporin use.   Zoloft [sertraline Hcl] Other (See Comments)   Reaction: "made crazy"   Milk-related Compounds Diarrhea   Lactose Intolerance (gi)    Other Other (See Comments)   Laughing gas pt turned gray,general anesthesia      Medication List    TAKE these medications   albuterol (2.5 MG/3ML) 0.083% nebulizer solution Commonly known as:  PROVENTIL Inhale 3 mLs (2.5 mg total) into the lungs every 6 (six) hours as needed for wheezing or shortness of breath.   busPIRone 10 MG tablet Commonly known as:  BUSPAR Take 10 mg by mouth 3 (three) times daily.   calcium carbonate 500 MG chewable tablet Commonly known as:  TUMS - dosed in mg elemental calcium Chew 2 tablets by mouth 2 (two) times daily.   citalopram 40 MG tablet Commonly known as:  CELEXA Take 40 mg by mouth daily.   docusate sodium 100 MG capsule Commonly known as:  COLACE Take 100 mg by mouth 2 (two) times daily.   famotidine 20 MG tablet Commonly known as:  PEPCID Take 1 tablet (20 mg total) by mouth at bedtime. What changed:    medication strength  how much to take   gabapentin 100 MG capsule Commonly known as:  NEURONTIN Take 200 mg by mouth at bedtime.   ondansetron 4 MG tablet Commonly known as:  ZOFRAN Take 4 mg by mouth every 6 (six) hours as needed for nausea or vomiting.   primidone 50 MG tablet Commonly known as:  MYSOLINE Take 50 mg by mouth 2 (two) times daily.   traMADol 50 MG tablet Commonly known as:  ULTRAM Take 50 mg by mouth every 6 (six) hours as needed.        DISCHARGE INSTRUCTIONS:   DIET:  Cardiac diet DISCHARGE CONDITION:  Stable ACTIVITY:  Activity as tolerated OXYGEN:  Home Oxygen: Yes.    Oxygen Delivery: Oxygen via nasal cannula at 2 L. DISCHARGE  LOCATION:  home   If you experience worsening of your admission symptoms, develop shortness of breath, life threatening emergency, suicidal or homicidal thoughts you must seek medical attention immediately by calling 911 or calling your MD immediately  if symptoms less severe.  You Must read complete instructions/literature along with all the possible adverse reactions/side effects for all the Medicines you take and that have been prescribed to you. Take any new Medicines after you have completely understood and accpet all the possible adverse reactions/side effects.   Please note  You were cared for by a hospitalist during your hospital stay. If you have any questions about your discharge medications or the care you received while you were in the hospital after you are discharged, you can call the unit and asked to speak with the hospitalist on call if the hospitalist that took care of you is not available. Once you are discharged, your primary care physician will handle any further medical issues. Please note that NO REFILLS for any discharge medications will be authorized once you are discharged, as it is imperative that you return to your primary care physician (or establish a  relationship with a primary care physician if you do not have one) for your aftercare needs so that they can reassess your need for medications and monitor your lab values.    On the day of Discharge:  VITAL SIGNS:  Blood pressure 132/61, pulse (!) 104, temperature 98.6 F (37 C), temperature source Oral, resp. rate 18, height 5' 2"  (1.575 m), weight 68.3 kg, SpO2 90 %. PHYSICAL EXAMINATION:  GENERAL:  63 y.o.-year-old patient lying in the bed with no acute distress.  EYES: Pupils equal, round, reactive to light and accommodation. No scleral icterus. Extraocular muscles intact.  HEENT: Head atraumatic, normocephalic. Oropharynx and nasopharynx clear.  NECK:  Supple, no jugular venous distention. No thyroid enlargement,  no tenderness.  LUNGS: Normal breath sounds bilaterally, no wheezing, rales,rhonchi or crepitation. No use of accessory muscles of respiration.  CARDIOVASCULAR: S1, S2 normal. No murmurs, rubs, or gallops.  ABDOMEN: Soft, non-tender, non-distended. Bowel sounds present. No organomegaly or mass.  EXTREMITIES: No pedal edema, cyanosis, or clubbing.  NEUROLOGIC: Cranial nerves II through XII are intact. Muscle strength 5/5 in all extremities. Sensation intact. Gait not checked.  PSYCHIATRIC: The patient is alert and oriented x 3.  SKIN: No obvious rash, lesion, or ulcer.  DATA REVIEW:   CBC Recent Labs  Lab 04/16/18 1739  WBC 7.6  HGB 10.9*  HCT 30.9*  PLT 165    Chemistries  Recent Labs  Lab 04/16/18 1739  04/18/18 0440  NA 120*   < > 130*  K 7.4*   < > 4.6  CL 91*   < > 101  CO2 23   < > 20*  GLUCOSE 84   < > 94  BUN 32*   < > 27*  CREATININE 2.15*   < > 2.10*  CALCIUM 9.7   < > 9.6  MG  --   --  1.6*  AST 60*  --   --   ALT 37  --   --   ALKPHOS 137*  --   --   BILITOT 2.4*  --   --    < > = values in this interval not displayed.     Microbiology Results  Results for orders placed or performed during the hospital encounter of 03/14/18  Body fluid culture     Status: None   Collection Time: 03/14/18  3:50 PM  Result Value Ref Range Status   Specimen Description   Final    PLEURAL Performed at [facility], [address] [place], [place] [address]    Special Requests   Final    NONE Performed at [facility], [place], [place] [address]    Gram Stain NO WBC SEEN NO ORGANISMS SEEN   Final   Culture   Final    NO GROWTH 3 DAYS Performed at [facility], [place] [address] [place], [place] [address]    Report Status 03/18/2018 FINAL  Final  Body fluid culture     Status: None   Collection Time: 03/19/18  3:50 PM  Result Value Ref Range Status   Specimen Description   Final    PLEURAL Performed at [facility], [address] [place], [place] [address]    Special Requests   Final    NONE Performed at [facility], [place], [place] [address]    Gram Stain   Final    FEW WBC PRESENT, PREDOMINANTLY MONONUCLEAR NO ORGANISMS SEEN  Culture   Final    NO GROWTH 3 DAYS Performed at [facility], [place] [address], [place], [place] [address]    Report Status 03/23/2018 FINAL  Final  Body fluid culture     Status: None   Collection Time: 03/19/18  4:00 PM  Result Value Ref Range Status   Specimen Description   Final    PERITONEAL Performed at [facility], [address] [place], [place] [address]    Special Requests   Final    NONE Performed at [facility], [place] [place], [place] [address]    Gram Stain   Final    RARE WBC PRESENT, PREDOMINANTLY MONONUCLEAR NO ORGANISMS SEEN    Culture   Final    NO GROWTH 3 DAYS Performed at [facility], [place] [address], [place], [place] [address]    Report Status 03/22/2018 FINAL  Final  Acid Fast Smear (AFB)     Status: None (Preliminary result)   Collection Time: 03/24/18  1:22 PM  Result Value Ref Range Status   AFB Specimen Processing Concentration  Final   Acid Fast Smear Negative  Final    Comment: (NOTE) Performed At: [facility] [place], [place] [id number] Rush Farmer MD GY:[id number]    Source (AFB) PENDING  Incomplete    RADIOLOGY:  No results found.   Management plans discussed with the patient, family and they are in agreement.  CODE STATUS: DNR   TOTAL TIME TAKING CARE OF THIS PATIENT: 38 minutes.    Samuele Storey M.D on 04/18/2018 at 10:46 AM  Between 7am to 6pm - Pager - [phone]  After 6pm go to [email or web address] - Proofreader  [employer] [facility]  Office  [phone]  CC: Primary care physician; Marnee Guarneri, MD   Note: This dictation was prepared with Dragon dictation along with smaller  phrase technology. Any transcriptional errors that result from this process are unintentional.

## 2018-04-18 NOTE — Evaluation (Signed)
Physical Therapy Evaluation Patient Details Name: Kelly Mclean MRN: [id number] DOB: 1955/03/26 Today's Date: 04/18/2018   History of Present Illness  62 y.o. white female with end stage liver disease secondary to NASH, status post right Pleurx catheter-drainage, hypertension,  asthma, who was admitted to [facility] on 04/16/2018 for Acute hyperkalemia; K+ this AM 4.6.    Clinical Impression  Patient alert, mildly shaky, oriented to self, situation, place. Patient reported prior to admission living in a first floor apartment, husband available PRN, and [facility] 2 hrs AM and PM. Ambulates with rollator, assistance needed with ADLs/IADLs.  Patient able to mobilize to EOB with minA, pt preferred assistance though not required. Sit <> stand with CGA/supervision with RW. Pt able to ambulate in room ~24f with RW, HR in 130s, spO2 less than 90% on 3L, bumped to 4L with spO2 90% or better. Ambulates with shuffling step, decreased gait velocity, and mild SOB. Returned to 3L in chair vitals WFLs. Overall the patient demonstrated deficits (see "PT Problem List") that impede the patient's functional abilities, safety, and mobility and would benefit from skilled PT intervention. Recommendation is HHPT with supervision for mobility/OOB.    Patient suffers from end stage liver disease, HTN, asthma, which impairs his/her ability to perform daily activities like toileting, feeding, dressing, grooming, bathing in the home. A walker  will not resolve the patient's issue with performing activities of daily living. A wheelchair is required/recommended and will allow patient to safely perform daily activities.   Patient can safely propel the wheelchair in the home or has a caregiver who can provide assistance.      Follow Up Recommendations Home health PT;Supervision for mobility/OOB    Equipment Recommendations  Wheelchair (measurements PT)    Recommendations for Other Services       Precautions / Restrictions  Precautions Precautions: Fall Restrictions Weight Bearing Restrictions: No      Mobility  Bed Mobility Overal bed mobility: Needs Assistance Bed Mobility: Supine to Sit     Supine to sit: Min assist     General bed mobility comments: Pt reaches for hand, minA provided though not required  Transfers Overall transfer level: Needs assistance Equipment used: Rolling walker (2 wheeled) Transfers: Sit to/from Stand Sit to Stand: Supervision;Min guard            Ambulation/Gait Ambulation/Gait assistance: Min guard Gait Distance (Feet): 40 Feet Assistive device: Rolling walker (2 wheeled)   Gait velocity: decreased   General Gait Details: Pt on 2L and then 3L to maintain 90% or better. HR 130s   Stairs            Wheelchair Mobility    Modified Rankin (Stroke Patients Only)       Balance Overall balance assessment: Needs assistance Sitting-balance support: Feet supported Sitting balance-Leahy Scale: Fair     Standing balance support: Bilateral upper extremity supported Standing balance-Leahy Scale: Fair Standing balance comment: Able to let go of RW during static standing                             Pertinent Vitals/Pain Faces Pain Scale: Hurts a little bit Pain Location: L flank Pain Descriptors / Indicators: Aching(itching) Pain Intervention(s): Limited activity within patient's tolerance;Monitored during session;Repositioned    Home Living Family/patient expects to be discharged to:: Private residence Living Arrangements: Spouse/significant other Available Help at Discharge: Family;Available PRN/intermittently Type of Home: Apartment Home Access: Level entry     Home Layout: One  level Home Equipment: Walker - 4 wheels;Bedside commode;Hospital bed Additional Comments: uses her rollator    Prior Function Level of Independence: Needs assistance   Gait / Transfers Assistance Needed: uses rollator for ambulation  ADL's /  Homemaking Assistance Needed: [facility] assists with IADLs/ADLs per patient 5x a week, a couple hours in the AM and PM.         Hand Dominance        Extremity/Trunk Assessment   Upper Extremity Assessment Upper Extremity Assessment: Generalized weakness    Lower Extremity Assessment Lower Extremity Assessment: Generalized weakness       Communication   Communication: No difficulties  Cognition Arousal/Alertness: Awake/alert Behavior During Therapy: WFL for tasks assessed/performed Overall Cognitive Status: Within Functional Limits for tasks assessed                                 General Comments: Reports fatigue and easily becomes SOB      General Comments      Exercises     Assessment/Plan    PT Assessment Patient needs continued PT services  PT Problem List Decreased strength;Decreased activity tolerance;Decreased balance;Decreased mobility;Decreased knowledge of use of DME;Decreased safety awareness;Cardiopulmonary status limiting activity       PT Treatment Interventions DME instruction;Gait training;Functional mobility training;Therapeutic activities;Therapeutic exercise;Balance training;Patient/family education;Stair training;Neuromuscular re-education    PT Goals (Current goals can be found in the Care Plan section)  Acute Rehab PT Goals Patient Stated Goal: to go home PT Goal Formulation: With patient Time For Goal Achievement: 05/02/18 Potential to Achieve Goals: Good    Frequency Min 2X/week   Barriers to discharge        Co-evaluation               AM-PAC PT "6 Clicks" Mobility  Outcome Measure Help needed turning from your back to your side while in a flat bed without using bedrails?: A Little Help needed moving from lying on your back to sitting on the side of a flat bed without using bedrails?: A Little Help needed moving to and from a bed to a chair (including a wheelchair)?: A Little Help needed standing up from a  chair using your arms (e.g., wheelchair or bedside chair)?: A Little Help needed to walk in hospital room?: A Little Help needed climbing 3-5 steps with a railing? : A Lot 6 Click Score: 17    End of Session Equipment Utilized During Treatment: Gait belt;Oxygen(4L) Activity Tolerance: Patient limited by fatigue Patient left: with chair alarm set;in chair;with SCD's reapplied;with call bell/phone within reach Nurse Communication: Mobility status PT Visit Diagnosis: Other abnormalities of gait and mobility (R26.89);Muscle weakness (generalized) (M62.81);Unsteadiness on feet (R26.81)    Time: 3557-3220 PT Time Calculation (min) (ACUTE ONLY): 34 min   Charges:   PT Evaluation $PT Eval Moderate Complexity: 1 Mod PT Treatments $Therapeutic Activity: 8-22 mins        Lieutenant Diego PT, DPT 9:28 AM,04/18/18 [phone]

## 2018-04-18 NOTE — Progress Notes (Signed)
IV removed from rt [facility]

## 2018-04-18 NOTE — TOC Initial Note (Signed)
Transition of Care [facility]) - Initial/Assessment Note    Patient Details  Name: Kelly Mclean MRN: [id number] Date of Birth: [birth year]  Transition of Care [facility]) CM/SW Contact:    Elza Rafter, RN Phone Number: 04/18/2018, 11:26 AM  Clinical Narrative:     Patient is from home with husband.  Discharging today to [facility] for assessment and then the [facility] RN will follow patient to home.  Patient denies difficulties obtaining medications or accessing medical care.  All medications are obtained through [facility].  [facility] is arranging to have a wheelchair here at DC and portable oxygen.  Notified Trudy with [facility] that patient will be ready for DC at 1pm.                Expected Discharge Plan: Home/Self Care([facility] Program) Barriers to Discharge: No Barriers Identified   Patient Goals and CMS Choice Patient states their goals for this hospitalization and ongoing recovery are:: Discharge back to home with the [facility] program      Expected Discharge Plan and Services Expected Discharge Plan: Home/Self Care([facility] Program)   Discharge Planning Services: CM Consult Post Acute Care Choice: ([facility] Program) Living arrangements for the past 2 months: Apartment Expected Discharge Date: 04/18/18                   [facility] Arranged: (Will be arranged through [facility])    Prior Living Arrangements/Services Living arrangements for the past 2 months: Apartment Lives with:: Spouse   Do you feel safe going back to the place where you live?: Yes            Criminal Activity/Legal Involvement Pertinent to Current Situation/Hospitalization: No - Comment as needed  Activities of Daily Living Home Assistive Devices/Equipment: CPAP, Walker (specify type) ADL Screening (condition at time of admission) Patient's cognitive ability adequate to safely complete daily activities?: Yes Is the patient deaf or have difficulty hearing?: No Does the patient have difficulty seeing, even when wearing glasses/contacts?: No Does the  patient have difficulty concentrating, remembering, or making decisions?: No Patient able to express need for assistance with ADLs?: Yes Does the patient have difficulty dressing or bathing?: No Independently performs ADLs?: Yes (appropriate for developmental age) Does the patient have difficulty walking or climbing stairs?: Yes Weakness of Legs: None Weakness of Arms/Hands: None  Permission Sought/Granted Permission sought to share information with : Facility Art therapist granted to share information with : Yes, Verbal Permission Granted     Permission granted to share info w AGENCY: [facility]        Emotional Assessment Appearance:: Appears stated age Attitude/Demeanor/Rapport: Gracious Affect (typically observed): Accepting Orientation: : Oriented to Self, Oriented to Place, Oriented to  Time, Oriented to Situation Alcohol / Substance Use: Not Applicable    Admission diagnosis:  Acute hyperkalemia [E87.5] Patient Active Problem List   Diagnosis Date Noted  . NASH (nonalcoholic steatohepatitis)   . DNR (do not resuscitate)   . Weakness generalized   . Acute hyperkalemia 04/16/2018  . Pleural effusion associated with hepatic disorder   . Renal failure   . Chronic liver failure without hepatic coma ([place])   . Advanced care planning/counseling discussion   . Goals of care, counseling/discussion   . Palliative care by specialist   . Cirrhosis ([place]) 03/28/2018  . Acute kidney injury superimposed on chronic kidney disease ([place]) 03/28/2018  . Ascites   . Hydrothorax   . Lymphocytosis 03/23/2018  . Acute respiratory failure ([place]) 03/14/2018   PCP:  Leward Quan  Chauncey Cruel, MD Pharmacy:   [facility] [place] [address] [place] [address] Phone: [phone] Fax: [phone]     Social Determinants of Health (SDOH) Interventions    Readmission Risk Interventions No flowsheet data found.

## 2018-05-23 DEATH — deceased

## 2018-05-26 LAB — ACID FAST CULTURE WITH REFLEXED SENSITIVITIES (MYCOBACTERIA): Acid Fast Culture: NEGATIVE

## 2018-06-03 LAB — ACID FAST SMEAR (AFB, MYCOBACTERIA): Acid Fast Smear: NEGATIVE

## 2019-05-05 IMAGING — US US THORACENTESIS ASP PLEURAL SPACE W/IMG GUIDE
1 series · 6 of 6 positions shown · non-contrast
Comparison: none

INDICATION: Patient with history of hepatic hydrothorax, cirrhosis and recurrent
left pleural effusion who presents today originally for paracentesis
in order to obtain flow cytometry to r/o malignancy prior to
possible TIPS procedure. Limited abdominal US shows scant peritoneal
fluid not amenable to percutaneous drainage. Diagnostic and
therapeutic thoracentesis performed in order to obtain requested
labs in lieu of paracentesis given patient's history of hepatic
hydrothorax.

[Series 1: us thoracentesis asp pleural space w/img guide · 6 of 6 slices shown]
[im 1/6]
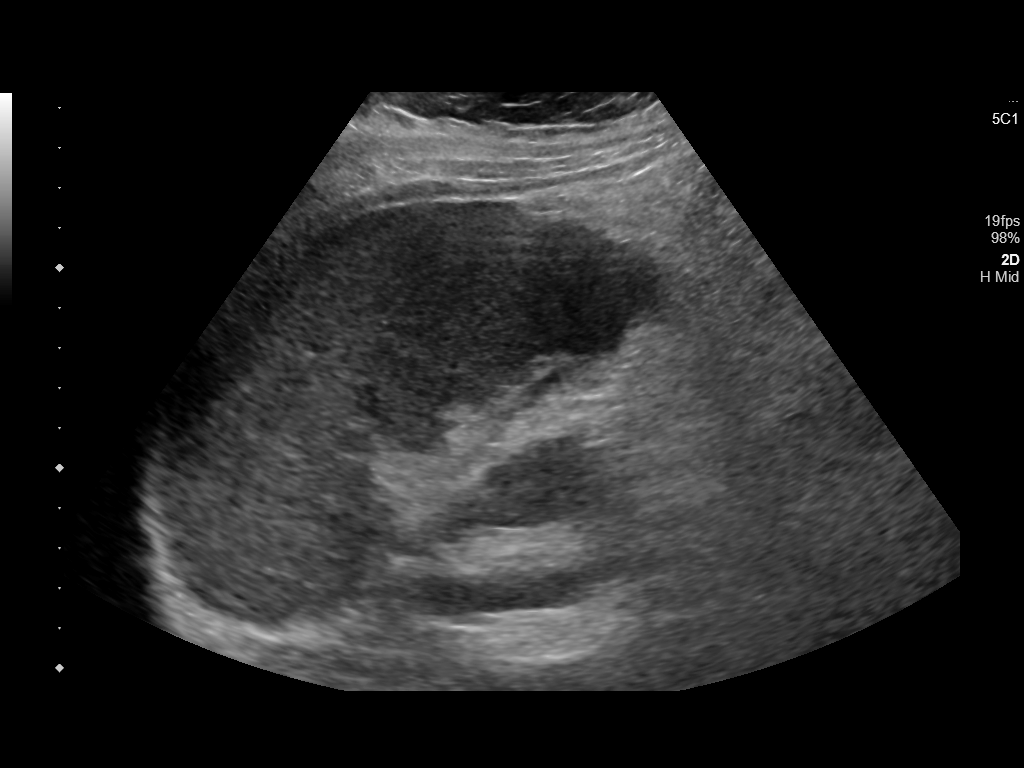
[im 2/6]
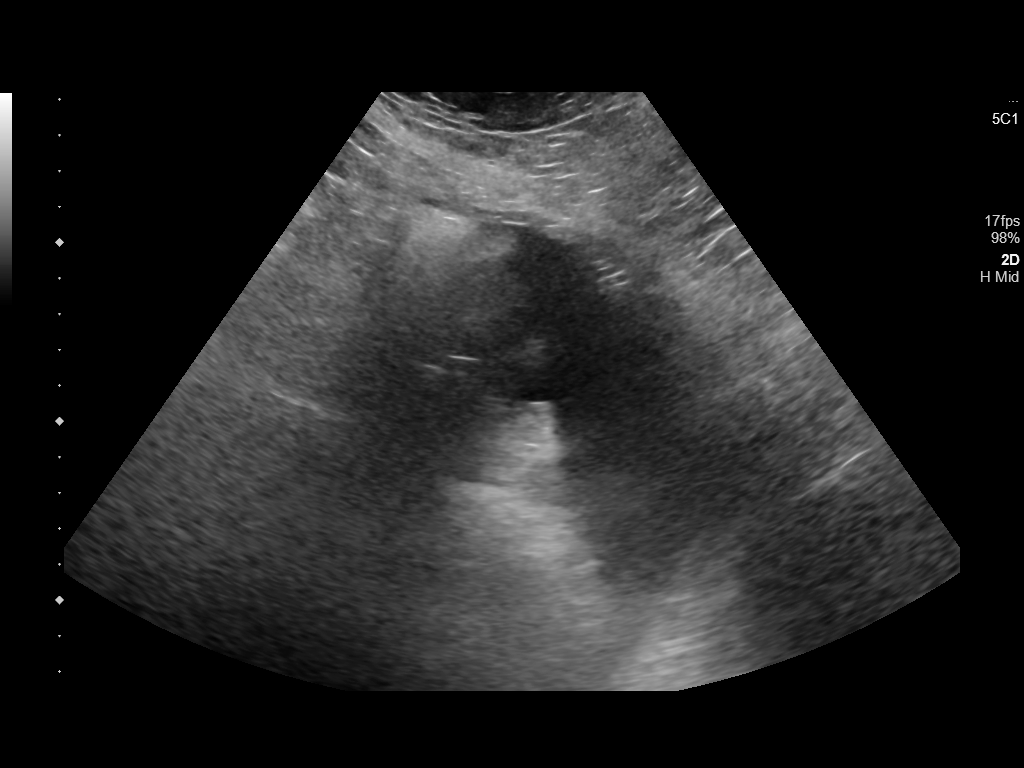
[im 3/6]
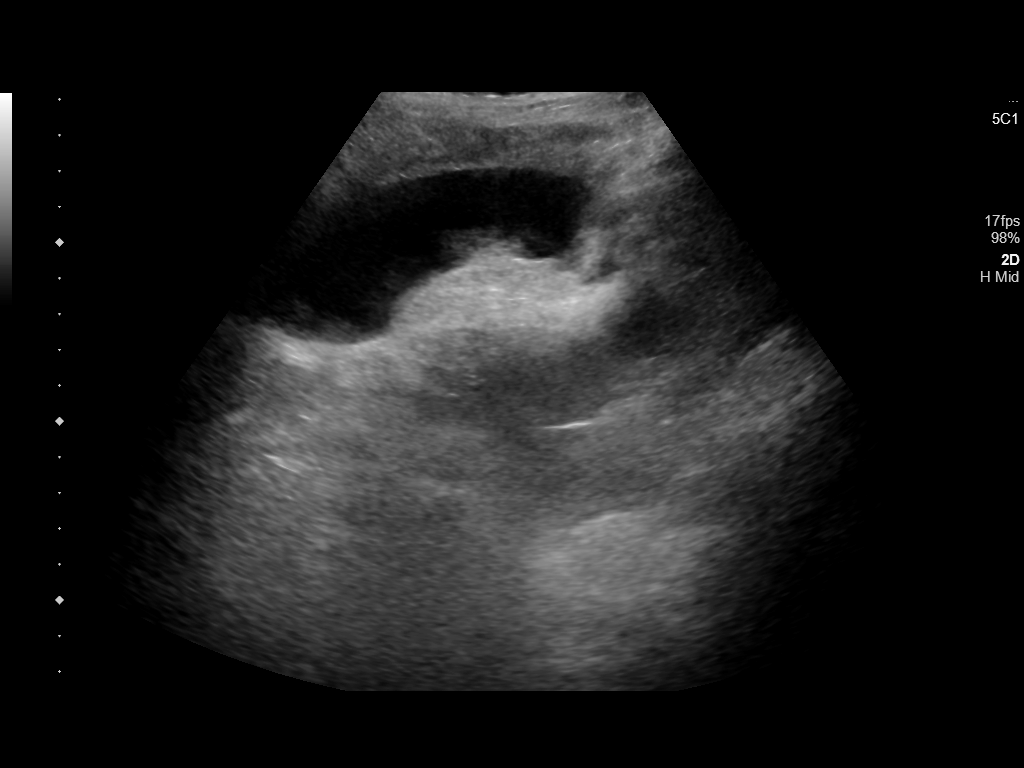
[im 4/6]
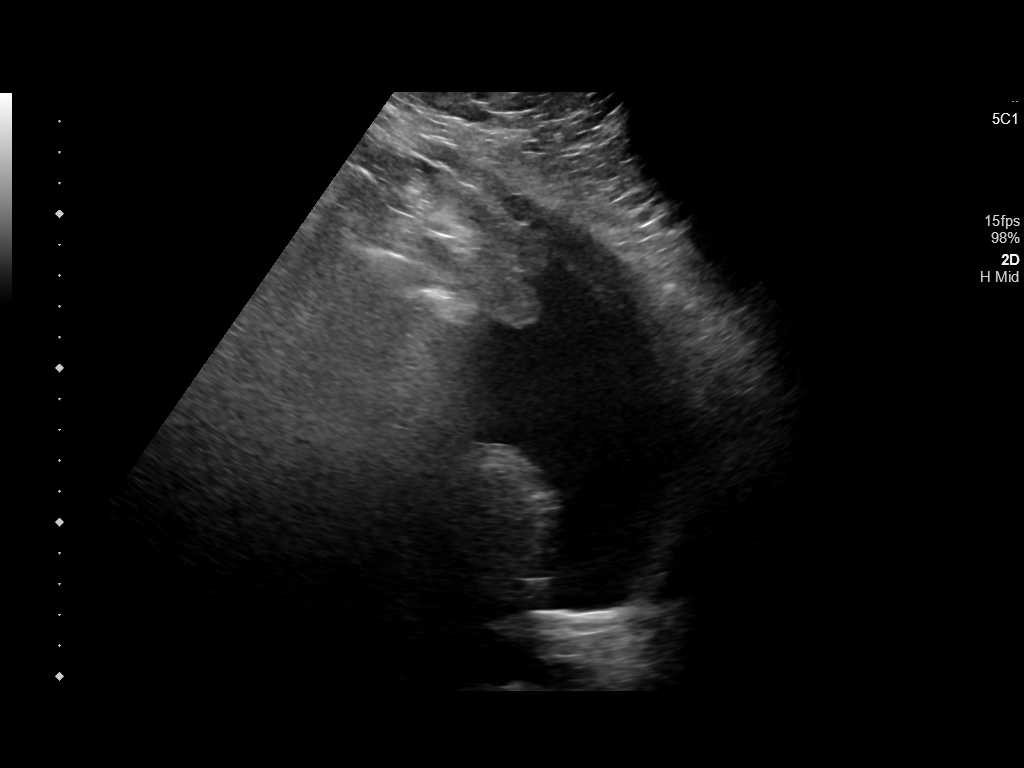
[im 5/6]
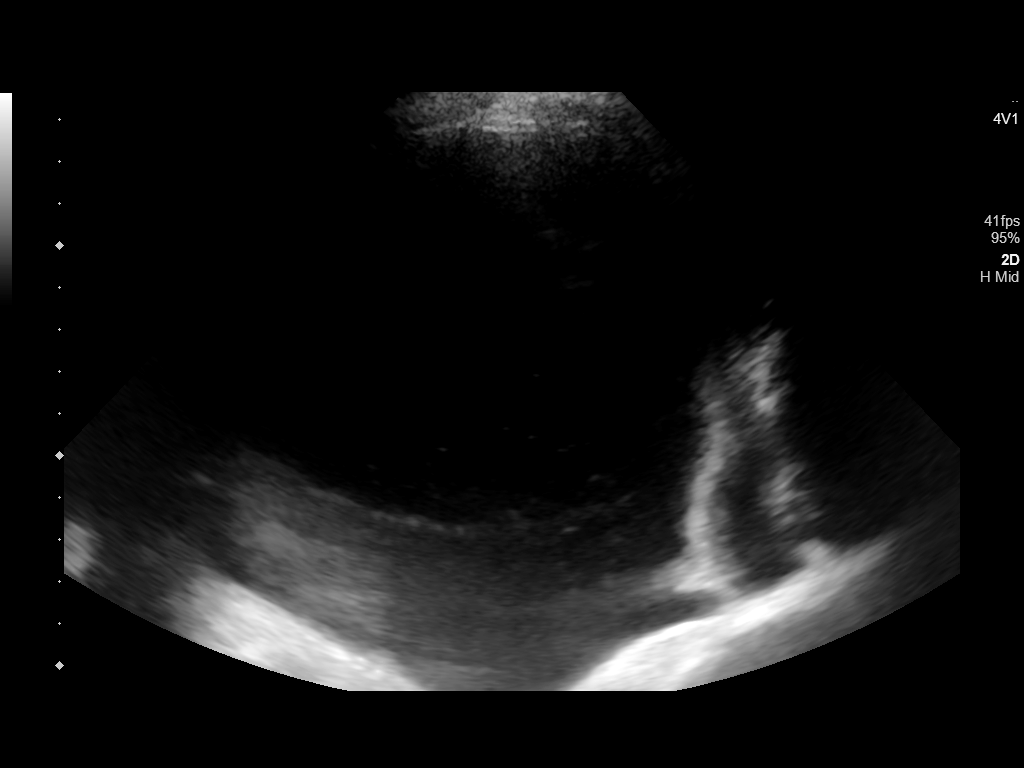
[im 6/6]
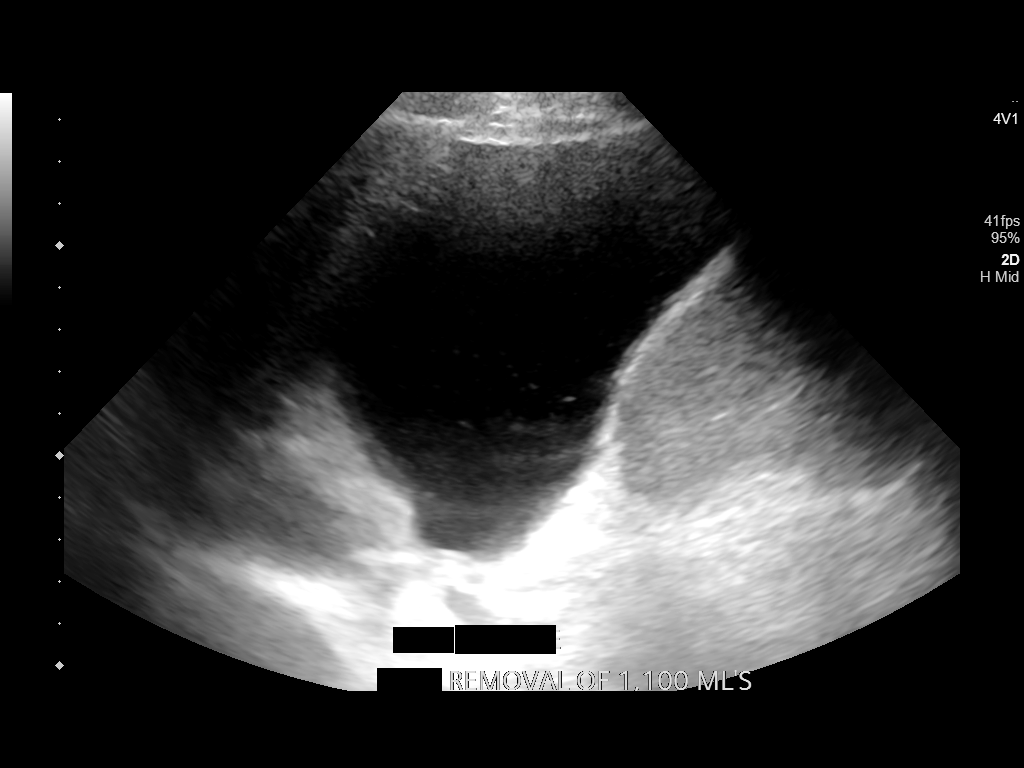

[6 of 6 positions shown; findings below may reference images not displayed]

EXAM:
ULTRASOUND GUIDED LEFT THORACENTESIS

MEDICATIONS:
20 mL 1% lidocaine.

COMPLICATIONS:
None immediate.

PROCEDURE:
An ultrasound guided thoracentesis was thoroughly discussed with the
patient and questions answered. The benefits, risks, alternatives
and complications were also discussed. The patient understands and
wishes to proceed with the procedure. Written consent was obtained.

Ultrasound was performed to localize and mark an adequate pocket of
fluid in the left chest. The area was then prepped and draped in the
normal sterile fashion. 1% Lidocaine was used for local anesthesia.
Under ultrasound guidance a 6 Fr Safe-T-Centesis catheter was
introduced. Thoracentesis was performed. The catheter was removed
and a dressing applied.
FINDINGS: A total of approximately 1.1 L of pink milky fluid was removed.
Samples were sent to the laboratory as requested by the clinical
team.
IMPRESSION: Successful ultrasound guided left thoracentesis yielding 1.1 L of
pleural fluid.

Read by Maso, Morini

## 2019-05-07 IMAGING — DX DG CHEST 1V
1 series · 1 of 1 positions shown · non-contrast
Comparison: Multiple prior exams most recent radiograph 03/24/2018.
Most recent CT 03/14/2018

CLINICAL DATA: Shortness of breath.

EXAM:
CHEST  1 VIEW

[chest ap]
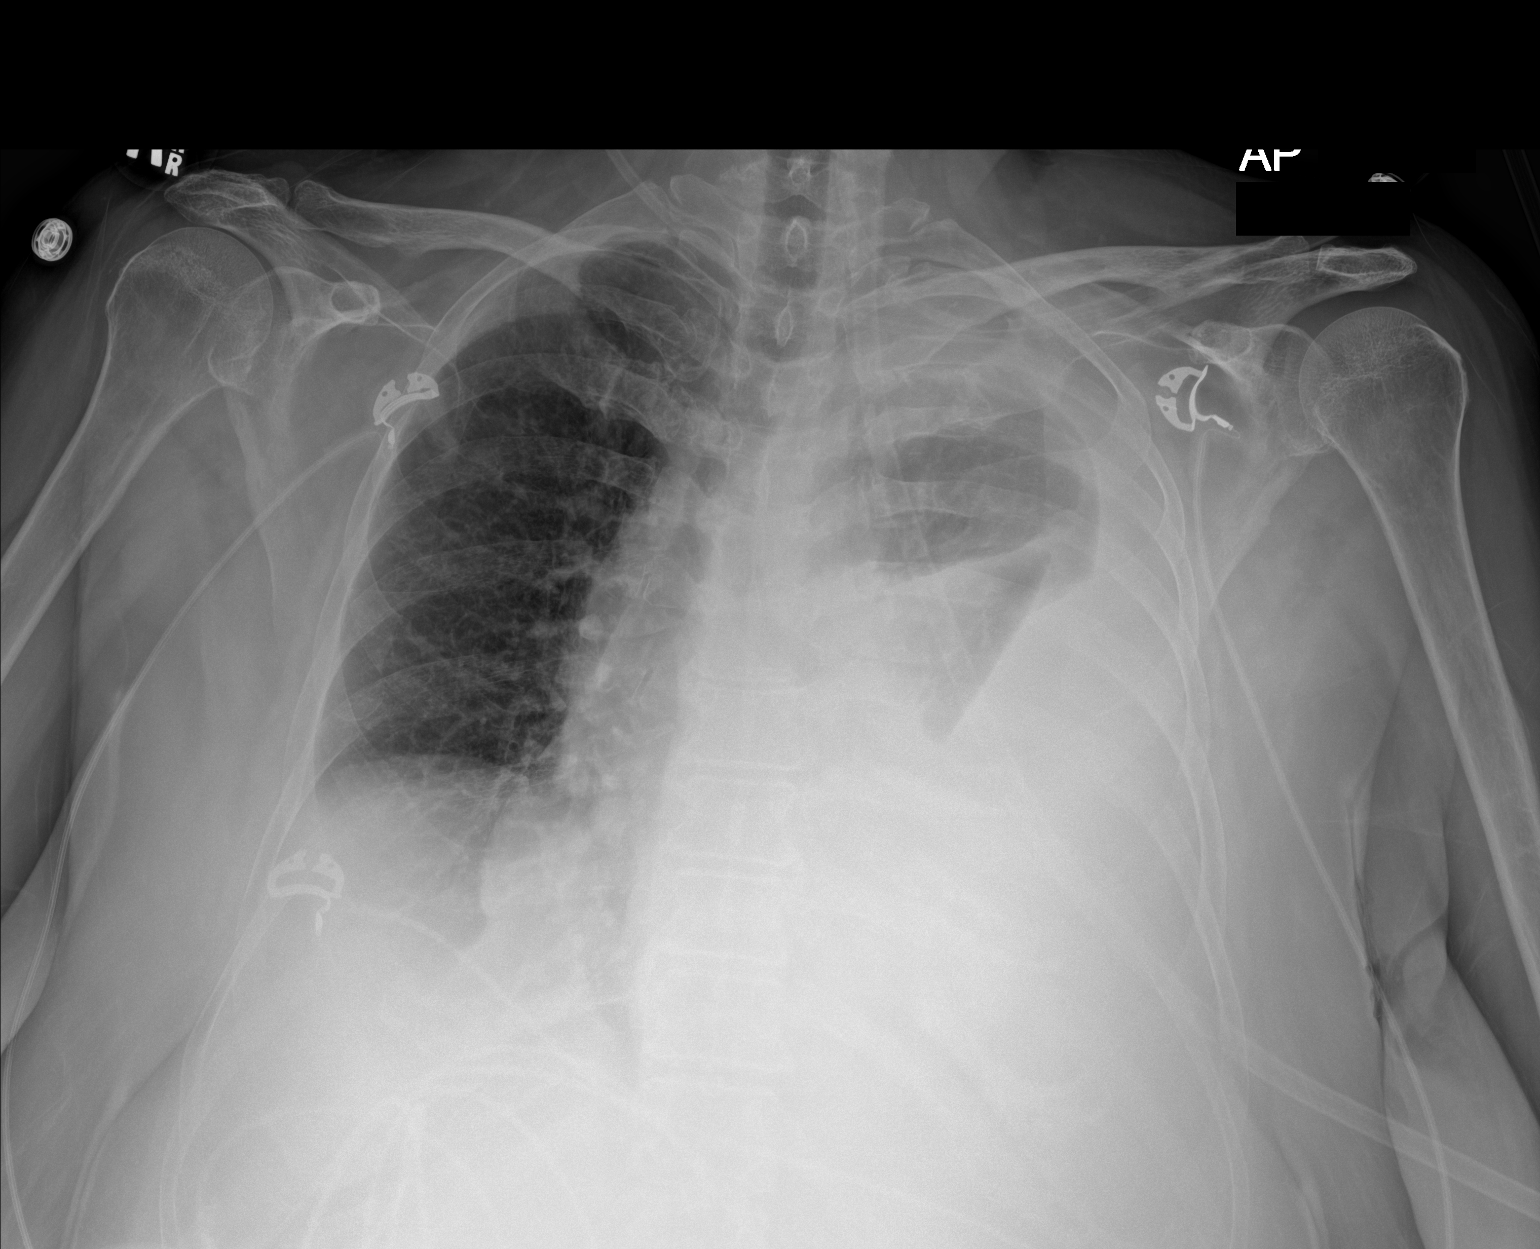

[1 of 1 positions shown; findings below may reference images not displayed]

FINDINGS: Known left pleural effusion has increased in size over the past 2
days, now large in size. Associated compressive atelectasis
throughout the left lung. Mild rightward mediastinal shift versus
rotation. Only a small portion of aerated left perihilar lung.
Hypoventilatory right lung without acute finding. No visualized
pneumothorax.
IMPRESSION: Increased size of known left pleural effusion, now large in size.
This causes mild mass effect in the mediastinum with rightward
mediastinal shift.

## 2019-05-08 IMAGING — US US THORACENTESIS ASP PLEURAL SPACE W/IMG GUIDE
1 series · 3 of 3 positions shown · non-contrast
Comparison: Chest radiograph-03/26/2018

MEDICATIONS:
None.

COMPLICATIONS:
None immediate.

INDICATION: History of left-sided hepatic hydrothorax now with recurrent
symptomatic left-sided pleural effusion. Please perform
ultrasound-guided thoracentesis for therapeutic purposes.

EXAM:
US THORACENTESIS ASP PLEURAL SPACE W/IMG GUIDE
TECHNIQUE: Informed written consent was obtained from the patient after a
discussion of the risks, benefits and alternatives to treatment. A
timeout was performed prior to the initiation of the procedure.

[Series 1: us thoracentesis asp pleural space w/img guide · 3 of 3 slices shown]
[im 1/3]
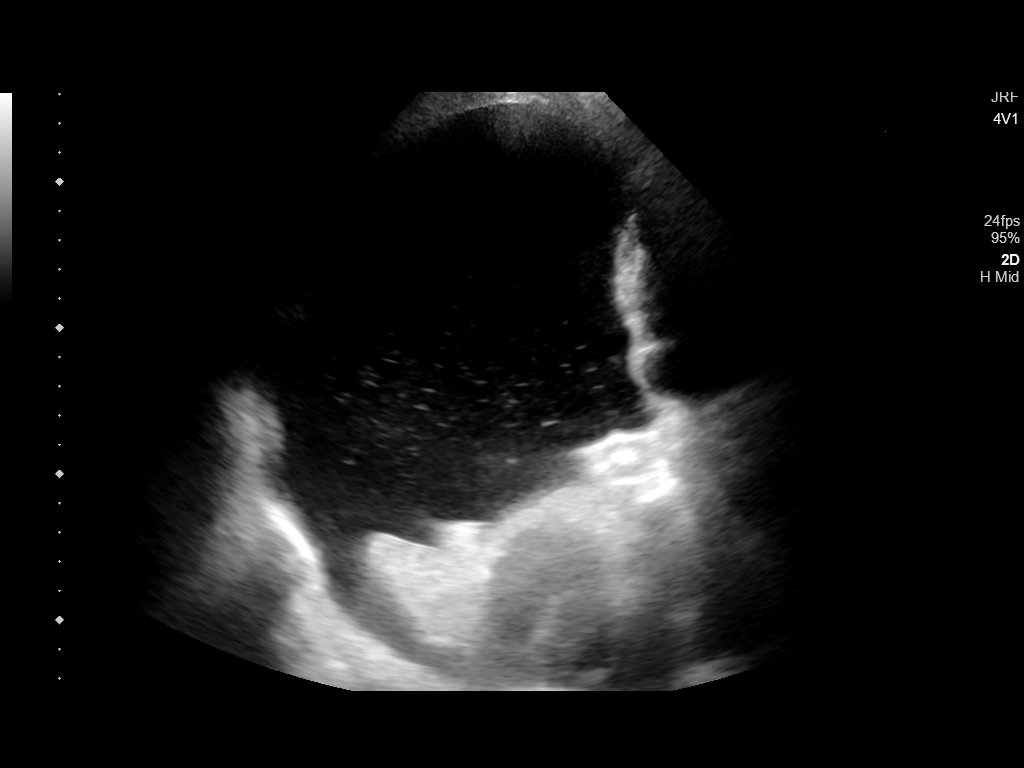
[im 2/3]
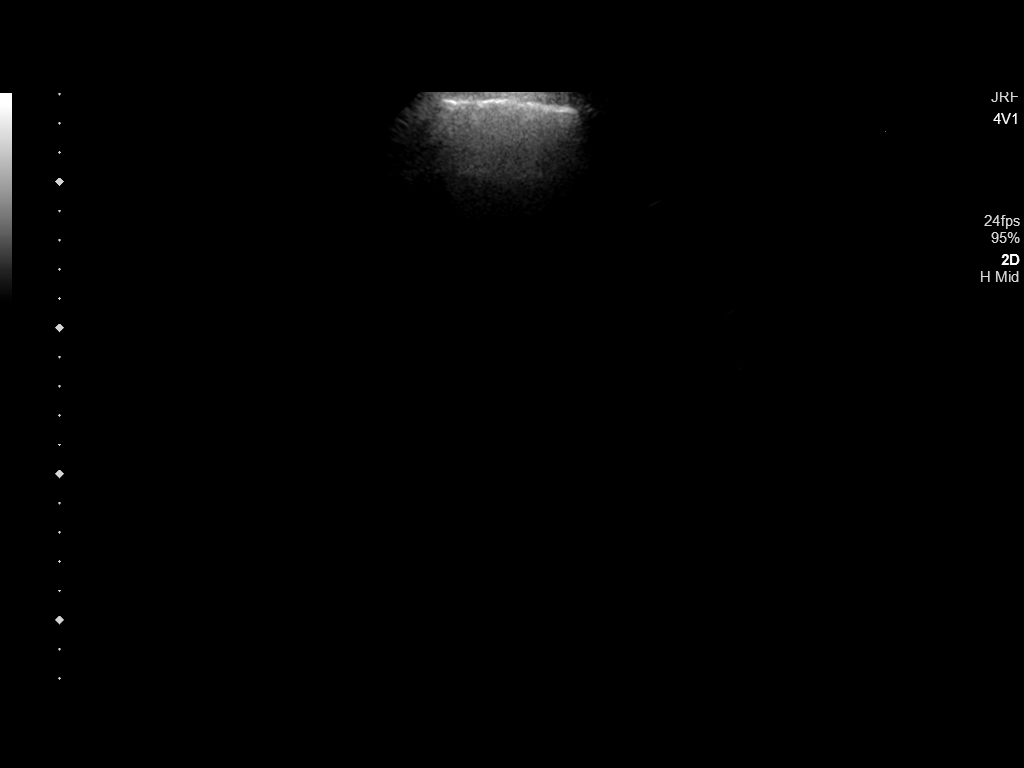
[im 3/3]
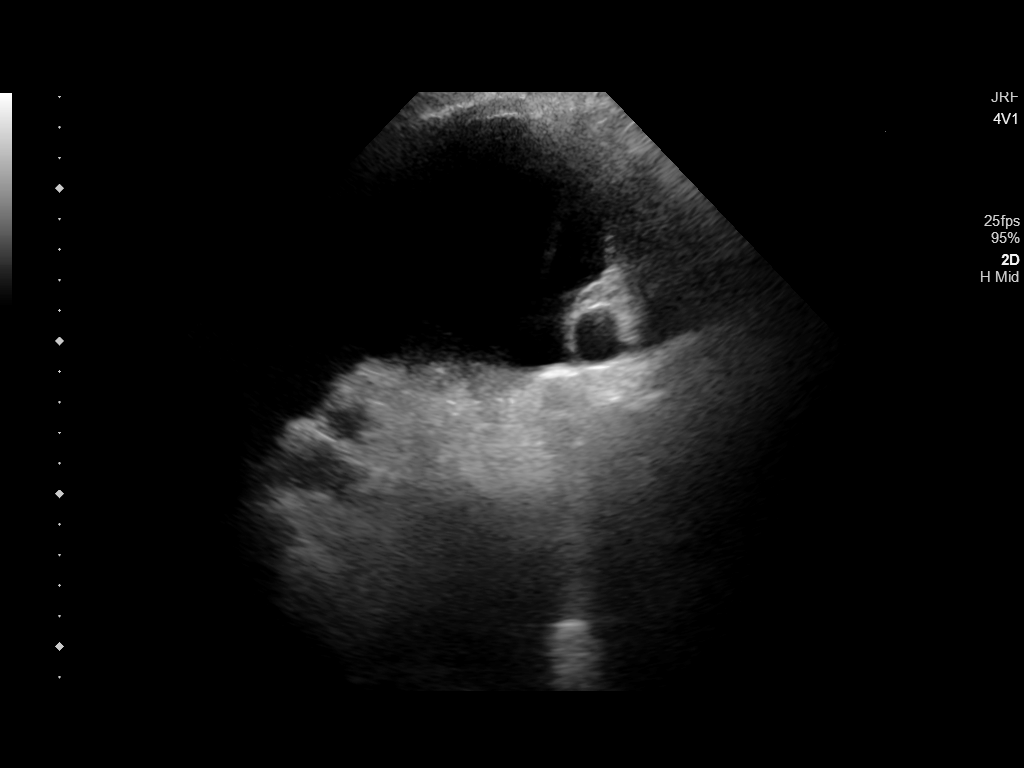

[3 of 3 positions shown; findings below may reference images not displayed]

Initial ultrasound scanning demonstrates a large minimally complex
though predominantly anechoic left-sided pleural effusion. The lower
chest was prepped and draped in the usual sterile fashion. 1%
lidocaine was used for local anesthesia. An ultrasound image was
saved for documentation purposes. An 8 Fr Safe-T-Centesis catheter
was introduced. The thoracentesis was performed. The catheter was
removed and a dressing was applied. The patient tolerated the
procedure well without immediate post procedural complication. The
patient was escorted to have an upright chest radiograph.
FINDINGS: A total of approximately 2.2 liters of serous fluid was removed.
IMPRESSION: Successful ultrasound-guided left sided thoracentesis yielding
liters of pleural fluid.

## 2019-05-08 IMAGING — CR DG CHEST 1V PORT
1 series · 1 of 1 positions shown · non-contrast
Comparison: Chest radiograph-03/26/2018

CLINICAL DATA: History of hepatic hydrothorax post repeat large
volume left-sided thoracentesis.

EXAM:
PORTABLE CHEST 1 VIEW

[dg chest port 1 view]
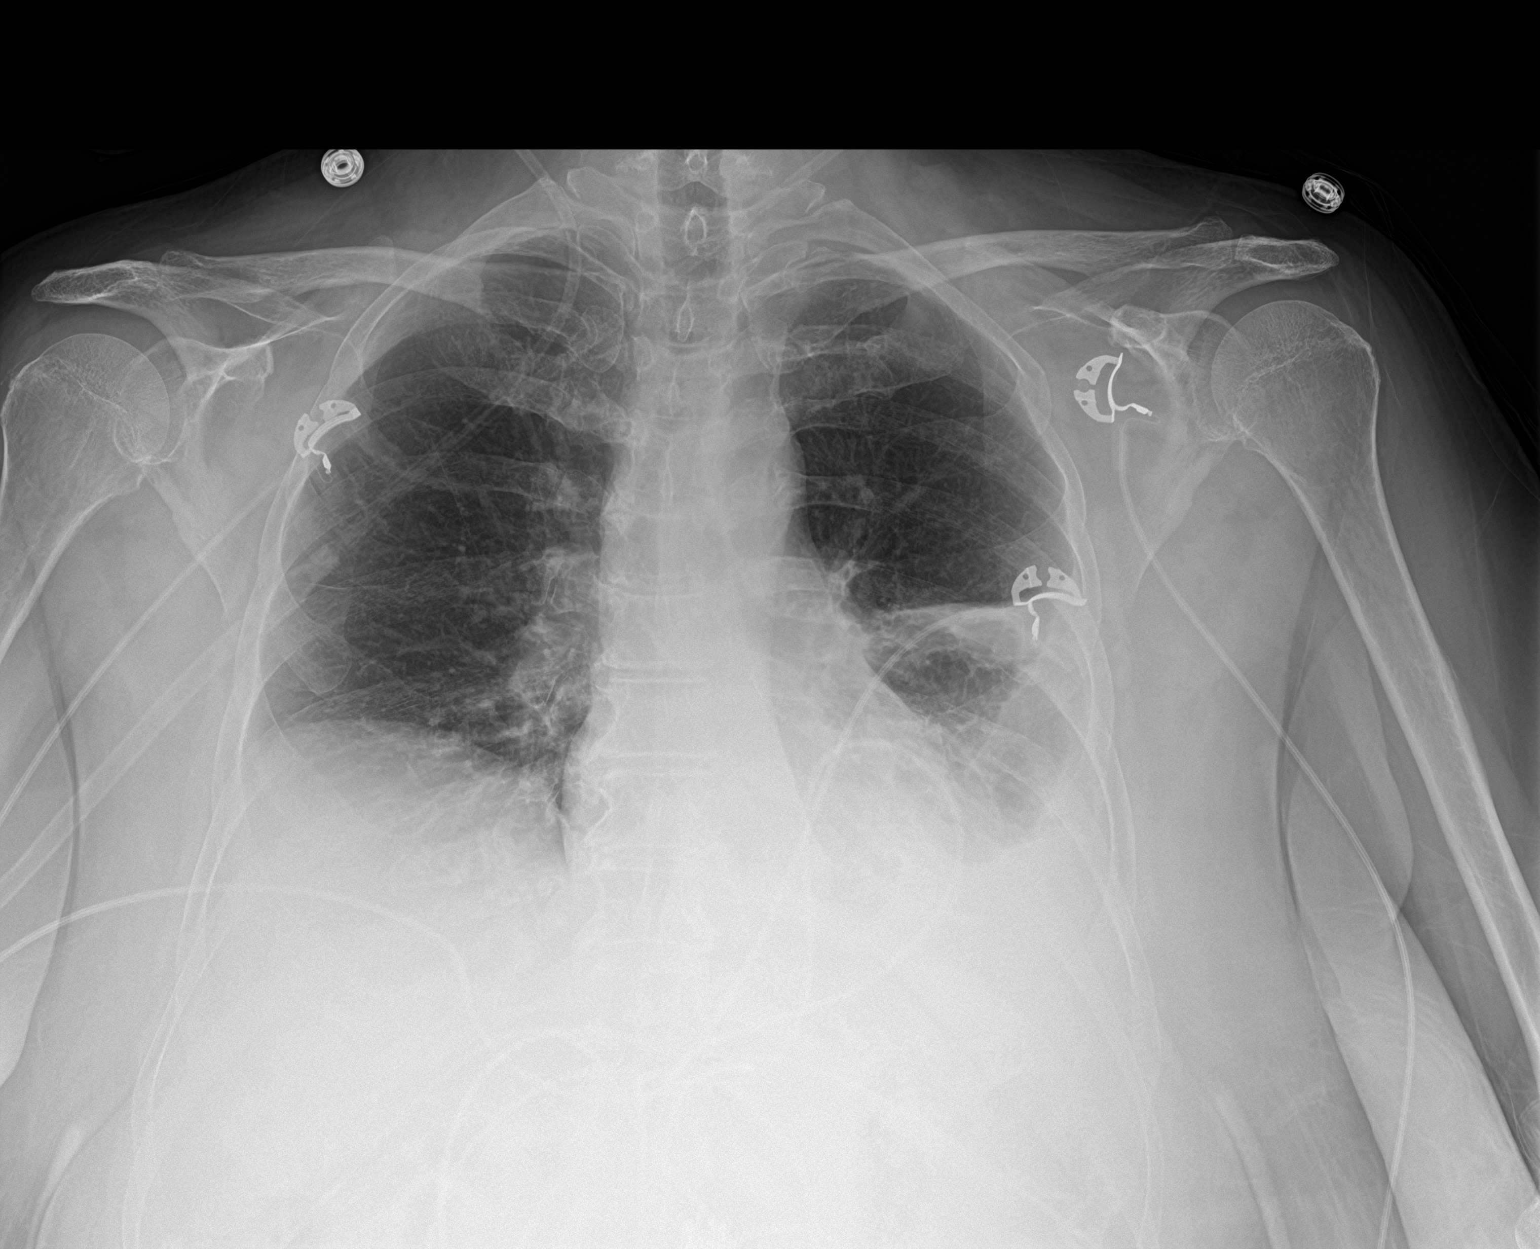

[1 of 1 positions shown; findings below may reference images not displayed]

FINDINGS: Grossly unchanged cardiac silhouette and mediastinal contours.
Interval reduction in persistent moderate size left-sided effusion
post thoracentesis. No pneumothorax. Improved aeration of the left
mid and lower lung with persistent left basilar
heterogeneous/consolidative opacities. No new focal airspace
opacities. No definite right-sided pleural effusion. No evidence of
edema. No acute osseous abnormalities.
IMPRESSION: 1. Interval reduction in persistent moderate size left-sided
effusion post thoracentesis. No pneumothorax.
2. Improved aeration of the left lung base with persistent left
basilar atelectasis.

## 2019-05-09 IMAGING — DX DG CHEST 1V PORT
1 series · 1 of 1 positions shown · non-contrast
Comparison: 03/27/2018, 03/26/2018, 03/24/2018

CLINICAL DATA: Recurrent effusion

EXAM:
PORTABLE CHEST 1 VIEW

[chest ap]
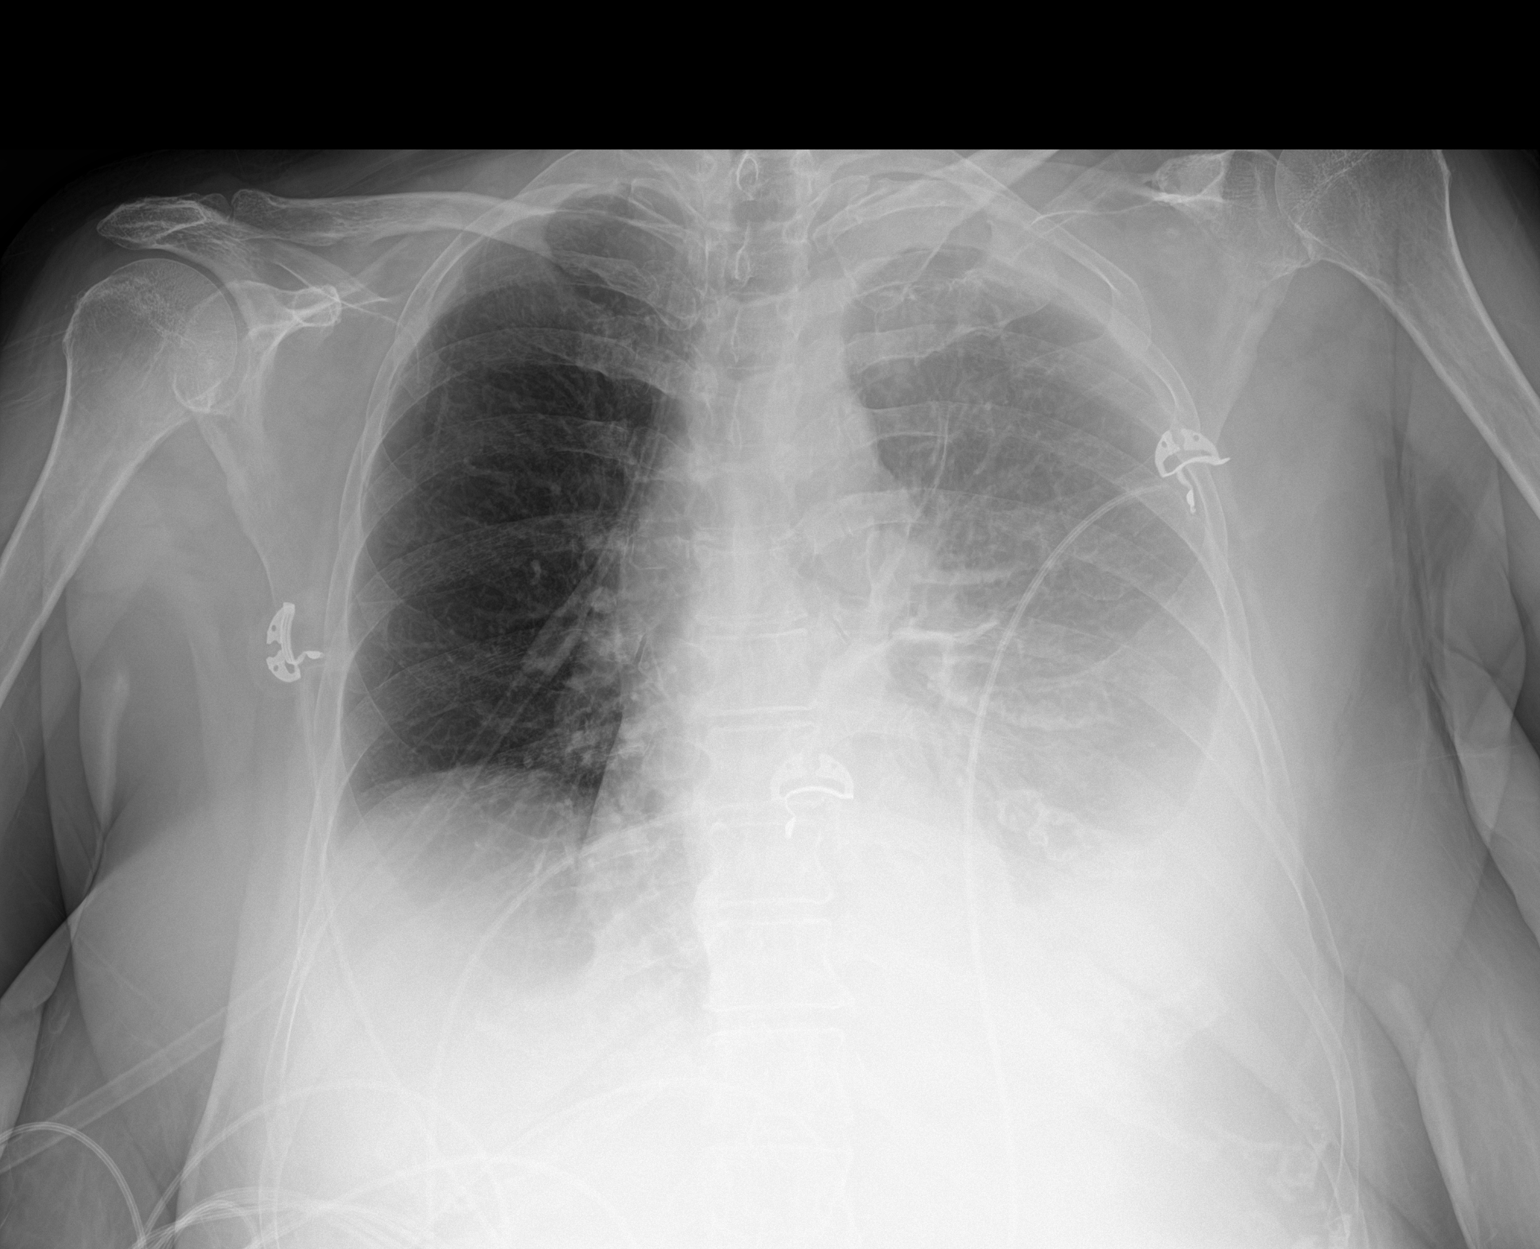

[1 of 1 positions shown; findings below may reference images not displayed]

FINDINGS: Interval diffuse hazy opacity left thorax likely due to layering
effusion, moderate in size. This is increased compared to prior.
Underlying edema not excluded. Stable cardiomediastinal silhouette.
Left basilar airspace disease.
IMPRESSION: Increased moderate left pleural effusion with development of diffuse
hazy opacity in the left thorax which may reflect layering fluid and
or edema. Increased airspace disease at the left base.
